# Patient Record
Sex: Male | Born: 1980 | Race: White | Hispanic: No | Marital: Single | Smoking: Former smoker
Health system: Southern US, Community
[De-identification: ages and names within clinical notes are randomized; demographics above are authoritative.]

## PROBLEM LIST (undated history)

## (undated) DIAGNOSIS — L02415 Cutaneous abscess of right lower limb: Secondary | ICD-10-CM

## (undated) DIAGNOSIS — I5032 Chronic diastolic (congestive) heart failure: Secondary | ICD-10-CM

## (undated) DIAGNOSIS — I10 Essential (primary) hypertension: Secondary | ICD-10-CM

## (undated) HISTORY — DX: Cutaneous abscess of right lower limb: L02.415

## (undated) HISTORY — DX: Morbid (severe) obesity due to excess calories: E66.01

## (undated) HISTORY — DX: Chronic diastolic (congestive) heart failure: I50.32

---
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
## 2001-05-30 ENCOUNTER — Emergency Department (HOSPITAL_COMMUNITY): Admission: EM | Admit: 2001-05-30 | Discharge: 2001-05-30 | Payer: Self-pay | Admitting: Emergency Medicine

## 2004-01-31 ENCOUNTER — Emergency Department (HOSPITAL_COMMUNITY): Admission: EM | Admit: 2004-01-31 | Discharge: 2004-02-01 | Payer: Self-pay | Admitting: Emergency Medicine

## 2008-01-11 ENCOUNTER — Emergency Department (HOSPITAL_COMMUNITY): Admission: EM | Admit: 2008-01-11 | Discharge: 2008-01-11 | Payer: Self-pay | Admitting: Family Medicine

## 2011-06-07 ENCOUNTER — Emergency Department
Admission: EM | Admit: 2011-06-07 | Discharge: 2011-06-07 | Disposition: A | Discharge status: Home / Self Care | Payer: BC Managed Care – PPO | Source: Home / Self Care | Attending: Emergency Medicine | Admitting: Emergency Medicine

## 2011-06-07 DIAGNOSIS — S335XXA Sprain of ligaments of lumbar spine, initial encounter: Secondary | ICD-10-CM

## 2011-06-07 DIAGNOSIS — S39012A Strain of muscle, fascia and tendon of lower back, initial encounter: Secondary | ICD-10-CM

## 2011-06-07 DIAGNOSIS — M545 Low back pain, unspecified: Secondary | ICD-10-CM

## 2011-06-07 HISTORY — DX: Essential (primary) hypertension: I10

## 2011-06-07 MED ORDER — CYCLOBENZAPRINE HCL 10 MG PO TABS: 10.0000 mg | ORAL_TABLET | Freq: Three times a day (TID) | ORAL | Status: AC | PRN

## 2011-06-07 MED ORDER — TRAMADOL HCL 50 MG PO TABS: 50.0000 mg | ORAL_TABLET | Freq: Four times a day (QID) | ORAL | Status: AC | PRN

## 2011-06-07 NOTE — ED Notes (Signed)
Lower back pain started yesterday

## 2011-06-07 NOTE — ED Provider Notes (Signed)
History     CSN: [id number]  Arrival date & time 06/07/11  1625   None     Chief Complaint  Patient presents with  . Back Pain    (Consider location/radiation/quality/duration/timing/severity/associated sxs/prior treatment) HPI The patient presents today with back pain. He is currently about 530 pounds but has lost about 110 pounds in the last year. He is also training to walk a 5K and is currently walking between one and 2 miles per day which is a lot more than usual for him. He is not having any upper respiratory symptoms. He is not having any chest pain or shortness of breath. No left-sided discomfort. Location: mid right back Timing: constant, but worse with certain movements, bending over Description: tight, spasm, sharp Better with: rest, standing straight Trauma: no Bladder/bowel incontinence: no Weakness: no Fever/chills: no Night pain: no Unexplained weight loss: no Cancer/immunosuppression: no PMH of osteoporosis or chronic steroid use:  no   Past Medical History  Diagnosis Date  . Hypertension     History reviewed. No pertinent past surgical history.  History reviewed. No pertinent family history.  History  Substance Use Topics  . Smoking status: Not on file  . Smokeless tobacco: Not on file  . Alcohol Use:       Review of Systems  Allergies  Review of patient's allergies indicates no known allergies.  Home Medications   Current Outpatient Rx  Name Route Sig Dispense Refill  . VITAMIN D 1000 UNITS PO TABS Oral Take 1,000 Units by mouth daily.    . FUROSEMIDE 20 MG PO TABS Oral Take 20 mg by mouth 2 (two) times daily.    Marland Kitchen LOSARTAN POTASSIUM 100 MG PO TABS Oral Take 100 mg by mouth daily.    . CYCLOBENZAPRINE HCL 10 MG PO TABS Oral Take 1 tablet (10 mg total) by mouth 3 (three) times daily as needed for muscle spasms. 24 tablet 0  . TRAMADOL HCL 50 MG PO TABS Oral Take 1 tablet (50 mg total) by mouth every 6 (six) hours as needed for pain. 27  tablet 0    BP 160/100  Pulse 125  Temp(Src) 98.6 F (37 C) (Oral)  Resp 24  Ht 6\' 2"  (1.88 m)  Wt 530 lb (240.406 kg)  BMI 68.05 kg/m2  SpO2 98%  Physical Exam  Nursing note and vitals reviewed. Constitutional: He is oriented to person, place, and time. He appears well-developed and well-nourished.  HENT:  Head: Normocephalic and atraumatic.  Eyes: No scleral icterus.  Neck: Neck supple.  Cardiovascular: Regular rhythm and normal heart sounds.   Pulmonary/Chest: Effort normal and breath sounds normal. No respiratory distress.  Musculoskeletal:       Thoracic back: He exhibits tenderness (Minimal right-sided), pain and spasm (minimal right-sided). He exhibits normal range of motion, no bony tenderness, no swelling and no edema.  Neurological: He is alert and oriented to person, place, and time. He has normal strength and normal reflexes. No sensory deficit.  Skin: Skin is warm and dry. No rash noted.  Psychiatric: He has a normal mood and affect. His speech is normal.    ED Course  Procedures (including critical care time)  Labs Reviewed - No data to display No results found.   1. Pain in lower back   2. Lumbar strain       MDM   I think his back pain is likely due to a muscle strain. This is likely secondary to his increased exercise regimen. We  discussed this but I have encouraged him to continue his exercise regimen because in the long run his. He weight will likely cause more problems. I've given him a prescription for Flexeril and tramadol to take for the next few days. I've also advised he go buy a heating pad and use it. I do not see any cause or reason to get an x-ray at this time, either of his back or on his chest. If his pain continues or is not improving, then we will likely send to sports medicine doctor or get a chest x-ray.  Lily Kocher, MD 06/07/11 [phone]

## 2012-04-19 DIAGNOSIS — E559 Vitamin D deficiency, unspecified: Secondary | ICD-10-CM | POA: Insufficient documentation

## 2012-08-03 DIAGNOSIS — I10 Essential (primary) hypertension: Secondary | ICD-10-CM | POA: Diagnosis present

## 2012-08-03 DIAGNOSIS — E669 Obesity, unspecified: Secondary | ICD-10-CM | POA: Diagnosis present

## 2012-08-20 ENCOUNTER — Other Ambulatory Visit (HOSPITAL_COMMUNITY): Payer: Self-pay | Admitting: Family Medicine

## 2012-08-25 ENCOUNTER — Other Ambulatory Visit (HOSPITAL_COMMUNITY): Payer: BC Managed Care – PPO

## 2012-08-31 ENCOUNTER — Ambulatory Visit (HOSPITAL_COMMUNITY)
Admission: RE | Admit: 2012-08-31 | Discharge: 2012-08-31 | Disposition: A | Discharge status: Home / Self Care | Payer: BC Managed Care – PPO | Source: Ambulatory Visit | Attending: Family Medicine | Admitting: Family Medicine

## 2012-08-31 DIAGNOSIS — Z01818 Encounter for other preprocedural examination: Secondary | ICD-10-CM | POA: Insufficient documentation

## 2012-12-21 ENCOUNTER — Other Ambulatory Visit (HOSPITAL_COMMUNITY): Payer: Self-pay | Admitting: Family Medicine

## 2012-12-21 ENCOUNTER — Emergency Department
Admission: EM | Admit: 2012-12-21 | Discharge: 2012-12-21 | Disposition: A | Discharge status: Home / Self Care | Payer: BC Managed Care – PPO | Source: Home / Self Care | Attending: Family Medicine | Admitting: Family Medicine

## 2012-12-21 ENCOUNTER — Encounter: Payer: Self-pay | Admitting: *Deleted

## 2012-12-21 DIAGNOSIS — R109 Unspecified abdominal pain: Secondary | ICD-10-CM

## 2012-12-21 DIAGNOSIS — R300 Dysuria: Secondary | ICD-10-CM

## 2012-12-21 LAB — POCT URINALYSIS DIP (MANUAL ENTRY)
Blood, UA: NEGATIVE
Ketones, POC UA: NEGATIVE
Protein Ur, POC: NEGATIVE
Spec Grav, UA: 1.025 (ref 1.005–1.03)
pH, UA: 6.5 (ref 5–8)

## 2012-12-21 MED ORDER — CEPHALEXIN 500 MG PO CAPS: 500.0000 mg | ORAL_CAPSULE | Freq: Two times a day (BID) | ORAL | Status: DC

## 2012-12-21 NOTE — ED Notes (Signed)
Pt c/o dysuria and urinary frequency x 1day. Denies fever.  He reports having a UTI vs. Kidney stone x 2wks ago and was treated by his PCP with rapiflow and cephalexin.

## 2012-12-21 NOTE — ED Provider Notes (Signed)
CSN: [id number]     Arrival date & time 12/21/12  1628 History   First MD Initiated Contact with Patient 12/21/12 1656     Chief Complaint  Patient presents with  . Dysuria      HPI Comments: Patient complains of mild dysuria/urgency that started yesterday.  No urethral discharge or testicular pain.  No abdominal pain.  He has had vague intermittent right flank discomfort.  No fevers, chills, and sweats.  Three weeks ago he developed similar but worse symptoms with nocturia.  He visited his PCP, who noted that his urinalysis was normal.  He was started on Keflex 500mg  BID and tamsulosin.  His symptoms improved within one day and resolved within 5 days.  His PCP had recommended an abdominal/pelvic CT scan for further evaluation.  Patient notes that he has modified his diet extensively, and has lost about 50 pounds.  He plans to be out of town for the next 5 days.                                                       Patient is a 32 y.o. male presenting with dysuria. The history is provided by the patient.  Dysuria This is a recurrent problem. The current episode started yesterday. The problem occurs constantly. The problem has been gradually worsening. Pertinent negatives include no chest pain and no abdominal pain. Nothing aggravates the symptoms. Nothing relieves the symptoms. He has tried nothing for the symptoms.    Past Medical History  Diagnosis Date  . Hypertension    History reviewed. No pertinent past surgical history. Family History  Problem Relation Age of Onset  . Bowel Disease Father     colon blockage   History  Substance Use Topics  . Smoking status: Former Games developer  . Smokeless tobacco: Not on file  . Alcohol Use: No    Review of Systems  Cardiovascular: Negative for chest pain.  Gastrointestinal: Negative for abdominal pain.  Genitourinary: Positive for dysuria.  All other systems reviewed and are negative.    Allergies  Sulfa antibiotics  Home Medications    Current Outpatient Rx  Name  Route  Sig  Dispense  Refill  . cephALEXin (KEFLEX) 500 MG capsule   Oral   Take 1 capsule (500 mg total) by mouth 2 (two) times daily.   14 capsule   0   . cholecalciferol (VITAMIN D) 1000 UNITS tablet   Oral   Take 1,000 Units by mouth daily.         . furosemide (LASIX) 20 MG tablet   Oral   Take 20 mg by mouth 2 (two) times daily.         Marland Kitchen losartan (COZAAR) 100 MG tablet   Oral   Take 100 mg by mouth daily.          BP 157/92  Pulse 111  Temp(Src) 98.5 F (36.9 C) (Oral)  Resp 18  Wt 515 lb (233.602 kg)  BMI 66.09 kg/m2  SpO2 100% Physical Exam Nursing notes and Vital Signs reviewed. Appearance:  Patient appears stated age, and in no acute distress.  Patient is morbidly obese (BMI 66.1) Eyes:  Pupils are equal, round, and reactive to light and accomodation.  Extraocular movement is intact.  Conjunctivae are not inflamed  Ears:  Canals normal.  Tympanic membranes normal.  Nose:   Normal turbinates.  Marland Kitchen  Pharynx:  Normal Neck:  Supple.  No adenopathy  Lungs:  Clear to auscultation.  Breath sounds are equal.  Heart:  Regular rate and rhythm without murmurs, rubs, or gallops.  Abdomen:  Nontender without masses or hepatosplenomegaly.  Bowel sounds are present.  No CVA or flank tenderness.  Extremities:  Trace edema.  No calf tenderness Skin:  No rash present.   ED Course  Procedures  none    Labs Reviewed  URINE CULTURE  POCT URINALYSIS DIP (MANUAL ENTRY) negative       MDM   1. Dysuria    Urine culture pending.  Begin Keflex 500mg  BID Increase fluid intake. Red flags discussed. Upon return from trip, followup with PCP for CT scan abdomen/pelvis.    Lattie Haw, MD 12/21/12 [phone]

## 2012-12-23 ENCOUNTER — Ambulatory Visit (HOSPITAL_COMMUNITY): Payer: BC Managed Care – PPO

## 2012-12-23 ENCOUNTER — Telehealth: Payer: Self-pay | Admitting: *Deleted

## 2012-12-23 LAB — URINE CULTURE: Colony Count: NO GROWTH

## 2013-04-12 ENCOUNTER — Emergency Department
Admission: EM | Admit: 2013-04-12 | Discharge: 2013-04-12 | Disposition: A | Discharge status: Home / Self Care | Payer: BC Managed Care – PPO | Source: Home / Self Care | Attending: Emergency Medicine | Admitting: Emergency Medicine

## 2013-04-12 ENCOUNTER — Encounter: Payer: Self-pay | Admitting: Emergency Medicine

## 2013-04-12 DIAGNOSIS — J111 Influenza due to unidentified influenza virus with other respiratory manifestations: Secondary | ICD-10-CM

## 2013-04-12 MED ORDER — OSELTAMIVIR PHOSPHATE 75 MG PO CAPS: ORAL_CAPSULE | ORAL | Status: DC

## 2013-04-12 MED ORDER — PROMETHAZINE-CODEINE 6.25-10 MG/5ML PO SYRP: ORAL_SOLUTION | ORAL | Status: DC

## 2013-04-12 NOTE — ED Notes (Signed)
Tymel c/o onset fever, body aches and productive cough x early this AM. He has had a flu vaccine this season.

## 2013-04-12 NOTE — ED Provider Notes (Signed)
CSN: [id number]     Arrival date & time 04/12/13  0903 History   First MD Initiated Contact with Patient 04/12/13 [phone]     Chief Complaint  Patient presents with  . Fever  . Cough    Patient is a 32 y.o. male presenting with cough. The history is provided by the patient.  Cough FLU  HPI : Flu symptoms for  6 hrs.-- acute onset "feels like Ive been hit by a truck"  Fever to 102 with chills, sweats, myalgias, fatigue, headache. Symptoms are progressively worsening, despite trying OTC fever reducing medicine and rest and fluids. Has decreased appetite, but tolerating some liquids by mouth. No history of recent tick bite.  Review of Systems: Positive for fatigue, mild nasal congestion, mild sore throat, mild swollen anterior neck glands, hacking cough. Negative for acute vision changes, stiff neck, focal weakness, syncope, seizures, respiratory distress, vomiting, diarrhea, GU symptoms, new Rash.  Past Medical History  Diagnosis Date  . Hypertension    History reviewed. No pertinent past surgical history. Family History  Problem Relation Age of Onset  . Bowel Disease Father     colon blockage   History  Substance Use Topics  . Smoking status: Former Smoker    Quit date: 04/13/2011  . Smokeless tobacco: Never Used  . Alcohol Use: No    Review of Systems  Respiratory: Positive for cough.     Allergies  Sulfa antibiotics  Home Medications   Current Outpatient Rx  Name  Route  Sig  Dispense  Refill  . cholecalciferol (VITAMIN D) 1000 UNITS tablet   Oral   Take 1,000 Units by mouth daily.         . furosemide (LASIX) 20 MG tablet   Oral   Take 20 mg by mouth 2 (two) times daily.         Marland Kitchen losartan (COZAAR) 100 MG tablet   Oral   Take 100 mg by mouth daily.         Marland Kitchen oseltamivir (TAMIFLU) 75 MG capsule      Starting today, take 1 capsule by mouth twice a day for 5 days.   10 capsule   0   . promethazine-codeine (PHENERGAN WITH CODEINE) 6.25-10 MG/5ML  syrup      Take 1-2 teaspoons every 4-6 hours as needed for cough. May cause drowsiness.   120 mL   0    BP 145/81  Pulse 116  Temp(Src) 101.2 F (38.4 C) (Oral)  Resp 16  Ht 6\' 3"  (1.905 m)  Wt 490 lb (222.263 kg)  BMI 61.25 kg/m2  SpO2 97% Physical Exam  Nursing note and vitals reviewed. Constitutional: He appears well-developed and well-nourished.  Non-toxic appearance. He appears ill (very fatigued, but no cardiorespiratory distress). No distress.  HENT:  Head: Normocephalic and atraumatic.  Right Ear: Tympanic membrane and external ear normal.  Left Ear: Tympanic membrane and external ear normal.  Nose: Rhinorrhea present.  Mouth/Throat: Mucous membranes are normal. Posterior oropharyngeal erythema (mild redness ) present. No oropharyngeal exudate.  Eyes: Conjunctivae are normal. Right eye exhibits no discharge. Left eye exhibits no discharge. No scleral icterus.  Neck: Neck supple.  Cardiovascular: Normal rate, regular rhythm and normal heart sounds.   Pulmonary/Chest: Breath sounds normal. No stridor. No respiratory distress. He has no wheezes. He has no rales. He exhibits tenderness (mild, diffuse chest wall tenderness, intercostal muscles, but no rib or sternal tenderness).  Abdominal: Soft. There is no tenderness.  Musculoskeletal: He exhibits  no edema.  Lymphadenopathy:    He has cervical adenopathy (mild shoddy anterior cervical nodes).  Neurological: He is alert.  Skin: Skin is warm and intact. No rash noted. He is diaphoretic.  Psychiatric: He has a normal mood and affect.    ED Course  Procedures (including critical care time) Labs Review Labs Reviewed - No data to display Imaging Review No results found.  EKG Interpretation    Date/Time:    Ventricular Rate:    PR Interval:    QRS Duration:   QT Interval:    QTC Calculation:   R Axis:     Text Interpretation:              MDM   1. Influenza with respiratory manifestations    He has  classical influenza by history and exam. He declined doing flu testing, and I agree that since we are in the middle of a influenza epidemic, treat with Tamiflu 75 twice a day x5 days. Other symptomatic care discussed. Fever reduction methods discussed. Promethazine with codeine, 1 or 2 teaspoons at bedtime when necessary severe cough. Note written to excuse from work for 5 days. Clinically, there is no sign of bacterial infection, but red flags discussed, and advised followup here or with PCP if not improving in 7 days, sooner if worse or new symptoms  Precautions discussed. Red flags discussed. Questions invited and answered. Patient voiced understanding and agreement.     Lajean Manes, MD 04/12/13 [phone]

## 2013-05-02 DIAGNOSIS — Z6841 Body Mass Index (BMI) 40.0 and over, adult: Secondary | ICD-10-CM | POA: Insufficient documentation

## 2016-05-12 DIAGNOSIS — J029 Acute pharyngitis, unspecified: Secondary | ICD-10-CM | POA: Insufficient documentation

## 2019-07-07 ENCOUNTER — Other Ambulatory Visit: Payer: Self-pay | Admitting: Family Medicine

## 2019-07-07 ENCOUNTER — Other Ambulatory Visit (HOSPITAL_COMMUNITY): Payer: Self-pay | Admitting: Family Medicine

## 2019-07-07 DIAGNOSIS — R1903 Right lower quadrant abdominal swelling, mass and lump: Secondary | ICD-10-CM

## 2019-07-07 DIAGNOSIS — L0291 Cutaneous abscess, unspecified: Secondary | ICD-10-CM

## 2019-07-08 ENCOUNTER — Ambulatory Visit (HOSPITAL_COMMUNITY): Payer: Self-pay

## 2019-07-08 ENCOUNTER — Encounter (HOSPITAL_COMMUNITY): Payer: Self-pay

## 2019-10-26 ENCOUNTER — Other Ambulatory Visit: Payer: Self-pay | Admitting: Family Medicine

## 2019-10-26 DIAGNOSIS — R140 Abdominal distension (gaseous): Secondary | ICD-10-CM

## 2019-11-08 ENCOUNTER — Ambulatory Visit
Admission: RE | Admit: 2019-11-08 | Discharge: 2019-11-08 | Disposition: A | Discharge status: Home / Self Care | Payer: BC Managed Care – PPO | Source: Ambulatory Visit | Attending: Family Medicine | Admitting: Family Medicine

## 2019-11-08 DIAGNOSIS — R140 Abdominal distension (gaseous): Secondary | ICD-10-CM

## 2019-11-08 MED ORDER — IOPAMIDOL (ISOVUE-300) INJECTION 61%: 125.0000 mL | Freq: Once | INTRAVENOUS | Status: DC | PRN

## 2020-08-06 DIAGNOSIS — L0390 Cellulitis, unspecified: Secondary | ICD-10-CM | POA: Diagnosis present

## 2020-08-30 DIAGNOSIS — E781 Pure hyperglyceridemia: Secondary | ICD-10-CM | POA: Insufficient documentation

## 2020-08-30 DIAGNOSIS — E8881 Metabolic syndrome: Secondary | ICD-10-CM | POA: Insufficient documentation

## 2021-05-29 ENCOUNTER — Encounter (HOSPITAL_COMMUNITY): Payer: Self-pay

## 2021-05-29 ENCOUNTER — Emergency Department (HOSPITAL_COMMUNITY): Payer: Managed Care, Other (non HMO)

## 2021-05-29 ENCOUNTER — Inpatient Hospital Stay (HOSPITAL_COMMUNITY)
Admission: EM | Admit: 2021-05-29 | Discharge: 2021-06-03 | DRG: 291 | Disposition: A | Discharge status: Home / Self Care | Payer: Managed Care, Other (non HMO) | Source: Ambulatory Visit | Attending: Internal Medicine | Admitting: Internal Medicine

## 2021-05-29 ENCOUNTER — Encounter (HOSPITAL_COMMUNITY): Payer: Managed Care, Other (non HMO)

## 2021-05-29 DIAGNOSIS — R32 Unspecified urinary incontinence: Secondary | ICD-10-CM | POA: Diagnosis present

## 2021-05-29 DIAGNOSIS — Z8249 Family history of ischemic heart disease and other diseases of the circulatory system: Secondary | ICD-10-CM

## 2021-05-29 DIAGNOSIS — I10 Essential (primary) hypertension: Secondary | ICD-10-CM | POA: Diagnosis not present

## 2021-05-29 DIAGNOSIS — Z87891 Personal history of nicotine dependence: Secondary | ICD-10-CM

## 2021-05-29 DIAGNOSIS — R0602 Shortness of breath: Secondary | ICD-10-CM

## 2021-05-29 DIAGNOSIS — Z6841 Body Mass Index (BMI) 40.0 and over, adult: Secondary | ICD-10-CM | POA: Diagnosis not present

## 2021-05-29 DIAGNOSIS — J9601 Acute respiratory failure with hypoxia: Secondary | ICD-10-CM | POA: Diagnosis present

## 2021-05-29 DIAGNOSIS — Z79899 Other long term (current) drug therapy: Secondary | ICD-10-CM | POA: Diagnosis not present

## 2021-05-29 DIAGNOSIS — I517 Cardiomegaly: Secondary | ICD-10-CM

## 2021-05-29 DIAGNOSIS — I110 Hypertensive heart disease with heart failure: Principal | ICD-10-CM | POA: Diagnosis present

## 2021-05-29 DIAGNOSIS — R609 Edema, unspecified: Secondary | ICD-10-CM | POA: Diagnosis not present

## 2021-05-29 DIAGNOSIS — Z20822 Contact with and (suspected) exposure to covid-19: Secondary | ICD-10-CM | POA: Diagnosis present

## 2021-05-29 DIAGNOSIS — Z9989 Dependence on other enabling machines and devices: Secondary | ICD-10-CM | POA: Diagnosis not present

## 2021-05-29 DIAGNOSIS — Z882 Allergy status to sulfonamides status: Secondary | ICD-10-CM

## 2021-05-29 DIAGNOSIS — R7303 Prediabetes: Secondary | ICD-10-CM | POA: Diagnosis present

## 2021-05-29 DIAGNOSIS — E669 Obesity, unspecified: Secondary | ICD-10-CM | POA: Diagnosis present

## 2021-05-29 DIAGNOSIS — R7989 Other specified abnormal findings of blood chemistry: Secondary | ICD-10-CM | POA: Diagnosis present

## 2021-05-29 DIAGNOSIS — I5031 Acute diastolic (congestive) heart failure: Secondary | ICD-10-CM | POA: Diagnosis present

## 2021-05-29 DIAGNOSIS — G4733 Obstructive sleep apnea (adult) (pediatric): Secondary | ICD-10-CM | POA: Diagnosis present

## 2021-05-29 DIAGNOSIS — I739 Peripheral vascular disease, unspecified: Secondary | ICD-10-CM | POA: Diagnosis present

## 2021-05-29 DIAGNOSIS — E8770 Fluid overload, unspecified: Secondary | ICD-10-CM | POA: Diagnosis present

## 2021-05-29 LAB — CBC WITH DIFFERENTIAL/PLATELET
Abs Immature Granulocytes: 0.03 K/uL (ref 0.00–0.07)
Basophils Absolute: 0 K/uL (ref 0.0–0.1)
Basophils Relative: 0 %
Eosinophils Absolute: 0.1 K/uL (ref 0.0–0.5)
Eosinophils Relative: 2 %
HCT: 46.3 % (ref 39.0–52.0)
Hemoglobin: 13.9 g/dL (ref 13.0–17.0)
Immature Granulocytes: 0 %
Lymphocytes Relative: 15 %
Lymphs Abs: 1.3 K/uL (ref 0.7–4.0)
MCH: 29.2 pg (ref 26.0–34.0)
MCHC: 30 g/dL (ref 30.0–36.0)
MCV: 97.3 fL (ref 80.0–100.0)
Monocytes Absolute: 0.5 K/uL (ref 0.1–1.0)
Monocytes Relative: 7 %
Neutro Abs: 6.3 K/uL (ref 1.7–7.7)
Neutrophils Relative %: 76 %
Platelets: 239 K/uL (ref 150–400)
RBC: 4.76 MIL/uL (ref 4.22–5.81)
RDW: 16 % — ABNORMAL HIGH (ref 11.5–15.5)
WBC: 8.2 K/uL (ref 4.0–10.5)
nRBC: 0 % (ref 0.0–0.2)

## 2021-05-29 LAB — COMPREHENSIVE METABOLIC PANEL WITH GFR
ALT: 55 U/L — ABNORMAL HIGH (ref 0–44)
AST: 35 U/L (ref 15–41)
Albumin: 3.2 g/dL — ABNORMAL LOW (ref 3.5–5.0)
Alkaline Phosphatase: 69 U/L (ref 38–126)
Anion gap: 3 — ABNORMAL LOW (ref 5–15)
BUN: 18 mg/dL (ref 6–20)
CO2: 35 mmol/L — ABNORMAL HIGH (ref 22–32)
Calcium: 8.6 mg/dL — ABNORMAL LOW (ref 8.9–10.3)
Chloride: 101 mmol/L (ref 98–111)
Creatinine, Ser: 0.68 mg/dL (ref 0.61–1.24)
GFR, Estimated: >60 mL/min
Glucose, Bld: 109 mg/dL — ABNORMAL HIGH (ref 70–99)
Potassium: 4.2 mmol/L (ref 3.5–5.1)
Sodium: 139 mmol/L (ref 135–145)
Total Bilirubin: 0.5 mg/dL (ref 0.3–1.2)
Total Protein: 6.8 g/dL (ref 6.5–8.1)

## 2021-05-29 LAB — HIV ANTIBODY (ROUTINE TESTING W REFLEX): HIV Screen 4th Generation wRfx: NONREACTIVE

## 2021-05-29 LAB — RESP PANEL BY RT-PCR (FLU A&B, COVID) ARPGX2
Influenza A by PCR: NEGATIVE
Influenza B by PCR: NEGATIVE
SARS Coronavirus 2 by RT PCR: NEGATIVE

## 2021-05-29 LAB — TROPONIN I (HIGH SENSITIVITY)
Troponin I (High Sensitivity): 10 ng/L (ref <18)
Troponin I (High Sensitivity): 10 ng/L

## 2021-05-29 LAB — BRAIN NATRIURETIC PEPTIDE: B Natriuretic Peptide: 23 pg/mL (ref 0.0–100.0)

## 2021-05-29 LAB — D-DIMER, QUANTITATIVE: D-Dimer, Quant: 0.9 ug/mL-FEU — ABNORMAL HIGH (ref 0.00–0.50)

## 2021-05-29 MED ORDER — HYDRALAZINE HCL 25 MG PO TABS: 25.0000 mg | ORAL_TABLET | Freq: Four times a day (QID) | ORAL | Status: DC | PRN

## 2021-05-29 MED ORDER — ACETAMINOPHEN 325 MG PO TABS
650.0000 mg | ORAL_TABLET | Freq: Four times a day (QID) | ORAL | Status: DC | PRN
  Administered 2021-05-30: 650 mg via ORAL
  Filled 2021-05-29: qty 2

## 2021-05-29 MED ORDER — ENOXAPARIN SODIUM 40 MG/0.4ML IJ SOSY
40.0000 mg | PREFILLED_SYRINGE | Freq: Two times a day (BID) | INTRAMUSCULAR | Status: DC
  Administered 2021-05-29 – 2021-05-31 (×4): 40 mg via SUBCUTANEOUS
  Filled 2021-05-29 (×4): qty 0.4

## 2021-05-29 MED ORDER — ACETAMINOPHEN 650 MG RE SUPP: 650.0000 mg | Freq: Four times a day (QID) | RECTAL | Status: DC | PRN

## 2021-05-29 MED ORDER — SENNOSIDES-DOCUSATE SODIUM 8.6-50 MG PO TABS: 1.0000 | ORAL_TABLET | Freq: Every evening | ORAL | Status: DC | PRN

## 2021-05-29 MED ORDER — ONDANSETRON HCL 4 MG PO TABS: 4.0000 mg | ORAL_TABLET | Freq: Four times a day (QID) | ORAL | Status: DC | PRN

## 2021-05-29 MED ORDER — LOSARTAN POTASSIUM 50 MG PO TABS
100.0000 mg | ORAL_TABLET | Freq: Every day | ORAL | Status: DC
  Administered 2021-05-30 – 2021-06-03 (×5): 100 mg via ORAL
  Filled 2021-05-29: qty 2
  Filled 2021-05-29: qty 4
  Filled 2021-05-29 (×3): qty 2

## 2021-05-29 MED ORDER — ONDANSETRON HCL 4 MG/2ML IJ SOLN: 4.0000 mg | Freq: Four times a day (QID) | INTRAMUSCULAR | Status: DC | PRN

## 2021-05-29 MED ORDER — FUROSEMIDE 10 MG/ML IJ SOLN
60.0000 mg | Freq: Once | INTRAMUSCULAR | Status: AC
  Administered 2021-05-29: 60 mg via INTRAVENOUS
  Filled 2021-05-29: qty 8

## 2021-05-29 MED ORDER — FUROSEMIDE 10 MG/ML IJ SOLN
60.0000 mg | Freq: Two times a day (BID) | INTRAMUSCULAR | Status: DC
  Administered 2021-05-29 – 2021-06-03 (×10): 60 mg via INTRAVENOUS
  Filled 2021-05-29: qty 6
  Filled 2021-05-29 (×2): qty 8
  Filled 2021-05-29 (×2): qty 6
  Filled 2021-05-29: qty 8
  Filled 2021-05-29 (×4): qty 6

## 2021-05-29 MED ORDER — FUROSEMIDE 10 MG/ML IJ SOLN: 60.0000 mg | Freq: Two times a day (BID) | INTRAMUSCULAR | Status: DC

## 2021-05-29 NOTE — ED Notes (Signed)
Pt bed linens wets from displaced purewick; pt gown and linens changed and new purewick placed; pt encouraged to call for assistance if needed

## 2021-05-29 NOTE — ED Triage Notes (Signed)
Pt BIBA from dr office where he was satting in the 80s on room air. Seen 1 week ago for [facility], covid/flu neg. Reports increasing SHOB while ambulating and lying flat. Noticed increased fluid in abdomen and legs. Takes 20mg  of lasix daily. Denies chest pain.  BP 170/90 Hx HTN HR 90 SpO2 80s RA 97% 3L [place]

## 2021-05-29 NOTE — Hospital Course (Addendum)
41 year old male with super morbid obesity BMI 84, OSA on cpap-compliant,history of hypertension presented hospital with shortness of breath and hypoxia with worsening swelling of his lower extremities and weight gain.  He was seen in his PCPs office and was noted to be hypoxic with pulse ox of 80% on room air complained of orthopnea.  In the ED, patient was noted to be hypertensive up to 205/113, oxygen saturation improved to 91 to 100% on 3 L nasal cannula. CXR showed cardiomegaly with interstitial densities.  EKG showed sinus tachycardia.  Lasix IV was given and patient was admitted to hospital for further evaluation and treatment.  The following conditions were addressed during hospitalization,

## 2021-05-29 NOTE — ED Provider Notes (Signed)
Emergency Department Provider Note   I have reviewed the triage vital signs and the nursing notes.   HISTORY  Chief Complaint Shortness of Breath   HPI Willie Steele is a 41 y.o. male with PMH of HTN and obesity presents to the ED with SOB and hypoxemia.  Patient arrives by EMS from his PCP office.  He arrived there and was found to be 79% on room air.  Patient states that he has no prior history of heart failure.  He has been taking Lasix since he was in his 62s for occasional fluid in the legs but that over the past week his legs and abdomen have swollen significantly.  He states they feel tense and hard.  He is short of breath even with minimal ambulation. Today he found he could not fit into his car due to his increased girth. Denies fever/chills. EMS started on 3L  and transported to the ED.   Patient does note that over the past several days he has had some intermittent "electric" type pain radiating from the center of his chest. Pain is not worse with deep breathing, exertion, or other activities. No active pain at this time.    Past Medical History:  Diagnosis Date   Hypertension     Review of Systems  Constitutional: No fever/chills Eyes: No visual changes. ENT: No sore throat. Cardiovascular: Denies pleuritic chest pain. Positive B/L LE edema and abdominal swelling.  Respiratory: Positive shortness of breath. Gastrointestinal: No abdominal pain.  No nausea, no vomiting.  No diarrhea.  No constipation. Genitourinary: Negative for dysuria. Musculoskeletal: Negative for back pain. Skin: Negative for rash. Neurological: Negative for headaches, focal weakness or numbness.   ____________________________________________   PHYSICAL EXAM:  VITAL SIGNS: ED Triage Vitals  Enc Vitals Group     BP 05/29/21 1458 (!) 119/112     Pulse Rate 05/29/21 1458 (!) 106     Resp 05/29/21 1458 (!) 21     Temp 05/29/21 1458 98.6 F (37 C)     Temp Source 05/29/21 1458 Oral      SpO2 05/29/21 1458 100 %     Weight 05/29/21 1455 (!) 680 lb (308.4 kg)     Height 05/29/21 1455 6\' 3"  (1.905 m)   Constitutional: Alert and oriented. Well appearing and in no acute distress. Eyes: Conjunctivae are normal.  Head: Atraumatic. Nose: No congestion/rhinnorhea. Mouth/Throat: Mucous membranes are moist.  Neck: No stridor.  Cardiovascular: Tachycardia. Good peripheral circulation. Grossly normal heart sounds.   Respiratory: Normal respiratory effort.  No retractions. Lungs distant with crackles at the bases.  Gastrointestinal: Soft and nontender. Positive distension with pitting edema to the abdomen.  Musculoskeletal: No gross deformities of extremities. Bilateral, severe pitting edema in the legs.  Neurologic:  Normal speech and language. Skin:  Skin is warm, dry and intact. No rash noted.   ____________________________________________   LABS (all labs ordered are listed, but only abnormal results are displayed)  Labs Reviewed  COMPREHENSIVE METABOLIC PANEL - Abnormal; Notable for the following components:      Result Value   CO2 35 (*)    Glucose, Bld 109 (*)    Calcium 8.6 (*)    Albumin 3.2 (*)    ALT 55 (*)    Anion gap 3 (*)    All other components within normal limits  CBC WITH DIFFERENTIAL/PLATELET - Abnormal; Notable for the following components:   RDW 16.0 (*)    All other components within normal limits  D-DIMER, QUANTITATIVE - Abnormal; Notable for the following components:   D-Dimer, Quant 0.90 (*)    All other components within normal limits  COMPREHENSIVE METABOLIC PANEL - Abnormal; Notable for the following components:   CO2 34 (*)    Glucose, Bld 112 (*)    Calcium 8.5 (*)    Total Protein 6.3 (*)    Albumin 3.0 (*)    ALT 47 (*)    All other components within normal limits  CBC - Abnormal; Notable for the following components:   MCHC 29.2 (*)    RDW 16.0 (*)    All other components within normal limits  LIPID PANEL - Abnormal; Notable  for the following components:   HDL 25 (*)    All other components within normal limits  RESP PANEL BY RT-PCR (FLU A&B, COVID) ARPGX2  BRAIN NATRIURETIC PEPTIDE  HIV ANTIBODY (ROUTINE TESTING W REFLEX)  BRAIN NATRIURETIC PEPTIDE  HEMOGLOBIN A1C  TSH  TROPONIN I (HIGH SENSITIVITY)  TROPONIN I (HIGH SENSITIVITY)   ____________________________________________  EKG   EKG Interpretation  Date/Time:  Wednesday May 29 2021 14:57:46 EST Ventricular Rate:  104 PR Interval:  197 QRS Duration: 114 QT Interval:  360 QTC Calculation: 474 R Axis:   50 Text Interpretation: Sinus tachycardia Borderline prolonged PR interval Borderline intraventricular conduction delay Low voltage, precordial leads Nonspecific T abnormalities, lateral leads Similar to 2005 tracing Confirmed by Nanda Quinton [phone]) on 05/29/2021 3:36:28 PM        ____________________________________________  RADIOLOGY  DG Chest 1 View  Result Date: 05/29/2021 CLINICAL DATA:  Shortness of breath.  Lower extremity swelling. EXAM: CHEST  1 VIEW COMPARISON:  01/31/2004 FINDINGS: The cardiac silhouette is mildly enlarged. There is pulmonary vascular congestion with diffusely increased interstitial markings in both lungs. No focal airspace consolidation, large pleural effusion, or pneumothorax is identified. IMPRESSION: Cardiomegaly and bilateral interstitial densities, likely edema Electronically Signed   By: Logan Bores M.D.   On: 05/29/2021 15:58    ____________________________________________   PROCEDURES  Procedure(s) performed:   Procedures  CRITICAL CARE Performed by: Margette Fast Total critical care time: 35 minutes Critical care time was exclusive of separately billable procedures and treating other patients. Critical care was necessary to treat or prevent imminent or life-threatening deterioration. Critical care was time spent personally by me on the following activities: development of treatment plan  with patient and/or surrogate as well as nursing, discussions with consultants, evaluation of patient's response to treatment, examination of patient, obtaining history from patient or surrogate, ordering and performing treatments and interventions, ordering and review of laboratory studies, ordering and review of radiographic studies, pulse oximetry and re-evaluation of patient's condition.  Nanda Quinton, MD Emergency Medicine  ____________________________________________   INITIAL IMPRESSION / ASSESSMENT AND PLAN / ED COURSE  Pertinent labs & imaging results that were available during my care of the patient were reviewed by me and considered in my medical decision making (see chart for details).   This patient is Presenting for Evaluation of SOB, which does require a range of treatment options, and is a complaint that involves a high risk of morbidity and mortality.  The Differential Diagnoses include ACS, PE, CHF, pulmonary edema, CAP, COVID/Flu.  Critical Interventions-    Medications  enoxaparin (LOVENOX) injection 40 mg (40 mg Subcutaneous Given 05/29/21 1925)  acetaminophen (TYLENOL) tablet 650 mg (has no administration in time range)    Or  acetaminophen (TYLENOL) suppository 650 mg (has no administration in time range)  senna-docusate (  Senokot-S) tablet 1 tablet (has no administration in time range)  ondansetron (ZOFRAN) tablet 4 mg (has no administration in time range)    Or  ondansetron (ZOFRAN) injection 4 mg (has no administration in time range)  furosemide (LASIX) injection 60 mg (60 mg Intravenous Given 05/29/21 2046)  losartan (COZAAR) tablet 100 mg (has no administration in time range)  hydrALAZINE (APRESOLINE) tablet 25 mg (has no administration in time range)  furosemide (LASIX) injection 60 mg (60 mg Intravenous Given 05/29/21 1750)    Reassessment after intervention: Good diuresis to start in the ED.    I did obtain Additional Historical Information from EMS and  wife at bedside.  I decided to review pertinent External Data, and in summary no ECHO or heart cath in our system.  Tele-health visits noted in the [facility] system.    Clinical Laboratory Tests Ordered, included D dimer is slightly elevated. Unfortunately, patient is beyond the weight limit for any CT scanner here at [facility].   Kidney functions within normal limits.  No hypokalemia.  Troponin is normal.  COVID and flu testing negative.  Radiologic Tests Ordered, included CXR. I independently interpreted the images and agree with radiology interpretation.   Cardiac Monitor Tracing which shows Sinus tachycardia.   Social Determinants of Health Risk patient is a former smoker.   Consult complete with Hospitalist  Discussed patient's case with [facility] to request admission. Patient and family (if present) updated with plan. Care transferred to [facility] service.   Medical Decision Making: Summary:  Patient presents to the emergency department with new oxygen requirement.  He has pitting edema in the legs up to the abdomen.  Pulmonary edema pattern on chest x-ray.  D-dimer slightly elevated.  Called by radiology tech saying that patient exceeds the weight limit for any CT scanner in this hospital or at [facility].  Overall, suspicion for PE is lower although patient does have multiple risk factors for this.  Discussed with the hospitalist.  Plan for aggressive diuresis and reassess need for CT.    Reevaluation with update and discussion with patient and wife. Updated on labs and plan for admit.   Disposition: admit  ____________________________________________  FINAL CLINICAL IMPRESSION(S) / ED DIAGNOSES  Final diagnoses:  Acute respiratory failure with hypoxia ([place])    Note:  This document was prepared using Dragon voice recognition software and may include unintentional dictation errors.  Nanda Quinton, MD, [facility] Emergency Medicine    Dnaiel Voller, [facility], MD 05/30/21 [phone]

## 2021-05-29 NOTE — H&P (Signed)
History and Physical    Patient: Willie Steele ENI:[id number] DOB: December 29, 1980 DOA: 05/29/2021 DOS: the patient was seen and examined on 05/29/2021 PCP: Tracey Harries, MD  Patient coming from: Home, from PCP office  Chief Complaint:  Chief Complaint  Patient presents with   Shortness of Breath    HPI: 41 year old male with super morbid obesity BMI 84, OSA on cpap-compliant,history of hypertension on losartan/lasix and no other known medical disease presented to the ED with shortness of breath and hypoxia.  Patient has been having dyspnea on exertion, worsening swelling in legs up to abdomen for more than a week along with significant weight gain. He reports he got sick on 2/5 and since then he feels he is getting more fluid in his leg, lungs and weight is worsening.He was seen by PCP while saturating 80% on room air.  He was seen a week ago for similar issue with shortness of breath.  More symptomatic with exertion and on laying flat.  He takes Lasix 20 mg daily.  Denies chest pain on arrival. He symptoms are worse in night too and some chest pain/discomfort.  In the ED,initially hypertensive up to 205/113, saturation improved to 91 to 100% on 3 L nasal cannula.IRW:[id number],P/J interstitial densities.D-dimer 0.9, is above weight limit for CT scan.  EKG personally reviewed showed sinus tachycardia, previous to similar EKG in 2005.  ABG pending, troponin negative x1, Lasix 60 mg ordered.He is former smoker- 10 yr x 0.5ppd. On my exam he is not in distress,comfortable, mother at bedside. He says his wt around 675 lb around Jan 15, and had intentional lost 75 lb last year, and is on discussion for Bariatric surgery at [facility].  Patient denies any focal weakness nausea vomiting abdominal pain, chest pain, fever, chills.  Review of Systems: As mentioned in the history of present illness. All other systems reviewed and are negative. Past Medical History:  Diagnosis Date   Hypertension    History  reviewed. No pertinent surgical history. Social History:  reports that he quit smoking about 10 years ago. His smoking use included cigarettes. He has never used smokeless tobacco. He reports current alcohol use. He reports that he does not use drugs.  Allergies  Allergen Reactions   Sulfa Antibiotics     Family History  Problem Relation Age of Onset   Bowel Disease Father        colon blockage    Prior to Admission medications   Medication Sig Start Date End Date Taking? Authorizing Provider  cholecalciferol (VITAMIN D) 1000 UNITS tablet Take 1,000 Units by mouth daily.    [provider]  furosemide (LASIX) 20 MG tablet Take 20 mg by mouth 2 (two) times daily.    [provider]  losartan (COZAAR) 100 MG tablet Take 100 mg by mouth daily.    [provider]  oseltamivir (TAMIFLU) 75 MG capsule Starting today, take 1 capsule by mouth twice a day for 5 days. 04/12/13   Lajean Manes, MD  promethazine-codeine [facility] WITH CODEINE) 6.25-10 MG/5ML syrup Take 1-2 teaspoons every 4-6 hours as needed for cough. May cause drowsiness. 04/12/13   Lajean Manes, MD    Physical Exam: Vitals:   05/29/21 1458 05/29/21 1530 05/29/21 1600 05/29/21 1615  BP: (!) 119/112 (!) 204/113 (!) 148/98 (!) 163/95  Pulse: (!) 106 (!) 108 (!) 102 100  Resp: (!) 21 (!) 22 19 20   Temp: 98.6 F (37 C)     TempSrc: Oral     SpO2:  100% 100% 96% 99%  Weight:      Height:       General exam: AA0X3,Morbidly obese, pleasant HEENT:Oral mucosa moist, Ear/Nose WNL grossly, dentition normal. Respiratory system: bilaterally diminished BS, no use of accessory muscle Cardiovascular system: S1 & S2 +, No JVD,. Gastrointestinal system: Abdomen soft, significant abdominal obesity with pitting edema  Nervous System:Alert, awake, moving extremities and grossly nonfocal Extremities: Bilateral lower extremity with chronic lymphedema, edema Skin: No rashes,no icterus. MSK: Normal muscle  bulk,tone, power   Data Reviewed: EKG independently reviewed, chest x-ray reviewed and shows cardiomegaly with interstitial edema Labs reviewed Recent Labs  Lab 05/29/21 1526  HGB 13.9  HCT 46.3  WBC 8.2  PLT 239    Recent Labs  Lab 05/29/21 1526  NA 139  K 4.2  CL 101  CO2 35*  GLUCOSE 109*  BUN 18  CREATININE 0.68  CALCIUM 8.6*    Assessment and Plan:  Acute respiratory failure with hypoxia Dyspnea on exertion/Orthopnea Fluid overload and weight gain w/ worsening leg edema: Patient presentation and exam consistent with fluid overload, in the setting of morbid obesity, sleep apnea-rule out right heart dysfunction, given his recent flulike illness rule out cardiomyopathy.  Doing well on 3 L .Bicarb elevated indicating component of hypercapnia VS LASIX  use. we will continue aggressive IV Lasix.  D-dimer marginally elevated but he does not fit in CT scan, do not highly suspect a PE-discussed with the patient is in agreement not to start anticoagulation pending duplex of the lower extremity-I consulted and discussed with Dr. Tonia Brooms from pulmonary  and agrees with this plan, but will await further recs.BNP is normal but due to obesity not sensitive. Follow-up ABG, TTE monitor intake output Daily weight.  If weight improves can consider CT chest versus perfusion scan.  Check hemoglobin A1c TSH and lipid panel  Hypertension, benign: Blood pressure uncontrolled resume home losartan, continue diuresis and adjust  Super Morbid obesity BMI 84-He says his wt around 675 lb around Jan 15, and had intentional lost 7b lb last year, and is on discussion for Bariatric surgery at [facility].  I will obtain hemoglobin A1c TSH lipid panel  OSA on CPAP resume home CPAP  Cardiomegaly-obtaining echocardiogram  Advance Care Planning:   Code Status: Full Code  DVT prophylaxis: enoxaparin (LOVENOX) injection 40 mg Start: 05/29/21 1730 Consults: PCCM Family Communication: Mother at the  bedside  Severity of Illness: The appropriate patient status for this patient is INPATIENT. Inpatient status is judged to be reasonable and necessary in order to provide the required intensity of service to ensure the patient's safety. The patient's presenting symptoms, physical exam findings, and initial radiographic and laboratory data in the context of their chronic comorbidities is felt to place them at high risk for further clinical deterioration. Furthermore, it is not anticipated that the patient will be medically stable for discharge from the hospital within 2 midnights of admission.   * I certify that at the point of admission it is my clinical judgment that the patient will require inpatient hospital care spanning beyond 2 midnights from the point of admission due to high intensity of service, high risk for further deterioration and high frequency of surveillance required.*  Author: Lanae Boast, MD 05/29/2021 5:26 PM  For on call review [email or web address].

## 2021-05-29 NOTE — Consult Note (Addendum)
NAME:  Willie Steele, MRN:  [id number], DOB:  15-Jan-1981, LOS: 0 ADMISSION DATE:  05/29/2021 CONSULTATION DATE:  05/29/2021 REFERRING MD:  Willie Steele - TRH CHIEF COMPLAINT:  SOB, hypoxia  History of Present Illness:  41 year old man who presented to [facility] ED via EMS from his PCP office for hypoxia and worsening SOB. PMHx significant for HTN, OSA (on CPAP), super morbid obesity (BMI 84).   Patient reports that he had been doing well overall, working on weight loss and investigating bariatric surgery when over Willie last couple of weeks he began to feel more SOB with increasing edema. Seen 1 week PTA for SOB and at that time was COVID/Flu negative. Over Willie last week, patient experienced worsening SOB and orthopnea. Occasional shooting chest pain that is central and short-lived. He noticed that he had significantly increased fluid retention (despite his usual home Lasix) when he found it difficult to get into his car or to ambulate from his apartment to Willie elevator in his building.  Patient presented to his PCP today and was found to be hypoxic with O2 sats 79% on RA. Physical exam demonstrated significant LE edema and SOB with poor air movement. He was subsequently sent to Willie Steele ED for further evaluation. On ED arrival, patient was afebrile, mildly tachycardic to 110, BP 119/112 and O2 sat 97% on 3L [place]. Labs demonstrated normal CBC, normal electrolytes/renal function (Cr 0.68), normal AST/mildly elevated ALT (55), troponin 10, BNP 23. Resp panel negative for COVID/flu. D-dimer 0.9, but low suspicion for PE as etiology of SOB. Unable to obtain CT Chest due to body habitus.  PCCM consulted for assistance with management of SOB and hypoxia.  Pertinent Medical History:  HTN, OSA (on CPAP), super morbid obesity (BMI 84)  Significant Hospital Events: Including procedures, antibiotic start and stop dates in addition to other pertinent events   2/15 - Presented to Willie Steele ED via EMS for hypoxia, SOB.   Interim History /  Subjective:  PCCM consulted for assistance with management of SOB, dyspnea, hypoxia  Objective:  Blood pressure (!) 163/95, pulse 100, temperature 98.6 F (37 C), temperature source Oral, resp. rate 20, height 6\' 3"  (1.905 m), weight (!) 308.4 kg, SpO2 99 %.       No intake or output data in Willie 24 hours ending 05/29/21 1738 Filed Weights   05/29/21 1455  Weight: (!) 308.4 kg   Physical Examination: General: Acute-on-chronically ill-appearing man in NAD. HEENT: /AT, anicteric sclera, PERRL, moist mucous membranes. Neuro: Awake, oriented x 4. Responds to verbal stimuli. Following commands consistently. Moves all 4 extremities spontaneously.  CV: Mildly tachycardic, regular rhythm, no m/g/r. PULM: Breathing even and minimally labored on 3LNC. Lung fields distant due to body habitus. CTAB anteriorly. Diminished at bilateral bases. GI: Morbidly obese. Soft, nontender, nondistended. Normoactive bowel sounds. Extremities: Bilateral symmetric 3+ pitting LE edema noted. Willie: Warm/dry, erythematous discoloration of bilateral LE secondary to PVD/chronic volume overload.  Resolved Hospital Problem List:    Assessment & Plan:  Willie Steele is seen in consultation at Willie request of Dr. Jonathon Steele [facility]) for evaluation and management of SOB, dyspnea and hypoxia.  41 year old man who presented to Willie Steele ED via EMS from his PCP office for hypoxia and worsening SOB. PMHx significant for HTN, OSA (on CPAP), super morbid obesity. Suspect Willie vast majority of his symptoms are related to volume overload at this juncture; less likely PE but unable to obtain CTA Chest due to body habitus. Less likely heart failure in Willie setting of  minimally elevated BNP/troponin. Would focus on aggressive diuresis and volume status optimization.  Acute hypoxemic respiratory insufficiency Gross volume overload OSA, on CPAP History of tobacco abuse - Continue supplemental O2 support - CPAP QHS  - Wean O2 for sat > 90% -  Aggressive diuresis - Monitor strict I&Os - Pulmonary hygiene - Obtain LE dopplers, r/o DVT to further diminish PE suspicion - F/u Echo - Trend troponin  Hypertension Home regimen includes losartan 100mg  daily, Lasix 20mg  BID. - Resume home medications as appropriate - Aggressive diuresis as above  Super morbid obesity Working with bariatric surgery to qualify for weight loss surgery ([facility]) - Continue to prioritize weight loss  Best Practice: (right click and "Reselect all SmartList Selections" daily)   Per Primary Team  Labs:  CBC: Recent Labs  Lab 05/29/21 1526  WBC 8.2  NEUTROABS 6.3  HGB 13.9  HCT 46.3  MCV 97.3  PLT 239   Basic Metabolic Panel: Recent Labs  Lab 05/29/21 1526  NA 139  K 4.2  CL 101  CO2 35*  GLUCOSE 109*  BUN 18  CREATININE 0.68  CALCIUM 8.6*   GFR: Estimated Creatinine Clearance: 302.3 mL/min (by C-G formula based on SCr of 0.68 mg/dL). Recent Labs  Lab 05/29/21 1526  WBC 8.2   Liver Function Tests: Recent Labs  Lab 05/29/21 1526  AST 35  ALT 55*  ALKPHOS 69  BILITOT 0.5  PROT 6.8  ALBUMIN 3.2*   No results for input(s): LIPASE, AMYLASE in Willie last 168 hours. No results for input(s): AMMONIA in Willie last 168 hours.  ABG: No results found for: PHART, PCO2ART, PO2ART, HCO3, TCO2, ACIDBASEDEF, O2SAT   Coagulation Profile: No results for input(s): INR, PROTIME in Willie last 168 hours.  Cardiac Enzymes: No results for input(s): CKTOTAL, CKMB, CKMBINDEX, TROPONINI in Willie last 168 hours.  HbA1C: No results found for: HGBA1C  CBG: No results for input(s): GLUCAP in Willie last 168 hours.  Review of Systems:   Review of systems completed with pertinent positives/negatives outlined in above HPI.  Past Medical History:  He,  has a past medical history of Hypertension.   Surgical History:  History reviewed. No pertinent surgical history.   Social History:   reports that he quit smoking about 10 years ago. His  smoking use included cigarettes. He has never used smokeless tobacco. He reports current alcohol use. He reports that he does not use drugs.   Family History:  His family history includes Bowel Disease in his father.   Allergies: Allergies  Allergen Reactions   Sulfa Antibiotics     Home Medications: Prior to Admission medications   Medication Sig Start Date End Date Taking? Authorizing Provider  cholecalciferol (VITAMIN D) 1000 UNITS tablet Take 1,000 Units by mouth daily.    [provider]  furosemide (LASIX) 20 MG tablet Take 20 mg by mouth 2 (two) times daily.    [provider]  losartan (COZAAR) 100 MG tablet Take 100 mg by mouth daily.    [provider]  promethazine-codeine (PHENERGAN WITH CODEINE) 6.25-10 MG/5ML syrup Take 1-2 teaspoons every 4-6 hours as needed for cough. May cause drowsiness. 04/12/13   10-21-1975, MD    Critical care time: N/A   04/14/13 [facility] 05/29/21 5:38 PM  Please see [email or web address] for pager details.  From 7A-7P if no response, please call [phone] After hours, please call 05/31/21 [phone]   PCCM Attending:   41 yo M, >  600lbs, super morbid obesity, BMI 84, OSA on CPAP, HTN.   BP (!) 149/95    Pulse (!) 116    Temp 98.6 F (37 C) (Oral)    Resp (!) 22    Ht 6\' 3"  (1.905 m)    Wt (!) 308.4 kg    SpO2 98%    BMI 84.99 kg/m   Gen: obese male, comfortable, talking  HENT: Large neck  Heart: RRR, s1 s2 , distant heart tones  Lungs: CTAB, diminished  Abd: Soft, obese, distended Ext: BL chronic changes, chronic edema   Labs: Reviewed  Assessment: Super morbidly obese Volume overloaded Acute hypoxemic respiratory failure Likely has acute on chronic diastolic heart failure Minimally elevated D-dimer, likely related to body habitus Family history of DVT, father  Plan: Dopplers of lower extremity Low suspicion for PE, however doppler pending  Would hold off on  anticoagulation Unable to obtain CTA due to body habitus CPAP nightly IV diuresis with Lasix If he does not get good enough output with twice daily IV Lasix would consider Lasix infusion. Also would consider condom cath to help with urine collection due to body habitus. Agree with echo PCCM will sign off. Please call with any questions or concerns.    , DO [facility] 05/29/2021 6:40 PM

## 2021-05-30 ENCOUNTER — Inpatient Hospital Stay (HOSPITAL_COMMUNITY): Payer: Managed Care, Other (non HMO)

## 2021-05-30 ENCOUNTER — Other Ambulatory Visit: Payer: Self-pay

## 2021-05-30 DIAGNOSIS — I5031 Acute diastolic (congestive) heart failure: Secondary | ICD-10-CM

## 2021-05-30 DIAGNOSIS — R609 Edema, unspecified: Secondary | ICD-10-CM

## 2021-05-30 DIAGNOSIS — R0602 Shortness of breath: Secondary | ICD-10-CM

## 2021-05-30 LAB — ECHOCARDIOGRAM COMPLETE
Area-P 1/2: 4.21 cm2
Height: 75 in
S' Lateral: 4.1 cm
Single Plane A4C EF: 39.2 %
Weight: 10880 oz

## 2021-05-30 LAB — COMPREHENSIVE METABOLIC PANEL
ALT: 47 U/L — ABNORMAL HIGH (ref 0–44)
AST: 26 U/L (ref 15–41)
Albumin: 3 g/dL — ABNORMAL LOW (ref 3.5–5.0)
Alkaline Phosphatase: 63 U/L (ref 38–126)
Anion gap: 6 (ref 5–15)
BUN: 17 mg/dL (ref 6–20)
CO2: 34 mmol/L — ABNORMAL HIGH (ref 22–32)
Calcium: 8.5 mg/dL — ABNORMAL LOW (ref 8.9–10.3)
Chloride: 101 mmol/L (ref 98–111)
Creatinine, Ser: 0.76 mg/dL (ref 0.61–1.24)
GFR, Estimated: >60 mL/min (ref >60)
Glucose, Bld: 112 mg/dL — ABNORMAL HIGH (ref 70–99)
Potassium: 4.1 mmol/L (ref 3.5–5.1)
Sodium: 141 mmol/L (ref 135–145)
Total Bilirubin: 0.3 mg/dL (ref 0.3–1.2)
Total Protein: 6.3 g/dL — ABNORMAL LOW (ref 6.5–8.1)

## 2021-05-30 LAB — CBC
HCT: 44.8 % (ref 39.0–52.0)
Hemoglobin: 13.1 g/dL (ref 13.0–17.0)
MCH: 29.2 pg (ref 26.0–34.0)
MCHC: 29.2 g/dL — ABNORMAL LOW (ref 30.0–36.0)
MCV: 99.8 fL (ref 80.0–100.0)
Platelets: 235 10*3/uL (ref 150–400)
RBC: 4.49 MIL/uL (ref 4.22–5.81)
RDW: 16 % — ABNORMAL HIGH (ref 11.5–15.5)
WBC: 7.5 10*3/uL (ref 4.0–10.5)
nRBC: 0 % (ref 0.0–0.2)

## 2021-05-30 LAB — LIPID PANEL
Cholesterol: 126 mg/dL (ref 0–200)
HDL: 25 mg/dL — ABNORMAL LOW (ref >40)
LDL Cholesterol: 84 mg/dL (ref 0–99)
Total CHOL/HDL Ratio: 5 RATIO
Triglycerides: 85 mg/dL (ref <150)
VLDL: 17 mg/dL (ref 0–40)

## 2021-05-30 LAB — BRAIN NATRIURETIC PEPTIDE: B Natriuretic Peptide: 42.9 pg/mL (ref 0.0–100.0)

## 2021-05-30 NOTE — Progress Notes (Signed)
° °  Echocardiogram 2D Echocardiogram has been performed.  Willie Steele 05/30/2021, 11:40 AM

## 2021-05-30 NOTE — ED Notes (Signed)
Portable equipment called to replace bariatric mattress

## 2021-05-30 NOTE — Progress Notes (Signed)
BLE venous duplex has been completed.  Study limited due to habitus.  Preliminary results given to Ko Olina, RN.   Results can be found under chart review under CV PROC. 05/30/2021 9:27 AM Sharine Cadle RVT, RDMS

## 2021-05-30 NOTE — ED Notes (Signed)
Patient transported to floor at this time.

## 2021-05-30 NOTE — Progress Notes (Signed)
PROGRESS NOTE Willie Steele  ZOX:[id number] DOB: 10-29-1980 DOA: 05/29/2021 PCP: Tracey Harries, MD   Brief Narrative/Hospital Course: 41 year old male with super morbid obesity BMI 84, OSA on cpap-compliant,history of hypertension on losartan/lasix and no other known medical disease presented to the ED with shortness of breath and hypoxia.  Patient has been having dyspnea on exertion, worsening swelling in legs up to abdomen for more than a week along with significant weight gain. He reports he got sick on 2/5 and since then he feels he is getting more fluid in his leg, lungs and weight is worsening.He was seen by PCP while saturating 80% on room air.  He was seen a week ago for similar issue with shortness of breath.  More symptomatic with exertion and on laying flat.  He takes Lasix 20 mg daily.  Denies chest pain on arrival. He symptoms are worse in night too and some chest pain/discomfort.  In the ED,initially hypertensive up to 205/113, saturation improved to 91 to 100% on 3 L nasal cannula.WJX:BJYNWGNFAOZH,Y/Q interstitial densities.D-dimer 0.9, is above weight limit for CT scan.  EKG personally reviewed showed sinus tachycardia, previous to similar EKG in 2005.  ABG pending, troponin negative x1, Lasix 60 mg ordered.He is former smoker- 10 yr x 0.5ppd. On my exam he is not in distress,comfortable, mother at bedside. He says his wt around 675 lb around Jan 15, and had intentional lost 75 lb last year, and is on discussion for Bariatric surgery at [facility].    Subjective: Seen and examined this morning.  Patient reports he feels better today had a good sleep with CPAP.  He feels his legs are less tight and less swollen today  Overnight blood pressure fairly stable heart rate respiration rate stable no fever Lab with a stable renal function Output inaccurate as patient had incontinent episode with leak of purewick-urine output charted 4600  Assessment and Plan:  Acute respiratory failure with  hypoxia Dyspnea on exertion/Orthopnea Fluid overload and weight gain w/ worsening leg edema: Presentation consistent with acute hypoxic respiratory failure in the setting of fluid overload with super morbid obesity, obtaining echocardiogram, duplex to rule out DVT likely has acute on chronic diastolic heart failure.  PCCM input appreciated.  Continue on aggressive IV Lasix 60 every 12 hours UOP >4600 as I/o not accurate since patient also had leakage.  Monitor daily weight, intake output, continue low-salt diet.  Asked nursing to measure weight again today.Net IO Since Admission: -4,600 mL [05/30/21 1042]  Filed Weights   05/29/21 1455  Weight: (!) 308.4 kg     Hypertension, benign: Blood pressure uncontrolled on admission.  Blood pressure now improving continue home losartan and diuresis.    Borderline elevated D-dimer follow-up duplex negative for DVT.Patient does not fit in CT scan but low suspicion for PE.   Super Morbid obesity BMI 84-He says his wt around 675 lb around Jan 15, and had intentional lost 7b lb last year, and is on discussion for Bariatric surgery at [facility].  A1c , tsh pending lipid panel stable.   OSA on CPAP  cont CPAP qhs   Cardiomegaly- pending echocardiogram  DVT prophylaxis: enoxaparin (LOVENOX) injection 40 mg Start: 05/29/21 2000 Code Status:   Code Status: Full Code Family Communication: plan of care discussed with patient at bedside.  Disposition: Currently not medically stable for discharge. Status is: Inpatient Remains inpatient appropriate because: Ongoing management of hypoxia fluid overload  Objective: Vitals last 24 hrs: Vitals:   05/30/21 0600 05/30/21 0700 05/30/21 0800  05/30/21 0900  BP: (!) 158/74 (!) 149/71 (!) 157/86 (!) 141/80  Pulse: 68 65 73 86  Resp: 20 19 18  (!) 21  Temp:      TempSrc:      SpO2: 95% 95% 97% 96%  Weight:      Height:       Weight change:   Physical Examination: General exam: Aa0x3, morbidly obese, pleasant, older  than stated age, weak appearing. HEENT:Oral mucosa moist, Ear/Nose WNL grossly, dentition normal. Respiratory system: bilaterally diminished BS, no use of accessory muscle Cardiovascular system: S1 & S2 +, No JVD,. Gastrointestinal system: Abdomen soft,NT,ND, BS+ Nervous System:Alert, awake, moving extremities and grossly nonfocal Extremities: Chronic lymphedematous lower extremity with edema up to abdomen  Skin: No rashes,no icterus. MSK: Normal muscle bulk,tone, power  Medications reviewed: Scheduled Meds:  enoxaparin (LOVENOX) injection  40 mg Subcutaneous Q12H   furosemide  60 mg Intravenous BID   losartan  100 mg Oral Daily   Continuous Infusions:    Diet Order             Diet 2 gram sodium Room service appropriate? Yes; Fluid consistency: Thin; Fluid restriction: 1200 mL Fluid  Diet effective now                    Intake/Output Summary (Last 24 hours) at 05/30/2021 1042 Last data filed at 05/30/2021 0246 Gross per 24 hour  Intake --  Output 4600 ml  Net -4600 ml   Net IO Since Admission: -4,600 mL [05/30/21 1042]  Wt Readings from Last 3 Encounters:  05/29/21 (!) 308.4 kg  04/12/13 (!) 222.3 kg  12/21/12 (!) 233.6 kg     Unresulted Labs (From admission, onward)     Start     Ordered   06/05/21 0500  Creatinine, serum  (enoxaparin (LOVENOX)    CrCl >/= 30 ml/min)  Weekly,   R     Comments: while on enoxaparin therapy    05/29/21 1725   05/31/21 0500  Basic metabolic panel  Daily,   R     Question:  Specimen collection method  Answer:  Lab=Lab collect   05/30/21 0728   05/31/21 0500  Magnesium  Tomorrow morning,   R       Question:  Specimen collection method  Answer:  Lab=Lab collect   05/30/21 0728   05/29/21 1811  TSH  Once,   STAT        05/29/21 1811   05/29/21 1725  Hemoglobin A1c  Add-on,   AD        05/29/21 1725          Data Reviewed: I have personally reviewed following labs and imaging studies CBC: Recent Labs  Lab 05/29/21 1526  05/30/21 0326  WBC 8.2 7.5  NEUTROABS 6.3  --   HGB 13.9 13.1  HCT 46.3 44.8  MCV 97.3 99.8  PLT 239 235   Basic Metabolic Panel: Recent Labs  Lab 05/29/21 1526 05/30/21 0326  NA 139 141  K 4.2 4.1  CL 101 101  CO2 35* 34*  GLUCOSE 109* 112*  BUN 18 17  CREATININE 0.68 0.76  CALCIUM 8.6* 8.5*   GFR: Estimated Creatinine Clearance: 302.3 mL/min (by C-G formula based on SCr of 0.76 mg/dL). Liver Function Tests: Recent Labs  Lab 05/29/21 1526 05/30/21 0326  AST 35 26  ALT 55* 47*  ALKPHOS 69 63  BILITOT 0.5 0.3  PROT 6.8 6.3*  ALBUMIN 3.2* 3.0*  No results for input(s): LIPASE, AMYLASE in the last 168 hours. No results for input(s): AMMONIA in the last 168 hours. Coagulation Profile: No results for input(s): INR, PROTIME in the last 168 hours. Cardiac Enzymes: No results for input(s): CKTOTAL, CKMB, CKMBINDEX, TROPONINI in the last 168 hours. BNP (last 3 results) No results for input(s): PROBNP in the last 8760 hours. HbA1C: No results for input(s): HGBA1C in the last 72 hours. CBG: No results for input(s): GLUCAP in the last 168 hours. Lipid Profile: Recent Labs    05/30/21 0326  CHOL 126  HDL 25*  LDLCALC 84  TRIG 85  CHOLHDL 5.0   Thyroid Function Tests: No results for input(s): TSH, T4TOTAL, FREET4, T3FREE, THYROIDAB in the last 72 hours. Anemia Panel: No results for input(s): VITAMINB12, FOLATE, FERRITIN, TIBC, IRON, RETICCTPCT in the last 72 hours. Sepsis Labs: No results for input(s): PROCALCITON, LATICACIDVEN in the last 168 hours.  Recent Results (from the past 240 hour(s))  Resp Panel by RT-PCR (Flu A&B, Covid) Nasopharyngeal Swab     Status: None   Collection Time: 05/29/21  3:26 PM   Specimen: Nasopharyngeal Swab; Nasopharyngeal(NP) swabs in vial transport medium  Result Value Ref Range Status   SARS Coronavirus 2 by RT PCR NEGATIVE NEGATIVE Final    Comment: (NOTE) SARS-CoV-2 target nucleic acids are NOT DETECTED.  The SARS-CoV-2  RNA is generally detectable in upper respiratory specimens during the acute phase of infection. The lowest concentration of SARS-CoV-2 viral copies this assay can detect is 138 copies/mL. A negative result does not preclude SARS-Cov-2 infection and should not be used as the sole basis for treatment or other patient management decisions. A negative result may occur with  improper specimen collection/handling, submission of specimen other than nasopharyngeal swab, presence of viral mutation(s) within the areas targeted by this assay, and inadequate number of viral copies(<138 copies/mL). A negative result must be combined with clinical observations, patient history, and epidemiological information. The expected result is Negative.  Fact Sheet for Patients:  BloggerCourse.com  Fact Sheet for Healthcare Providers:  SeriousBroker.it  This test is no t yet approved or cleared by the [place] FDA and  has been authorized for detection and/or diagnosis of SARS-CoV-2 by FDA under an Emergency Use Authorization (EUA). This EUA will remain  in effect (meaning this test can be used) for the duration of the COVID-19 declaration under Section 564(b)(1) of the Act, 21 U.S.C.section 360bbb-3(b)(1), unless the authorization is terminated  or revoked sooner.       Influenza A by PCR NEGATIVE NEGATIVE Final   Influenza B by PCR NEGATIVE NEGATIVE Final    Comment: (NOTE) The Xpert Xpress SARS-CoV-2/FLU/RSV plus assay is intended as an aid in the diagnosis of influenza from Nasopharyngeal swab specimens and should not be used as a sole basis for treatment. Nasal washings and aspirates are unacceptable for Xpert Xpress SARS-CoV-2/FLU/RSV testing.  Fact Sheet for Patients: BloggerCourse.com  Fact Sheet for Healthcare Providers: SeriousBroker.it  This test is not yet approved or cleared by the  [place] FDA and has been authorized for detection and/or diagnosis of SARS-CoV-2 by FDA under an Emergency Use Authorization (EUA). This EUA will remain in effect (meaning this test can be used) for the duration of the COVID-19 declaration under Section 564(b)(1) of the Act, 21 U.S.C. section 360bbb-3(b)(1), unless the authorization is terminated or revoked.  Performed at [facility], [address], [place], [place] [address]     Antimicrobials: Anti-infectives (From admission,  onward)    None      Culture/Microbiology No results found for: SDES, SPECREQUEST, CULT, REPTSTATUS  Other culture-see note   Radiology Studies: DG Chest 1 View  Result Date: 05/29/2021 CLINICAL DATA:  Shortness of breath.  Lower extremity swelling. EXAM: CHEST  1 VIEW COMPARISON:  01/31/2004 FINDINGS: The cardiac silhouette is mildly enlarged. There is pulmonary vascular congestion with diffusely increased interstitial markings in both lungs. No focal airspace consolidation, large pleural effusion, or pneumothorax is identified. IMPRESSION: Cardiomegaly and bilateral interstitial densities, likely edema Electronically Signed   By: Sebastian Ache M.D.   On: 05/29/2021 15:58   VAS [place] LOWER EXTREMITY VENOUS (DVT) (ONLY [facility] & [facility])  Result Date: 05/30/2021  Lower Venous DVT Study Patient Name:  OLUWAFEMI VILLELLA  Date of Exam:   05/30/2021 Medical Rec #: [id number]      Accession #:    [id number] Date of Birth: Sep 29, 1980      Patient Gender: M Patient Age:   38 years Exam Location:  [facility] Procedure:      VAS [place] LOWER EXTREMITY VENOUS (DVT) Referring Phys: JOSHUA LONG --------------------------------------------------------------------------------  Indications: SOB, and Edema.  Limitations: Body habitus (BMI 85) and poor ultrasound/tissue interface. Comparison Study: No previous exams Performing Technologist: Jody Hill RVT, RDMS  Examination Guidelines: A complete evaluation includes  B-mode imaging, spectral Doppler, color Doppler, and power Doppler as needed of all accessible portions of each vessel. Bilateral testing is considered an integral part of a complete examination. Limited examinations for reoccurring indications may be performed as noted. The reflux portion of the exam is performed with the patient in reverse Trendelenburg.  +---------+---------------+---------+-----------+----------+-------------------+  RIGHT     Compressibility Phasicity Spontaneity Properties Thrombus Aging       +---------+---------------+---------+-----------+----------+-------------------+  CFV       Full            Yes       Yes                                         +---------+---------------+---------+-----------+----------+-------------------+  SFJ       Full                                                                  +---------+---------------+---------+-----------+----------+-------------------+  FV Prox   Full            Yes       Yes                                         +---------+---------------+---------+-----------+----------+-------------------+  FV Mid    Full            Yes       Yes                                         +---------+---------------+---------+-----------+----------+-------------------+  FV Distal                 Yes  Yes                    patent by color and                                                              doppler              +---------+---------------+---------+-----------+----------+-------------------+  PFV       Full                                                                  +---------+---------------+---------+-----------+----------+-------------------+  POP       Full            Yes       Yes                                         +---------+---------------+---------+-----------+----------+-------------------+  PTV                                                        Not visualized        +---------+---------------+---------+-----------+----------+-------------------+  PERO                                                       Not visualized       +---------+---------------+---------+-----------+----------+-------------------+   Right Technical Findings: Not visualized segments include posterior tibial and peroneal veins.  +---------+---------------+---------+-----------+----------+--------------+  LEFT      Compressibility Phasicity Spontaneity Properties Thrombus Aging  +---------+---------------+---------+-----------+----------+--------------+  CFV       Full            Yes       Yes                                    +---------+---------------+---------+-----------+----------+--------------+  SFJ       Full                                                             +---------+---------------+---------+-----------+----------+--------------+  FV Prox   Full            Yes       Yes                                    +---------+---------------+---------+-----------+----------+--------------+  FV Mid    Full            Yes       Yes                                    +---------+---------------+---------+-----------+----------+--------------+  FV Distal                                                  Not visualized  +---------+---------------+---------+-----------+----------+--------------+  PFV       Full                                                             +---------+---------------+---------+-----------+----------+--------------+  POP       Full            Yes       Yes                                    +---------+---------------+---------+-----------+----------+--------------+  PTV                                                        Not visualized  +---------+---------------+---------+-----------+----------+--------------+  PERO                                                       Not visualized  +---------+---------------+---------+-----------+----------+--------------+   Left  Technical Findings: Not visualized segments include distal femoral, posterior tibial, and peroneal veins.   Summary: BILATERAL: -No evidence of popliteal cyst, bilaterally. RIGHT: - There is no evidence of deep vein thrombosis in the lower extremity. However, portions of this examination were limited- see technologist comments above.  LEFT: - There is no evidence of deep vein thrombosis in the lower extremity. However, portions of this examination were limited- see technologist comments above.  *See table(s) above for measurements and observations.    Preliminary      LOS: 1 day   Lanae Boastamesh Tezra Mahr, MD [facility]  05/30/2021, 10:42 AM

## 2021-05-31 LAB — BASIC METABOLIC PANEL
Anion gap: 6 (ref 5–15)
BUN: 16 mg/dL (ref 6–20)
CO2: 38 mmol/L — ABNORMAL HIGH (ref 22–32)
Calcium: 8.6 mg/dL — ABNORMAL LOW (ref 8.9–10.3)
Chloride: 95 mmol/L — ABNORMAL LOW (ref 98–111)
Creatinine, Ser: 0.83 mg/dL (ref 0.61–1.24)
GFR, Estimated: >60 mL/min (ref >60)
Glucose, Bld: 107 mg/dL — ABNORMAL HIGH (ref 70–99)
Potassium: 3.9 mmol/L (ref 3.5–5.1)
Sodium: 139 mmol/L (ref 135–145)

## 2021-05-31 LAB — HEMOGLOBIN A1C
Hgb A1c MFr Bld: 5.7 % — ABNORMAL HIGH (ref 4.8–5.6)
Mean Plasma Glucose: 116.89 mg/dL

## 2021-05-31 LAB — TSH: TSH: 1.205 u[IU]/mL (ref 0.350–4.500)

## 2021-05-31 LAB — MAGNESIUM: Magnesium: 2.1 mg/dL (ref 1.7–2.4)

## 2021-05-31 MED ORDER — ENOXAPARIN SODIUM 60 MG/0.6ML IJ SOSY
60.0000 mg | PREFILLED_SYRINGE | Freq: Two times a day (BID) | INTRAMUSCULAR | Status: DC
  Administered 2021-05-31 – 2021-06-03 (×6): 60 mg via SUBCUTANEOUS
  Filled 2021-05-31 (×6): qty 0.6

## 2021-05-31 NOTE — Progress Notes (Signed)
PROGRESS NOTE Willie Steele  QMV:[id number] DOB: [birth year] DOA: 05/29/2021 PCP: Tracey Harries, MD   Brief Narrative/Hospital Course: 41 year old male with super morbid obesity BMI 84, OSA on cpap-compliant,history of hypertension on losartan/lasix and no other known medical disease presented to the ED with shortness of breath and hypoxia.  Patient has been having dyspnea on exertion, worsening swelling in legs up to abdomen for more than a week along with significant weight gain. He reports he got sick on 2/5 and since then he feels he is getting more fluid in his leg, lungs and weight is worsening.He was seen by PCP while saturating 80% on room air.  He was seen a week ago for similar issue with shortness of breath.  More symptomatic with exertion and on laying flat.  He takes Lasix 20 mg daily.  Denies chest pain on arrival. He symptoms are worse in night too and some chest pain/discomfort.  In the ED,initially hypertensive up to 205/113, saturation improved to 91 to 100% on 3 L nasal cannula.MWU:[id number],D/G interstitial densities.D-dimer 0.9, is above weight limit for CT scan.  EKG personally reviewed showed sinus tachycardia, previous to similar EKG in 2005.  ABG pending, troponin negative x1, Lasix 60 mg ordered.He is former smoker- 10 yr x 0.5ppd. On my exam he is not in distress,comfortable, mother at bedside. He says his wt around 675 lb around Jan 15, and had intentional lost 75 lb last year, and is on discussion for Bariatric surgery at [facility].    Subjective: Slept well Needed 02 bleed through CPAP- but was on baby [place].  Otherwise denies any complaints reports he feels much lighter able to move his extremities feels less fluid in his abdomen wall  Assessment and Plan:  Acute respiratory failure with hypoxia Dyspnea on exertion/Orthopnea Fluid overload and weight gain w/ worsening leg edema: Presentation consistent with acute hypoxic respiratory failure in the setting of fluid  overload with super morbid obesity, echo limited but with normal EF, duplex no DVT.  Suspecting acute diastolic CHF and fluid overload.PCCM input appreciated.  Continue on aggressive IV Lasix 60 every 12 hours having good urine output net -8 L see below.  Continue ambulation today PT OT,Monitor daily weight, intake output, continue low-salt diet weight improving but I doubt initial weight to be accurate.Net IO Since Admission: -8,150 mL [05/31/21 0855]  Filed Weights   05/29/21 1455 05/31/21 0500  Weight: (!) 308.4 kg (!) 307.5 kg     Hypertension, benign: Fairly stable continue diuretics and losartan, allow blood pressure on higher side to allow for diuresis   Borderline elevated D-dimer follow-up duplex negative for DVT.Patient does not fit in CT scan but low suspicion for PE.   Super Morbid obesity BMI 84-He says his wt around 675 lb around Jan 15, and had intentional lost 7b lb last year, and is on discussion for Bariatric surgery at [facility].  A1c , tsh pending lipid panel stable.   OSA on CPAP  cont CPAP qhs Prediabetes with A1c 5.7 definitely weight loss will be beneficial Cardiomegaly- echo limited   DVT prophylaxis: enoxaparin (LOVENOX) injection 40 mg Start: 05/29/21 2000 Code Status:   Code Status: Full Code Family Communication: plan of care discussed with patient at bedside.  Disposition: Currently not medically stable for discharge. Status is: Inpatient Remains inpatient appropriate because: Ongoing management of hypoxia fluid overload  Objective: Vitals last 24 hrs: Vitals:   05/31/21 0438 05/31/21 0500 05/31/21 0524 05/31/21 0812  BP:   (!) 151/88 (!) 170/87  Pulse:   72 73  Resp:   17   Temp:   98.3 F (36.8 C)   TempSrc:      SpO2: 94%  97% 99%  Weight:  (!) 307.5 kg    Height:       Weight change: -0.996 kg  Physical Examination: General exam: AAX3, pleasant, older than stated age, weak appearing. HEENT:Oral mucosa moist, Ear/Nose WNL grossly, dentition  normal. Respiratory system: bilaterally diminished, no use of accessory muscle Cardiovascular system: S1 & S2 +, No JVD,. Gastrointestinal system: Abdomen soft,NT,ND,BS+ Nervous System:Alert, awake, moving extremities and grossly nonfocal Extremities: Extensive lower extremity lymphedema with improving edema, edema in abdominal wall and thigh  Skin: No rashes,no icterus. MSK: Normal muscle bulk,tone, power   Medications reviewed: Scheduled Meds:  enoxaparin (LOVENOX) injection  40 mg Subcutaneous Q12H   furosemide  60 mg Intravenous BID   losartan  100 mg Oral Daily   Continuous Infusions:    Diet Order             Diet 2 gram sodium Room service appropriate? Yes; Fluid consistency: Thin; Fluid restriction: 1200 mL Fluid  Diet effective now                    Intake/Output Summary (Last 24 hours) at 05/31/2021 0855 Last data filed at 05/31/2021 0110 Gross per 24 hour  Intake --  Output 3550 ml  Net -3550 ml    Net IO Since Admission: -8,150 mL [05/31/21 0855]  Wt Readings from Last 3 Encounters:  05/31/21 (!) 307.5 kg  04/12/13 (!) 222.3 kg  12/21/12 (!) 233.6 kg     [employer] (From admission, onward)     Start     Ordered   06/05/21 0500  Creatinine, serum  (enoxaparin (LOVENOX)    CrCl >/= 30 ml/min)  Weekly,   R     Comments: while on enoxaparin therapy    05/29/21 1725   05/31/21 0500  Basic metabolic panel  Daily,   R     Question:  Specimen collection method  Answer:  Lab=Lab collect   05/30/21 0728          Data Reviewed: I have personally reviewed following labs and imaging studies CBC: Recent Labs  Lab 05/29/21 1526 05/30/21 0326  WBC 8.2 7.5  NEUTROABS 6.3  --   HGB 13.9 13.1  HCT 46.3 44.8  MCV 97.3 99.8  PLT 239 235    Basic Metabolic Panel: Recent Labs  Lab 05/29/21 1526 05/30/21 0326 05/31/21 0458  NA 139 141 139  K 4.2 4.1 3.9  CL 101 101 95*  CO2 35* 34* 38*  GLUCOSE 109* 112* 107*  BUN 18 17 16   CREATININE 0.68  0.76 0.83  CALCIUM 8.6* 8.5* 8.6*  MG  --   --  2.1    GFR: Estimated Creatinine Clearance: 290.7 mL/min (by C-G formula based on SCr of 0.83 mg/dL). Liver Function Tests: Recent Labs  Lab 05/29/21 1526 05/30/21 0326  AST 35 26  ALT 55* 47*  ALKPHOS 69 63  BILITOT 0.5 0.3  PROT 6.8 6.3*  ALBUMIN 3.2* 3.0*    No results for input(s): LIPASE, AMYLASE in the last 168 hours. No results for input(s): AMMONIA in the last 168 hours. Coagulation Profile: No results for input(s): INR, PROTIME in the last 168 hours. Cardiac Enzymes: No results for input(s): CKTOTAL, CKMB, CKMBINDEX, TROPONINI in the last 168 hours. BNP (last 3 results) No results for input(s):  PROBNP in the last 8760 hours. HbA1C: Recent Labs    05/31/21 0458  HGBA1C 5.7*   CBG: No results for input(s): GLUCAP in the last 168 hours. Lipid Profile: Recent Labs    05/30/21 0326  CHOL 126  HDL 25*  LDLCALC 84  TRIG 85  CHOLHDL 5.0    Thyroid Function Tests: Recent Labs    05/31/21 0458  TSH 1.205   Anemia Panel: No results for input(s): VITAMINB12, FOLATE, FERRITIN, TIBC, IRON, RETICCTPCT in the last 72 hours. Sepsis Labs: No results for input(s): PROCALCITON, LATICACIDVEN in the last 168 hours.  Recent Results (from the past 240 hour(s))  Resp Panel by RT-PCR (Flu A&B, Covid) Nasopharyngeal Swab     Status: None   Collection Time: 05/29/21  3:26 PM   Specimen: Nasopharyngeal Swab; Nasopharyngeal(NP) swabs in vial transport medium  Result Value Ref Range Status   SARS Coronavirus 2 by RT PCR NEGATIVE NEGATIVE Final    Comment: (NOTE) SARS-CoV-2 target nucleic acids are NOT DETECTED.  The SARS-CoV-2 RNA is generally detectable in upper respiratory specimens during the acute phase of infection. The lowest concentration of SARS-CoV-2 viral copies this assay can detect is 138 copies/mL. A negative result does not preclude SARS-Cov-2 infection and should not be used as the sole basis for treatment  or other patient management decisions. A negative result may occur with  improper specimen collection/handling, submission of specimen other than nasopharyngeal swab, presence of viral mutation(s) within the areas targeted by this assay, and inadequate number of viral copies(<138 copies/mL). A negative result must be combined with clinical observations, patient history, and epidemiological information. The expected result is Negative.  Fact Sheet for Patients:  BloggerCourse.com  Fact Sheet for Healthcare Providers:  SeriousBroker.it  This test is no t yet approved or cleared by the [place] FDA and  has been authorized for detection and/or diagnosis of SARS-CoV-2 by FDA under an Emergency Use Authorization (EUA). This EUA will remain  in effect (meaning this test can be used) for the duration of the COVID-19 declaration under Section 564(b)(1) of the Act, 21 U.S.C.section 360bbb-3(b)(1), unless the authorization is terminated  or revoked sooner.       Influenza A by PCR NEGATIVE NEGATIVE Final   Influenza B by PCR NEGATIVE NEGATIVE Final    Comment: (NOTE) The Xpert Xpress SARS-CoV-2/FLU/RSV plus assay is intended as an aid in the diagnosis of influenza from Nasopharyngeal swab specimens and should not be used as a sole basis for treatment. Nasal washings and aspirates are unacceptable for Xpert Xpress SARS-CoV-2/FLU/RSV testing.  Fact Sheet for Patients: BloggerCourse.com  Fact Sheet for Healthcare Providers: SeriousBroker.it  This test is not yet approved or cleared by the [place] FDA and has been authorized for detection and/or diagnosis of SARS-CoV-2 by FDA under an Emergency Use Authorization (EUA). This EUA will remain in effect (meaning this test can be used) for the duration of the COVID-19 declaration under Section 564(b)(1) of the Act, 21 U.S.C. section  360bbb-3(b)(1), unless the authorization is terminated or revoked.  Performed at [facility] [address], [place], [place] [address]      Antimicrobials: Anti-infectives (From admission, onward)    None      Culture/Microbiology No results found for: SDES, SPECREQUEST, CULT, REPTSTATUS  Other culture-see note   Radiology Studies: DG Chest 1 View  Result Date: 05/29/2021 CLINICAL DATA:  Shortness of breath.  Lower extremity swelling. EXAM: CHEST  1 VIEW COMPARISON:  01/31/2004 FINDINGS: The  cardiac silhouette is mildly enlarged. There is pulmonary vascular congestion with diffusely increased interstitial markings in both lungs. No focal airspace consolidation, large pleural effusion, or pneumothorax is identified. IMPRESSION: Cardiomegaly and bilateral interstitial densities, likely edema Electronically Signed   By: Sebastian AcheAllen  Grady M.D.   On: 05/29/2021 15:58   ECHOCARDIOGRAM COMPLETE  Result Date: 05/30/2021    ECHOCARDIOGRAM REPORT   Patient Name:   Willie MauJOSH Steele Date of Exam: 05/30/2021 Medical Rec #:  [id number]     Height:       75.0 in Accession #:    [id number]    Weight:       680.0 lb Date of Birth:  08/29/1980     BSA:          3.691 m Patient Age:    41 years      BP:           149/71 mmHg Patient Gender: M             HR:           76 bpm. Exam Location:  Inpatient Procedure: 2D Echo, Limited Echo, Cardiac Doppler and Color Doppler Indications:    I50.31 CHF  History:        Patient has no prior history of Echocardiogram examinations.                 Risk Factors:Hypertension.  Sonographer:    Festus BarrenJeanine Russo Referring Phys: [id number] Karlyn Glasco  Sonographer Comments: Suboptimal parasternal window, suboptimal apical window and patient is morbidly obese. Image acquisition challenging due to patient body habitus. PATIENT STATES WEIGHT APPROXIMATELY 690 LBS / DUE TO BODY HABITUS SUBOPTIMAL STUDY. IMPRESSIONS  1. Nondiagnostic study due to very poor echo windows and  visualization of cardiac structures.  2. Unable to visualize LV endocardium, however, based on very limited views, LVEF appears to be within normal range 55-65%. Left ventricular endocardial border not optimally defined to evaluate regional wall motion.  3. Right ventricular systolic function is normal. The right ventricular size is not well visualized.  4. The mitral valve was not well visualized.  5. The aortic valve was not well visualized.  6. The inferior vena cava is dilated in size with <50% respiratory variability, suggesting right atrial pressure of 15 mmHg. FINDINGS  Left Ventricle: Left ventricular endocardial border not optimally defined to evaluate regional wall motion. Suboptimal image quality limits for assessment of left ventricular hypertrophy. Right Ventricle: The right ventricular size is not well visualized. Right vetricular wall thickness was not well visualized. Right ventricular systolic function is normal. Left Atrium: Left atrial size was not well visualized. Right Atrium: Right atrial size was not well visualized. Pericardium: The pericardium was not well visualized. Mitral Valve: The mitral valve was not well visualized. Tricuspid Valve: The tricuspid valve is not well visualized. Aortic Valve: The aortic valve was not well visualized. Pulmonic Valve: The pulmonic valve was not well visualized. Pulmonic valve regurgitation is trivial. Aorta: The aortic root is normal in size and structure. Venous: The inferior vena cava is dilated in size with less than 50% respiratory variability, suggesting right atrial pressure of 15 mmHg. IAS/Shunts: The interatrial septum was not well visualized.  LEFT VENTRICLE PLAX 2D LVIDd:         6.10 cm      Diastology LVIDs:         4.10 cm      LV e' medial:    10.90 cm/s LV PW:  1.40 cm      LV E/e' medial:  10.7 LV IVS:        1.10 cm      LV e' lateral:   12.10 cm/s LVOT diam:     2.10 cm      LV E/e' lateral: 9.7 LVOT Area:     3.46 cm  LV Volumes  (MOD) LV vol d, MOD A4C: 199.0 ml LV vol s, MOD A4C: 121.0 ml LV SV MOD A4C:     199.0 ml RIGHT VENTRICLE             IVC RV S prime:     13.60 cm/s  IVC diam: 3.50 cm TAPSE (M-mode): 3.0 cm LEFT ATRIUM         Index LA diam:    4.10 cm 1.11 cm/m                        PULMONIC VALVE AORTA                 PV Vmax:       0.99 m/s Ao Root diam: 2.70 cm PV Vmean:      62.400 cm/s Ao Asc diam:  3.50 cm PV VTI:        0.230 m                       PV Peak grad:  3.9 mmHg                       PV Mean grad:  2.0 mmHg  MITRAL VALVE MV Area (PHT): 4.21 cm     SHUNTS MV Decel Time: 180 msec     Systemic Diam: 2.10 cm MV E velocity: 117.00 cm/s MV A velocity: 74.40 cm/s MV E/A ratio:  1.57 Laurance Flatten MD Electronically signed by Laurance Flatten MD Signature Date/Time: 05/30/2021/12:20:03 PM    Final    VAS [place] LOWER EXTREMITY VENOUS (DVT) (ONLY [facility] & [facility])  Result Date: 05/30/2021  Lower Venous DVT Study Patient Name:  Willie Steele  Date of Exam:   05/30/2021 Medical Rec #: [id number]      Accession #:    [id number] Date of Birth: 1981-03-18      Patient Gender: M Patient Age:   65 years Exam Location:  [facility] Procedure:      VAS [place] LOWER EXTREMITY VENOUS (DVT) Referring Phys: JOSHUA LONG --------------------------------------------------------------------------------  Indications: SOB, and Edema.  Limitations: Body habitus (BMI 85) and poor ultrasound/tissue interface. Comparison Study: No previous exams Performing Technologist: Jody Hill RVT, RDMS  Examination Guidelines: A complete evaluation includes B-mode imaging, spectral Doppler, color Doppler, and power Doppler as needed of all accessible portions of each vessel. Bilateral testing is considered an integral part of a complete examination. Limited examinations for reoccurring indications may be performed as noted. The reflux portion of the exam is performed with the patient in reverse Trendelenburg.   +---------+---------------+---------+-----------+----------+-------------------+  RIGHT     Compressibility Phasicity Spontaneity Properties Thrombus Aging       +---------+---------------+---------+-----------+----------+-------------------+  CFV       Full            Yes       Yes                                         +---------+---------------+---------+-----------+----------+-------------------+  SFJ       Full                                                                  +---------+---------------+---------+-----------+----------+-------------------+  FV Prox   Full            Yes       Yes                                         +---------+---------------+---------+-----------+----------+-------------------+  FV Mid    Full            Yes       Yes                                         +---------+---------------+---------+-----------+----------+-------------------+  FV Distal                 Yes       Yes                    patent by color and                                                              doppler              +---------+---------------+---------+-----------+----------+-------------------+  PFV       Full                                                                  +---------+---------------+---------+-----------+----------+-------------------+  POP       Full            Yes       Yes                                         +---------+---------------+---------+-----------+----------+-------------------+  PTV                                                        Not visualized       +---------+---------------+---------+-----------+----------+-------------------+  PERO                                                       Not  visualized       +---------+---------------+---------+-----------+----------+-------------------+   Right Technical Findings: Not visualized segments include posterior tibial and peroneal veins.   +---------+---------------+---------+-----------+----------+--------------+  LEFT      Compressibility Phasicity Spontaneity Properties Thrombus Aging  +---------+---------------+---------+-----------+----------+--------------+  CFV       Full            Yes       Yes                                    +---------+---------------+---------+-----------+----------+--------------+  SFJ       Full                                                             +---------+---------------+---------+-----------+----------+--------------+  FV Prox   Full            Yes       Yes                                    +---------+---------------+---------+-----------+----------+--------------+  FV Mid    Full            Yes       Yes                                    +---------+---------------+---------+-----------+----------+--------------+  FV Distal                                                  Not visualized  +---------+---------------+---------+-----------+----------+--------------+  PFV       Full                                                             +---------+---------------+---------+-----------+----------+--------------+  POP       Full            Yes       Yes                                    +---------+---------------+---------+-----------+----------+--------------+  PTV                                                        Not visualized  +---------+---------------+---------+-----------+----------+--------------+  PERO  Not visualized  +---------+---------------+---------+-----------+----------+--------------+   Left Technical Findings: Not visualized segments include distal femoral, posterior tibial, and peroneal veins.   Summary: BILATERAL: -No evidence of popliteal cyst, bilaterally. RIGHT: - There is no evidence of deep vein thrombosis in the lower extremity. However, portions of this examination were limited- see technologist comments above.  LEFT: -  There is no evidence of deep vein thrombosis in the lower extremity. However, portions of this examination were limited- see technologist comments above.  *See table(s) above for measurements and observations. Electronically signed by Heath Lark on 05/30/2021 at 2:17:32 PM.    Final      LOS: 2 days   Lanae Boast, MD [facility]  05/31/2021, 8:55 AM

## 2021-06-01 LAB — BASIC METABOLIC PANEL
Anion gap: 7 (ref 5–15)
BUN: 16 mg/dL (ref 6–20)
CO2: 40 mmol/L — ABNORMAL HIGH (ref 22–32)
Calcium: 8.8 mg/dL — ABNORMAL LOW (ref 8.9–10.3)
Chloride: 92 mmol/L — ABNORMAL LOW (ref 98–111)
Creatinine, Ser: 0.73 mg/dL (ref 0.61–1.24)
GFR, Estimated: >60 mL/min (ref >60)
Glucose, Bld: 106 mg/dL — ABNORMAL HIGH (ref 70–99)
Potassium: 3.7 mmol/L (ref 3.5–5.1)
Sodium: 139 mmol/L (ref 135–145)

## 2021-06-01 MED ORDER — NYSTATIN 100000 UNIT/GM EX POWD
Freq: Three times a day (TID) | CUTANEOUS | Status: DC
  Administered 2021-06-02: 1 via TOPICAL
  Filled 2021-06-01: qty 15

## 2021-06-01 NOTE — Progress Notes (Signed)
PROGRESS NOTE Willie Steele  [id number] DOB: [birth year] DOA: 05/29/2021 PCP: Bernerd Limbo, MD   Brief Narrative/Hospital Course: 41 year old male with super morbid obesity BMI 84, OSA on cpap-compliant,history of hypertension on losartan/lasix and no other known medical disease presented to the ED with shortness of breath and hypoxia.  Patient has been having dyspnea on exertion, worsening swelling in legs up to abdomen for more than a week along with significant weight gain. He reports he got sick on 2/5 and since then he feels he is getting more fluid in his leg, lungs and weight is worsening.He was seen by PCP while saturating 80% on room air.  He was seen a week ago for similar issue with shortness of breath.  More symptomatic with exertion and on laying flat.  He takes Lasix 20 mg daily.  Denies chest pain on arrival. He symptoms are worse in night too and some chest pain/discomfort.  In the ED,initially hypertensive up to 205/113, saturation improved to 91 to 100% on 3 L nasal cannula.[id number] interstitial densities.D-dimer 0.9, is above weight limit for CT scan.  EKG personally reviewed showed sinus tachycardia, previous to similar EKG in 2005.  ABG pending, troponin negative x1, Lasix 60 mg ordered.He is former smoker- 10 yr x 0.5ppd. On my exam he is not in distress,comfortable, mother at bedside. He says his wt around 675 lb around Jan 15, and had intentional lost 75 lb last year, and is on discussion for Bariatric surgery at [facility]. Continued on iv lasix with good UOP and improving fluid overload.    Subjective: Seen and examined this morning.  Patient reports  "this is unreal how better I feel" Oxygen just on 1 L, he has been up on bedside bench yesterday Good UOP with 8600 ml past 24 hrs Wt pending this am,  Assessment and Plan:  Acute respiratory failure with hypoxia Dyspnea on exertion/Orthopnea Fluid overload and weight gain w/ worsening leg edema: Presentation  consistent with acute hypoxic respiratory failure in the setting of fluid overload with super morbid obesity, echo limited but with normal EF, duplex no DVT.  Suspecting acute diastolic CHF and fluid overload.PCCM input appreciated.  Clinically improving hypoxia resolving fluid overload much better. Continue on IV Lasix 60mg  q12 hr. He is having good urine output net neg likely more > 17 l as initiall UOP not accurate.OOB, ambulation .Monitor daily weight, intake output, continue low-salt diet weight improving but I doubt initial weight to be accurate Net IO Since Admission: -17,710 mL [06/01/21 1005]  Filed Weights   05/29/21 1455 05/31/21 0500  Weight: (!) 308.4 kg (!) 307.5 kg     Hypertension, benign: Slightly higher side, allow blood pressure to be higher side to allow room for diuresis continue losartan   Borderline elevated D-dimer follow-up duplex negative for DVT.Patient does not fit in CT scan but low suspicion for PE.   Super Morbid obesity BMI 84-He says his wt around 675 lb around Jan 15, and had intentional lost 7b lb last year, and is on discussion for Bariatric surgery at [facility].  A1c , tsh pending lipid panel stable.   OSA on CPAP  cont CPAP qhs Prediabetes with A1c 5.7 definitely weight loss will be beneficial Cardiomegaly- echo limited   DVT prophylaxis:  Code Status:   Code Status: Full Code Family Communication: plan of care discussed with patient at bedside.  Disposition: Currently not medically stable for discharge. Status is: Inpatient Remains inpatient appropriate because: Ongoing management of hypoxia fluid overload.  We  will continue diuresis for the next 48 hours then can discharge on oral Lasix at least 40 or more.   Objective: Vitals last 24 hrs: Vitals:   05/31/21 2204 05/31/21 2312 06/01/21 0624 06/01/21 0745  BP: (!) 161/78  (!) 155/67 (!) 160/77  Pulse: 72 77 79 77  Resp: 20 17 18    Temp: 98 F (36.7 C)  97.8 F (36.6 C)   TempSrc: Oral  Oral    SpO2: 96% 96% 92% 94%  Weight:      Height:       Weight change:   Physical Examination: General exam: AA0X3,, pleasant, obese older than stated age, weak appearing. HEENT:Oral mucosa moist, Ear/Nose WNL grossly, dentition normal. Respiratory system: bilaterally diminished,no use of accessory muscle Cardiovascular system: S1 & S2 +, No JVD,. Gastrointestinal system: Abdomen soft,NT,ND, BS+ Nervous System:Alert, awake, moving extremities and grossly nonfocal Extremities: Extensive lower leg lymphedema with edema up to abdomen and thigh appears better compared to presentation  Skin: No rashes,no icterus. MSK: Normal muscle bulk,tone, power   Medications reviewed: Scheduled Meds:  enoxaparin (LOVENOX) injection  60 mg Subcutaneous Q12H   furosemide  60 mg Intravenous BID   losartan  100 mg Oral Daily   Continuous Infusions:    Diet Order             Diet 2 gram sodium Room service appropriate? Yes; Fluid consistency: Thin; Fluid restriction: 1200 mL Fluid  Diet effective now                    Intake/Output Summary (Last 24 hours) at 06/01/2021 1005 Last data filed at 06/01/2021 0835 Gross per 24 hour  Intake 600 ml  Output 8400 ml  Net -7800 ml   Net IO Since Admission: -17,710 mL [06/01/21 1005]  Wt Readings from Last 3 Encounters:  05/31/21 (!) 307.5 kg  04/12/13 (!) 222.3 kg  12/21/12 (!) 233.6 kg     Unresulted Labs (From admission, onward)     Start     Ordered   06/05/21 0500  Creatinine, serum  (enoxaparin (LOVENOX)    CrCl >/= 30 ml/min)  Weekly,   R     Comments: while on enoxaparin therapy    05/29/21 1725   05/31/21 [id number]  Basic metabolic panel  Daily,   R     Question:  Specimen collection method  Answer:  Lab=Lab collect   05/30/21 0728          Data Reviewed: I have personally reviewed following labs and imaging studies CBC: Recent Labs  Lab 05/29/21 1526 05/30/21 0326  WBC 8.2 7.5  NEUTROABS 6.3  --   HGB 13.9 13.1  HCT 46.3 44.8   MCV 97.3 99.8  PLT 239 [id number]   Basic Metabolic Panel: Recent Labs  Lab 05/29/21 1526 05/30/21 0326 05/31/21 0458 06/01/21 0620  NA 139 141 139 139  K 4.2 4.1 3.9 3.7  CL 101 101 95* 92*  CO2 35* 34* 38* 40*  GLUCOSE 109* 112* 107* 106*  BUN 18 17 16 16   CREATININE 0.68 0.76 0.83 0.73  CALCIUM 8.6* 8.5* 8.6* 8.8*  MG  --   --  2.1  --    GFR: Estimated Creatinine Clearance: 301.6 mL/min (by C-G formula based on SCr of 0.73 mg/dL). Liver Function Tests: Recent Labs  Lab 05/29/21 1526 05/30/21 0326  AST 35 26  ALT 55* 47*  ALKPHOS 69 63  BILITOT 0.5 0.3  PROT 6.8 6.3*  ALBUMIN  3.2* 3.0*   No results for input(s): LIPASE, AMYLASE in the last 168 hours. No results for input(s): AMMONIA in the last 168 hours. Coagulation Profile: No results for input(s): INR, PROTIME in the last 168 hours. Cardiac Enzymes: No results for input(s): CKTOTAL, CKMB, CKMBINDEX, TROPONINI in the last 168 hours. BNP (last 3 results) No results for input(s): PROBNP in the last 8760 hours. HbA1C: Recent Labs    05/31/21 0458  HGBA1C 5.7*   CBG: No results for input(s): GLUCAP in the last 168 hours. Lipid Profile: Recent Labs    05/30/21 0326  CHOL 126  HDL 25*  LDLCALC 84  TRIG 85  CHOLHDL 5.0   Thyroid Function Tests: Recent Labs    05/31/21 0458  TSH 1.205   Anemia Panel: No results for input(s): VITAMINB12, FOLATE, FERRITIN, TIBC, IRON, RETICCTPCT in the last 72 hours. Sepsis Labs: No results for input(s): PROCALCITON, LATICACIDVEN in the last 168 hours.  Recent Results (from the past 240 hour(s))  Resp Panel by RT-PCR (Flu A&B, Covid) Nasopharyngeal Swab     Status: None   Collection Time: 05/29/21  3:26 PM   Specimen: Nasopharyngeal Swab; Nasopharyngeal(NP) swabs in vial transport medium  Result Value Ref Range Status   SARS Coronavirus 2 by RT PCR NEGATIVE NEGATIVE Final    Comment: (NOTE) SARS-CoV-2 target nucleic acids are NOT DETECTED.  The SARS-CoV-2 RNA is  generally detectable in upper respiratory specimens during the acute phase of infection. The lowest concentration of SARS-CoV-2 viral copies this assay can detect is 138 copies/mL. A negative result does not preclude SARS-Cov-2 infection and should not be used as the sole basis for treatment or other patient management decisions. A negative result may occur with  improper specimen collection/handling, submission of specimen other than nasopharyngeal swab, presence of viral mutation(s) within the areas targeted by this assay, and inadequate number of viral copies(<138 copies/mL). A negative result must be combined with clinical observations, patient history, and epidemiological information. The expected result is Negative.  Fact Sheet for Patients:  EntrepreneurPulse.com.au  Fact Sheet for Healthcare Providers:  IncredibleEmployment.be  This test is no t yet approved or cleared by the [place] FDA and  has been authorized for detection and/or diagnosis of SARS-CoV-2 by FDA under an Emergency Use Authorization (EUA). This EUA will remain  in effect (meaning this test can be used) for the duration of the COVID-19 declaration under Section 564(b)(1) of the Act, 21 U.S.C.section 360bbb-3(b)(1), unless the authorization is terminated  or revoked sooner.       Influenza A by PCR NEGATIVE NEGATIVE Final   Influenza B by PCR NEGATIVE NEGATIVE Final    Comment: (NOTE) The Xpert Xpress SARS-CoV-2/FLU/RSV plus assay is intended as an aid in the diagnosis of influenza from Nasopharyngeal swab specimens and should not be used as a sole basis for treatment. Nasal washings and aspirates are unacceptable for Xpert Xpress SARS-CoV-2/FLU/RSV testing.  Fact Sheet for Patients: EntrepreneurPulse.com.au  Fact Sheet for Healthcare Providers: IncredibleEmployment.be  This test is not yet approved or cleared by the [place] FDA and has been authorized for detection and/or diagnosis of SARS-CoV-2 by FDA under an Emergency Use Authorization (EUA). This EUA will remain in effect (meaning this test can be used) for the duration of the COVID-19 declaration under Section 564(b)(1) of the Act, 21 U.S.C. section 360bbb-3(b)(1), unless the authorization is terminated or revoked.  Performed at [facility], [place] [address] [place], [place] [address]  Antimicrobials: Anti-infectives (From admission, onward)    None      Culture/Microbiology No results found for: SDES, SPECREQUEST, CULT, REPTSTATUS  Other culture-see note   Radiology Studies: ECHOCARDIOGRAM COMPLETE  Result Date: 05/30/2021    ECHOCARDIOGRAM REPORT   Patient Name:   RAMIER BELLERIVE Date of Exam: 05/30/2021 Medical Rec #:  [id number]     Height:       75.0 in Accession #:    [id number]    Weight:       680.0 lb Date of Birth:  09/20/1980     BSA:          3.691 m Patient Age:    76 years      BP:           149/71 mmHg Patient Gender: M             HR:           76 bpm. Exam Location:  Inpatient Procedure: 2D Echo, Limited Echo, Cardiac Doppler and Color Doppler Indications:    I50.31 CHF  History:        Patient has no prior history of Echocardiogram examinations.                 Risk Factors:Hypertension.  Sonographer:    Beryle Beams Referring Phys: [id number] Betsy Layne Rowland  Sonographer Comments: Suboptimal parasternal window, suboptimal apical window and patient is morbidly obese. Image acquisition challenging due to patient body habitus. PATIENT STATES WEIGHT APPROXIMATELY 690 LBS / DUE TO BODY HABITUS SUBOPTIMAL STUDY. IMPRESSIONS  1. Nondiagnostic study due to very poor echo windows and visualization of cardiac structures.  2. Unable to visualize LV endocardium, however, based on very limited views, LVEF appears to be within normal range 55-65%. Left ventricular endocardial border not optimally defined to evaluate regional wall  motion.  3. Right ventricular systolic function is normal. The right ventricular size is not well visualized.  4. The mitral valve was not well visualized.  5. The aortic valve was not well visualized.  6. The inferior vena cava is dilated in size with <50% respiratory variability, suggesting right atrial pressure of 15 mmHg. FINDINGS  Left Ventricle: Left ventricular endocardial border not optimally defined to evaluate regional wall motion. Suboptimal image quality limits for assessment of left ventricular hypertrophy. Right Ventricle: The right ventricular size is not well visualized. Right vetricular wall thickness was not well visualized. Right ventricular systolic function is normal. Left Atrium: Left atrial size was not well visualized. Right Atrium: Right atrial size was not well visualized. Pericardium: The pericardium was not well visualized. Mitral Valve: The mitral valve was not well visualized. Tricuspid Valve: The tricuspid valve is not well visualized. Aortic Valve: The aortic valve was not well visualized. Pulmonic Valve: The pulmonic valve was not well visualized. Pulmonic valve regurgitation is trivial. Aorta: The aortic root is normal in size and structure. Venous: The inferior vena cava is dilated in size with less than 50% respiratory variability, suggesting right atrial pressure of 15 mmHg. IAS/Shunts: The interatrial septum was not well visualized.  LEFT VENTRICLE PLAX 2D LVIDd:         6.10 cm      Diastology LVIDs:         4.10 cm      LV e' medial:    10.90 cm/s LV PW:         1.40 cm      LV E/e' medial:  10.7 LV IVS:  1.10 cm      LV e' lateral:   12.10 cm/s LVOT diam:     2.10 cm      LV E/e' lateral: 9.7 LVOT Area:     3.46 cm  LV Volumes (MOD) LV vol d, MOD A4C: 199.0 ml LV vol s, MOD A4C: 121.0 ml LV SV MOD A4C:     199.0 ml RIGHT VENTRICLE             IVC RV S prime:     13.60 cm/s  IVC diam: 3.50 cm TAPSE (M-mode): 3.0 cm LEFT ATRIUM         Index LA diam:    4.10 cm 1.11  cm/m                        PULMONIC VALVE AORTA                 PV Vmax:       0.99 m/s Ao Root diam: 2.70 cm PV Vmean:      62.400 cm/s Ao Asc diam:  3.50 cm PV VTI:        0.230 m                       PV Peak grad:  3.9 mmHg                       PV Mean grad:  2.0 mmHg  MITRAL VALVE MV Area (PHT): 4.21 cm     SHUNTS MV Decel Time: 180 msec     Systemic Diam: 2.10 cm MV E velocity: 117.00 cm/s MV A velocity: 74.40 cm/s MV E/A ratio:  1.57 Gwyndolyn Kaufman MD Electronically signed by Gwyndolyn Kaufman MD Signature Date/Time: 05/30/2021/12:20:03 PM    Final      LOS: 3 days   Antonieta Pert, MD [facility]  06/01/2021, 10:05 AM

## 2021-06-01 NOTE — Evaluation (Signed)
Physical Therapy Evaluation Patient Details Name: Willie Steele MRN: [id number] DOB: Sep 17, 1980 Today's Date: 06/01/2021  History of Present Illness  Pt admitted from home 2* acute respiratory failure with hypoxia and fluid overload with increased LE edema.  Pt with hx of super morbid obesity - 675lbs  Clinical Impression  Pt admitted as above and very motivated to mobilize.  Pt demonstrates IND in transfers and ambulation (assist for O2 tank only) but requires use of bed rail and min assist to move to sitting in bari-bed.  Pt states bed at home is not difficult for him but the nature of the bari-bed "feels like a trap".  Pt ambulated 200' without assist but requiring several short standing rest breaks 2* SOB and did desat to 84% on 2L but with rapid recovery.  Pt states breathing much improved from time of admit but not to baseline yet.  Discussed with RN and PT will sign off services and pt to continue mobilizing with nursing and mobility team.       Recommendations for follow up therapy are one component of a multi-disciplinary discharge planning process, led by the attending physician.  Recommendations may be updated based on patient status, additional functional criteria and insurance authorization.  Follow Up Recommendations No PT follow up    Assistance Recommended at Discharge None  Patient can return home with the following       Equipment Recommendations None recommended by PT  Recommendations for Other Services       Functional Status Assessment       Precautions / Restrictions Precautions Precautions: Other (comment) Precaution Comments: watch O2 sats Restrictions Weight Bearing Restrictions: No      Mobility  Bed Mobility Overal bed mobility: Needs Assistance Bed Mobility: Supine to Sit     Supine to sit: Min assist, +2 for safety/equipment, +2 for physical assistance     General bed mobility comments: min assist to move to sitting on bari-bed     Transfers Overall transfer level: Modified independent Equipment used: None Transfers: Sit to/from Stand Sit to Stand: Modified independent (Device/Increase time)                Ambulation/Gait Ambulation/Gait assistance: Independent Gait Distance (Feet): 200 Feet Assistive device: None Gait Pattern/deviations: Step-through pattern, Decreased step length - right, Decreased step length - left, Shuffle, Wide base of support Gait velocity: mod pace     General Gait Details: No physical assist but several short standing rest breaks 2* SOB - desat to 84% on 2L  Stairs            Wheelchair Mobility    Modified Rankin (Stroke Patients Only)       Balance Overall balance assessment: Mild deficits observed, not formally tested                                           Pertinent Vitals/Pain Pain Assessment Pain Assessment: No/denies pain    Home Living Family/patient expects to be discharged to:: Private residence Living Arrangements: Alone Available Help at Discharge: Friend(s);Available PRN/intermittently Type of Home: Apartment Home Access: Elevator       Home Layout: One level Home Equipment: None      Prior Function Prior Level of Function : Independent/Modified Independent                     Hand Dominance  Extremity/Trunk Assessment   Upper Extremity Assessment Upper Extremity Assessment: Overall WFL for tasks assessed    Lower Extremity Assessment Lower Extremity Assessment: Overall WFL for tasks assessed    Cervical / Trunk Assessment Cervical / Trunk Assessment: Normal  Communication   Communication: No difficulties  Cognition Arousal/Alertness: Awake/alert Behavior During Therapy: WFL for tasks assessed/performed Overall Cognitive Status: Within Functional Limits for tasks assessed                                          General Comments      Exercises      Assessment/Plan    PT Assessment Patient needs continued PT services  PT Problem List Decreased activity tolerance;Obesity       PT Treatment Interventions Gait training    PT Goals (Current goals can be found in the Care Plan section)  Acute Rehab PT Goals Patient Stated Goal: HOME and breathing better PT Goal Formulation: All assessment and education complete, DC therapy    Frequency Min 1X/week     Co-evaluation               AM-PAC PT "6 Clicks" Mobility  Outcome Measure Help needed turning from your back to your side while in a flat bed without using bedrails?: A Little Help needed moving from lying on your back to sitting on the side of a flat bed without using bedrails?: A Little Help needed moving to and from a bed to a chair (including a wheelchair)?: A Little Help needed standing up from a chair using your arms (e.g., wheelchair or bedside chair)?: None Help needed to walk in hospital room?: None Help needed climbing 3-5 steps with a railing? : A Little 6 Click Score: 20    End of Session Equipment Utilized During Treatment: Oxygen Activity Tolerance: Patient tolerated treatment well Patient left: in chair;with call bell/phone within reach;with family/visitor present Nurse Communication: Mobility status PT Visit Diagnosis: Difficulty in walking, not elsewhere classified (R26.2)    Time: 1105-1130 PT Time Calculation (min) (ACUTE ONLY): 25 min   Charges:   PT Evaluation $PT Eval Low Complexity: 1 Low          Mauro Kaufmann PT Acute Rehabilitation Services Pager [phone] Office [phone]   Domanick Cuccia 06/01/2021, 12:45 PM

## 2021-06-02 DIAGNOSIS — I5031 Acute diastolic (congestive) heart failure: Secondary | ICD-10-CM | POA: Diagnosis present

## 2021-06-02 DIAGNOSIS — Z9989 Dependence on other enabling machines and devices: Secondary | ICD-10-CM

## 2021-06-02 DIAGNOSIS — I517 Cardiomegaly: Secondary | ICD-10-CM

## 2021-06-02 DIAGNOSIS — G4733 Obstructive sleep apnea (adult) (pediatric): Secondary | ICD-10-CM

## 2021-06-02 DIAGNOSIS — R7303 Prediabetes: Secondary | ICD-10-CM

## 2021-06-02 DIAGNOSIS — I10 Essential (primary) hypertension: Secondary | ICD-10-CM

## 2021-06-02 LAB — BASIC METABOLIC PANEL
Anion gap: 6 (ref 5–15)
BUN: 18 mg/dL (ref 6–20)
CO2: 39 mmol/L — ABNORMAL HIGH (ref 22–32)
Calcium: 8.9 mg/dL (ref 8.9–10.3)
Chloride: 92 mmol/L — ABNORMAL LOW (ref 98–111)
Creatinine, Ser: 0.88 mg/dL (ref 0.61–1.24)
GFR, Estimated: >60 mL/min (ref >60)
Glucose, Bld: 114 mg/dL — ABNORMAL HIGH (ref 70–99)
Potassium: 3.6 mmol/L (ref 3.5–5.1)
Sodium: 137 mmol/L (ref 135–145)

## 2021-06-02 MED ORDER — POTASSIUM CHLORIDE CRYS ER 20 MEQ PO TBCR
40.0000 meq | EXTENDED_RELEASE_TABLET | Freq: Once | ORAL | Status: AC
  Administered 2021-06-02: 40 meq via ORAL
  Filled 2021-06-02: qty 2

## 2021-06-02 MED ORDER — METOLAZONE 2.5 MG PO TABS
2.5000 mg | ORAL_TABLET | Freq: Once | ORAL | Status: AC
  Administered 2021-06-02: 2.5 mg via ORAL
  Filled 2021-06-02: qty 1

## 2021-06-02 NOTE — Assessment & Plan Note (Addendum)
With possible diastolic heart failure.  Continue diuretics on discharge

## 2021-06-02 NOTE — Assessment & Plan Note (Addendum)
Patient was significantly fluid overloaded.  Received IV diuretics during hospitalization with significant urinary output..  Intake and output noted with total of -44010 mL.  Patient was advised on fluid restriction to 1500 MLS per day, low-salt diet Daily weights on discharge and all questions were answered.  RN was instructed on providing CHF education including pamphlet on discharge.  Patient will be changed to torsemide on discharge and losartan has been added to the regimen..  Patient will have to closely follow-up with his primary care physician as outpatient to titrate his medications.Marland Kitchen

## 2021-06-02 NOTE — Progress Notes (Addendum)
PROGRESS NOTE    Willie Steele  [id number] DOB: February 24, 1981 DOA: 05/29/2021 PCP: Bernerd Limbo, MD    Brief Narrative:  41 year old male with super morbid obesity BMI 84, OSA on cpap-compliant,history of hypertension presented hospital with shortness of breath and hypoxia with worsening swelling of his lower extremities and weight gain.  He was seen in his PCPs office and was noted to be hypoxic with pulse ox of 80% on room air complained of orthopnea.  In the ED hypertensive up to 205/113, saturation improved to 91 to 100% on 3 L nasal cannula.CXR showed cardiomegaly with interstitial densities.  EKG showed sinus tachycardia.  Lasix IV was given and patient was admitted to hospital for further evaluation and treatment.     Assessment and Plan: * Acute respiratory failure with hypoxia ([place])- (present on admission) Likely multifactorial secondary to fluid overload, super morbid obesity.  2D echocardiogram limited but normal EF.  Duplex with no DVT.  Possible acute diastolic congestive heart failure.  Seen by pulmonary.  Currently on IV Lasix 60 mg every 12hrly.  Continue to monitor closely.  Strict intake and output charting.  Acute diastolic congestive heart failure ([place])- (present on admission) Patient was significantly fluid overloaded.  On IV diuretics.  Intake and output noted with total of -09811 mL.  Still on IV diuretics with Lasix.  Continue to monitor renal function.  Cardiomegaly With possible diastolic heart failure.  Continue diuretics.  Morbid obesity ([place])- (present on admission) Super morbid obesity.  BMI of 84.  Will benefit from weight loss as outpatient.  He is on discussion for bariatric surgery at [facility].  Hypertension, benign- (present on admission) Blood pressure was accelerated on presentation. Continue  hydralazine as needed, losartan.  Blood pressure still on the higher side.  We will continue to monitor.    DVT prophylaxis:    Eliquis  Code Status:      Code Status: Full Code  Disposition: Home likely in 1 to 2 days  Status is: Inpatient  Remains inpatient appropriate because: IV diuretics,   Family Communication: Spoke with the patient at bedside and also spoke with the patient's mother on the phone and updated her about the clinical condition of the patient.  Consultants:  PCCM  Procedures:  None  Antimicrobials:  None  Anti-infectives (From admission, onward)    None       Subjective: Today, patient was seen and examined at bedside.  Patient states that he feels little better with improved shortness of breath and leg edema.  Still on supplemental oxygen.  Improved exercise tolerance but not quite yet  Objective: Vitals:   06/01/21 2329 06/02/21 0553 06/02/21 1209 06/02/21 1307  BP:  (!) 173/68  (!) 146/83  Pulse: 83 77  94  Resp: 19 18    Temp:  97.7 F (36.5 C)  99.3 F (37.4 C)  TempSrc:  Axillary  Oral  SpO2: 94% 95%  90%  Weight:   (!) 307.7 kg   Height:        Intake/Output Summary (Last 24 hours) at 06/02/2021 1703 Last data filed at 06/02/2021 1330 Gross per 24 hour  Intake 715 ml  Output 6425 ml  Net -5710 ml   Filed Weights   05/29/21 1455 05/31/21 0500 06/02/21 1209  Weight: (!) 308.4 kg (!) 307.5 kg (!) 307.7 kg   Body mass index is 84.78 kg/m.   Physical Examination:  General: Super morbid obesity, not in obvious distress, on nasal cannula oxygen HENT:  No scleral pallor or icterus noted. Oral mucosa is moist.  Chest:   Diminished breath sounds bilaterally.  CVS: S1 &S2 heard. No murmur.  Regular rate and rhythm. Abdomen: Soft, nontender, nondistended.  Bowel sounds are heard.   Extremities: No cyanosis, clubbing with bilateral lower extremity edema.  Peripheral pulses are palpable. Psych: Alert, awake and oriented, normal mood CNS:  No cranial nerve deficits.  Power equal in all extremities.   Skin: Warm and dry.  No rashes noted.  Data Reviewed:   CBC: Recent Labs  Lab  05/29/21 1526 05/30/21 0326  WBC 8.2 7.5  NEUTROABS 6.3  --   HGB 13.9 13.1  HCT 46.3 44.8  MCV 97.3 99.8  PLT 239 [id number]    Basic Metabolic Panel: Recent Labs  Lab 05/29/21 1526 05/30/21 0326 05/31/21 0458 06/01/21 0620 06/02/21 0507  NA 139 141 139 139 137  K 4.2 4.1 3.9 3.7 3.6  CL 101 101 95* 92* 92*  CO2 35* 34* 38* 40* 39*  GLUCOSE 109* 112* 107* 106* 114*  BUN 18 17 16 16 18   CREATININE 0.68 0.76 0.83 0.73 0.88  CALCIUM 8.6* 8.5* 8.6* 8.8* 8.9  MG  --   --  2.1  --   --     Liver Function Tests: Recent Labs  Lab 05/29/21 1526 05/30/21 0326  AST 35 26  ALT 55* 47*  ALKPHOS 69 63  BILITOT 0.5 0.3  PROT 6.8 6.3*  ALBUMIN 3.2* 3.0*     Radiology Studies: No results found.    LOS: 4 days    Flora Lipps, MD [facility] 06/02/2021, 5:03 PM

## 2021-06-02 NOTE — Assessment & Plan Note (Addendum)
Super morbid obesity.  BMI of 84.  Will benefit from weight loss as outpatient.  He is on discussion for bariatric surgery at [facility].

## 2021-06-02 NOTE — Assessment & Plan Note (Addendum)
Blood pressure was accelerated on presentation.  Blood pressure has improved at this time.  On losartan.  Patient will be on diuretics as well.

## 2021-06-02 NOTE — Assessment & Plan Note (Addendum)
Likely multifactorial secondary to fluid overload, super morbid obesity.  2D echocardiogram limited but normal EF.  Duplex with no DVT.  Possible acute diastolic congestive heart failure.  Patient was also seen by pulmonary during hospitalization..  Patient received IV Lasix 60 mg every 12hrly with significant diuresis.  At this time patient has been weaned off of oxygen.Marland Kitchen

## 2021-06-03 LAB — MAGNESIUM: Magnesium: 2.3 mg/dL (ref 1.7–2.4)

## 2021-06-03 LAB — COMPREHENSIVE METABOLIC PANEL
ALT: 47 U/L — ABNORMAL HIGH (ref 0–44)
AST: 40 U/L (ref 15–41)
Albumin: 3.2 g/dL — ABNORMAL LOW (ref 3.5–5.0)
Alkaline Phosphatase: 64 U/L (ref 38–126)
Anion gap: 8 (ref 5–15)
BUN: 23 mg/dL — ABNORMAL HIGH (ref 6–20)
CO2: 39 mmol/L — ABNORMAL HIGH (ref 22–32)
Calcium: 9.1 mg/dL (ref 8.9–10.3)
Chloride: 90 mmol/L — ABNORMAL LOW (ref 98–111)
Creatinine, Ser: 1.06 mg/dL (ref 0.61–1.24)
GFR, Estimated: >60 mL/min (ref >60)
Glucose, Bld: 123 mg/dL — ABNORMAL HIGH (ref 70–99)
Potassium: 4.3 mmol/L (ref 3.5–5.1)
Sodium: 137 mmol/L (ref 135–145)
Total Bilirubin: 1.2 mg/dL (ref 0.3–1.2)
Total Protein: 7.2 g/dL (ref 6.5–8.1)

## 2021-06-03 LAB — CBC
HCT: 48.9 % (ref 39.0–52.0)
Hemoglobin: 14.8 g/dL (ref 13.0–17.0)
MCH: 29.1 pg (ref 26.0–34.0)
MCHC: 30.3 g/dL (ref 30.0–36.0)
MCV: 96.1 fL (ref 80.0–100.0)
Platelets: 239 10*3/uL (ref 150–400)
RBC: 5.09 MIL/uL (ref 4.22–5.81)
RDW: 15.9 % — ABNORMAL HIGH (ref 11.5–15.5)
WBC: 9.3 10*3/uL (ref 4.0–10.5)
nRBC: 0 % (ref 0.0–0.2)

## 2021-06-03 MED ORDER — TORSEMIDE 20 MG PO TABS
20.0000 mg | ORAL_TABLET | Freq: Every day | ORAL | 2 refills | Status: DC
Start: 2021-06-03 — End: 2023-03-10

## 2021-06-03 NOTE — Assessment & Plan Note (Signed)
Hemoglobin A1c from 05/31/2021 was 5.7.  Counseled about weight loss as outpatient.

## 2021-06-03 NOTE — Progress Notes (Signed)
Mobility Specialist - Cancellation Note   06/03/21 1124  Mobility  Activity Refused mobility   Pt refused mobility at this time. Stated he is expecting to d/c soon and would prefer not to mobilize.   [place] Specialist [facility] Phone: [phone] 06/03/21, 11:25 AM

## 2021-06-03 NOTE — Discharge Summary (Signed)
Physician Discharge Summary   Patient: Willie Steele MRN: [id number] DOB: Aug 18, 1980  Admit date:     05/29/2021  Discharge date: 06/03/21  Discharge Physician: Joycelyn Das   PCP: Tracey Harries, MD   Recommendations at discharge:   Follow-up with your primary care physician in 1 week.  Check blood work at that time.  Patient might need adjustment in his diuretic regimen as outpatient. Patient would benefit from bariatric surgery consideration as outpatient.  Discharge Diagnoses: Principal Problem:   Acute respiratory failure with hypoxia (HCC) Active Problems:   Acute diastolic congestive heart failure (HCC)   Hypertension, benign   Morbid obesity (HCC)   Cardiomegaly   OSA on CPAP   Prediabetes  Resolved Problems:   * No resolved hospital problems. *   Hospital Course: 41 year old male with super morbid obesity BMI 84, OSA on cpap-compliant,history of hypertension presented hospital with shortness of breath and hypoxia with worsening swelling of his lower extremities and weight gain.  He was seen in his PCPs office and was noted to be hypoxic with pulse ox of 80% on room air complained of orthopnea.  In the ED, patient was noted to be hypertensive up to 205/113, oxygen saturation improved to 91 to 100% on 3 L nasal cannula. CXR showed cardiomegaly with interstitial densities.  EKG showed sinus tachycardia.  Lasix IV was given and patient was admitted to hospital for further evaluation and treatment.  The following conditions were addressed during hospitalization,  Assessment and Plan: * Acute respiratory failure with hypoxia (HCC)- (present on admission) Likely multifactorial secondary to fluid overload, super morbid obesity.  2D echocardiogram limited but normal EF.  Duplex with no DVT.  Possible acute diastolic congestive heart failure.  Patient was also seen by pulmonary during hospitalization..  Patient received IV Lasix 60 mg every 12hrly with significant diuresis.  At  this time patient has been weaned off of oxygen..  Acute diastolic congestive heart failure (HCC)- (present on admission) Patient was significantly fluid overloaded.  Received IV diuretics during hospitalization with significant urinary output..  Intake and output noted with total of -37482 mL.  Patient was advised on fluid restriction to 1500 MLS per day, low-salt diet Daily weights on discharge and all questions were answered.  RN was instructed on providing CHF education including pamphlet on discharge.  Patient will be changed to torsemide on discharge and losartan has been added to the regimen..  Patient will have to closely follow-up with his primary care physician as outpatient to titrate his medications.  Patient has achieved a significant diuresis and is compensated at this time.  Off oxygen.  Has been weaned off oxygen.  Prediabetes Hemoglobin A1c from 05/31/2021 was 5.7.  Counseled about weight loss as outpatient.  Cardiomegaly With possible diastolic heart failure.  Continue diuretics on discharge  Morbid obesity (HCC)- (present on admission) Super morbid obesity.  BMI of 84.  Will benefit from weight loss as outpatient.  He is on discussion for bariatric surgery at [facility].  Hypertension, benign- (present on admission) Blood pressure was accelerated on presentation.  Blood pressure has improved at this time.  On losartan.  Patient will be on diuretics as well.   Consultants: Pulmonary Procedures performed: None Disposition: Home Diet recommendation:  Discharge Diet Orders (From admission, onward)     Start     Ordered   06/03/21 0000  Diet - low sodium heart healthy        06/03/21 1037  Cardiac diet  DISCHARGE MEDICATION: Allergies as of 06/03/2021       Reactions   Sulfa Antibiotics Hives, Rash        Medication List     STOP taking these medications    furosemide 20 MG tablet Commonly known as: LASIX   ibuprofen 200 MG tablet Commonly known  as: ADVIL       TAKE these medications    acetaminophen 500 MG tablet Commonly known as: TYLENOL Take 2,000 mg by mouth daily as needed for moderate pain.   docusate sodium 100 MG capsule Commonly known as: COLACE Take 400 mg by mouth daily as needed for moderate constipation.   losartan 100 MG tablet Commonly known as: COZAAR Take 100 mg by mouth daily.   OVER THE COUNTER MEDICATION Take 2 tablets by mouth at bedtime. Zquil   torsemide 20 MG tablet Commonly known as: DEMADEX Take 1 tablet (20 mg total) by mouth daily.        Follow-up Information     Tracey Harries, MD Follow up in 1 week(s).   Specialty: Family Medicine Contact information: [address] [facility] [address] [phone]                Subjective Today, patient was seen and examined at bedside.  Patient continues to feel better with breathing.  Has lost a significant amount of weight with brisk diuresis.  Discharge Exam: Filed Weights   05/29/21 1455 05/31/21 0500 06/02/21 1209  Weight: (!) 308.4 kg (!) 307.5 kg (!) 307.7 kg   General: Morbidly obese, not in obvious distress HENT:   No scleral pallor or icterus noted. Oral mucosa is moist.  Chest:    Diminished breath sounds bilaterally. No crackles or wheezes.  CVS: S1 &S2 heard. No murmur.  Regular rate and rhythm. Abdomen: Soft, nontender, nondistended.  Bowel sounds are heard.   Extremities: No cyanosis, clubbing with bilateral lower extremity mild edema with puckering of skin.  Peripheral pulses are palpable. Psych: Alert, awake and oriented, normal mood CNS:  No cranial nerve deficits.  Power equal in all extremities.   Skin: Warm and dry.  No rashes noted.   Condition at discharge: good  The results of significant diagnostics from this hospitalization (including imaging, microbiology, ancillary and laboratory) are listed below for reference.   Imaging Studies: DG Chest 1  View  Result Date: 05/29/2021 CLINICAL DATA:  Shortness of breath.  Lower extremity swelling. EXAM: CHEST  1 VIEW COMPARISON:  01/31/2004 FINDINGS: The cardiac silhouette is mildly enlarged. There is pulmonary vascular congestion with diffusely increased interstitial markings in both lungs. No focal airspace consolidation, large pleural effusion, or pneumothorax is identified. IMPRESSION: Cardiomegaly and bilateral interstitial densities, likely edema Electronically Signed   By: Sebastian Ache M.D.   On: 05/29/2021 15:58   ECHOCARDIOGRAM COMPLETE  Result Date: 05/30/2021    ECHOCARDIOGRAM REPORT   Patient Name:   Willie Steele Date of Exam: 05/30/2021 Medical Rec #:  [id number]     Height:       75.0 in Accession #:    [id number]    Weight:       680.0 lb Date of Birth:  11-15-1980     BSA:          3.691 m Patient Age:    41 years      BP:           149/71 mmHg Patient Gender: M  HR:           76 bpm. Exam Location:  Inpatient Procedure: 2D Echo, Limited Echo, Cardiac Doppler and Color Doppler Indications:    I50.31 CHF  History:        Patient has no prior history of Echocardiogram examinations.                 Risk Factors:Hypertension.  Sonographer:    Festus Barren Referring Phys: [id number] RAMESH KC  Sonographer Comments: Suboptimal parasternal window, suboptimal apical window and patient is morbidly obese. Image acquisition challenging due to patient body habitus. PATIENT STATES WEIGHT APPROXIMATELY 690 LBS / DUE TO BODY HABITUS SUBOPTIMAL STUDY. IMPRESSIONS  1. Nondiagnostic study due to very poor echo windows and visualization of cardiac structures.  2. Unable to visualize LV endocardium, however, based on very limited views, LVEF appears to be within normal range 55-65%. Left ventricular endocardial border not optimally defined to evaluate regional wall motion.  3. Right ventricular systolic function is normal. The right ventricular size is not well visualized.  4. The mitral valve was not well  visualized.  5. The aortic valve was not well visualized.  6. The inferior vena cava is dilated in size with <50% respiratory variability, suggesting right atrial pressure of 15 mmHg. FINDINGS  Left Ventricle: Left ventricular endocardial border not optimally defined to evaluate regional wall motion. Suboptimal image quality limits for assessment of left ventricular hypertrophy. Right Ventricle: The right ventricular size is not well visualized. Right vetricular wall thickness was not well visualized. Right ventricular systolic function is normal. Left Atrium: Left atrial size was not well visualized. Right Atrium: Right atrial size was not well visualized. Pericardium: The pericardium was not well visualized. Mitral Valve: The mitral valve was not well visualized. Tricuspid Valve: The tricuspid valve is not well visualized. Aortic Valve: The aortic valve was not well visualized. Pulmonic Valve: The pulmonic valve was not well visualized. Pulmonic valve regurgitation is trivial. Aorta: The aortic root is normal in size and structure. Venous: The inferior vena cava is dilated in size with less than 50% respiratory variability, suggesting right atrial pressure of 15 mmHg. IAS/Shunts: The interatrial septum was not well visualized.  LEFT VENTRICLE PLAX 2D LVIDd:         6.10 cm      Diastology LVIDs:         4.10 cm      LV e' medial:    10.90 cm/s LV PW:         1.40 cm      LV E/e' medial:  10.7 LV IVS:        1.10 cm      LV e' lateral:   12.10 cm/s LVOT diam:     2.10 cm      LV E/e' lateral: 9.7 LVOT Area:     3.46 cm  LV Volumes (MOD) LV vol d, MOD A4C: 199.0 ml LV vol s, MOD A4C: 121.0 ml LV SV MOD A4C:     199.0 ml RIGHT VENTRICLE             IVC RV S prime:     13.60 cm/s  IVC diam: 3.50 cm TAPSE (M-mode): 3.0 cm LEFT ATRIUM         Index LA diam:    4.10 cm 1.11 cm/m                        PULMONIC VALVE AORTA  PV Vmax:       0.99 m/s Ao Root diam: 2.70 cm PV Vmean:      62.400 cm/s Ao Asc  diam:  3.50 cm PV VTI:        0.230 m                       PV Peak grad:  3.9 mmHg                       PV Mean grad:  2.0 mmHg  MITRAL VALVE MV Area (PHT): 4.21 cm     SHUNTS MV Decel Time: 180 msec     Systemic Diam: 2.10 cm MV E velocity: 117.00 cm/s MV A velocity: 74.40 cm/s MV E/A ratio:  1.57 Laurance Flatten MD Electronically signed by Laurance Flatten MD Signature Date/Time: 05/30/2021/12:20:03 PM    Final    VAS [place] LOWER EXTREMITY VENOUS (DVT) (ONLY [facility] & [facility])  Result Date: 05/30/2021  Lower Venous DVT Study Patient Name:  LEX LINHARES  Date of Exam:   05/30/2021 Medical Rec #: [id number]      Accession #:    [id number] Date of Birth: June 01, 1980      Patient Gender: M Patient Age:   16 years Exam Location:  [facility] Procedure:      VAS [place] LOWER EXTREMITY VENOUS (DVT) Referring Phys: JOSHUA LONG --------------------------------------------------------------------------------  Indications: SOB, and Edema.  Limitations: Body habitus (BMI 85) and poor ultrasound/tissue interface. Comparison Study: No previous exams Performing Technologist: Jody Hill RVT, RDMS  Examination Guidelines: A complete evaluation includes B-mode imaging, spectral Doppler, color Doppler, and power Doppler as needed of all accessible portions of each vessel. Bilateral testing is considered an integral part of a complete examination. Limited examinations for reoccurring indications may be performed as noted. The reflux portion of the exam is performed with the patient in reverse Trendelenburg.  +---------+---------------+---------+-----------+----------+-------------------+  RIGHT     Compressibility Phasicity Spontaneity Properties Thrombus Aging       +---------+---------------+---------+-----------+----------+-------------------+  CFV       Full            Yes       Yes                                         +---------+---------------+---------+-----------+----------+-------------------+  SFJ       Full                                                                   +---------+---------------+---------+-----------+----------+-------------------+  FV Prox   Full            Yes       Yes                                         +---------+---------------+---------+-----------+----------+-------------------+  FV Mid    Full            Yes       Yes                                         +---------+---------------+---------+-----------+----------+-------------------+  FV Distal                 Yes       Yes                    patent by color and                                                              doppler              +---------+---------------+---------+-----------+----------+-------------------+  PFV       Full                                                                  +---------+---------------+---------+-----------+----------+-------------------+  POP       Full            Yes       Yes                                         +---------+---------------+---------+-----------+----------+-------------------+  PTV                                                        Not visualized       +---------+---------------+---------+-----------+----------+-------------------+  PERO                                                       Not visualized       +---------+---------------+---------+-----------+----------+-------------------+   Right Technical Findings: Not visualized segments include posterior tibial and peroneal veins.  +---------+---------------+---------+-----------+----------+--------------+  LEFT      Compressibility Phasicity Spontaneity Properties Thrombus Aging  +---------+---------------+---------+-----------+----------+--------------+  CFV       Full            Yes       Yes                                    +---------+---------------+---------+-----------+----------+--------------+  SFJ       Full                                                              +---------+---------------+---------+-----------+----------+--------------+  FV Prox   Full            Yes       Yes                                    +---------+---------------+---------+-----------+----------+--------------+  FV Mid    Full            Yes       Yes                                    +---------+---------------+---------+-----------+----------+--------------+  FV Distal                                                  Not visualized  +---------+---------------+---------+-----------+----------+--------------+  PFV       Full                                                             +---------+---------------+---------+-----------+----------+--------------+  POP       Full            Yes       Yes                                    +---------+---------------+---------+-----------+----------+--------------+  PTV                                                        Not visualized  +---------+---------------+---------+-----------+----------+--------------+  PERO                                                       Not visualized  +---------+---------------+---------+-----------+----------+--------------+   Left Technical Findings: Not visualized segments include distal femoral, posterior tibial, and peroneal veins.   Summary: BILATERAL: -No evidence of popliteal cyst, bilaterally. RIGHT: - There is no evidence of deep vein thrombosis in the lower extremity. However, portions of this examination were limited- see technologist comments above.  LEFT: - There is no evidence of deep vein thrombosis in the lower extremity. However, portions of this examination were limited- see technologist comments above.  *See table(s) above for measurements and observations. Electronically signed by Heath Larkhomas Hawken on 05/30/2021 at 2:17:32 PM.    Final     Microbiology: Results for orders placed or performed during the hospital encounter of 05/29/21  Resp Panel by RT-PCR (Flu A&B, Covid) Nasopharyngeal Swab      Status: None   Collection Time: 05/29/21  3:26 PM   Specimen: Nasopharyngeal Swab; Nasopharyngeal(NP) swabs in vial transport medium  Result Value Ref Range Status   SARS Coronavirus 2 by RT PCR NEGATIVE NEGATIVE Final    Comment: (NOTE) SARS-CoV-2 target nucleic acids are NOT DETECTED.  The SARS-CoV-2 RNA is generally detectable in upper respiratory specimens during the acute phase of infection. The lowest concentration of SARS-CoV-2 viral copies this assay can detect is 138 copies/mL. A negative result does not preclude SARS-Cov-2 infection and should not be used as the sole  basis for treatment or other patient management decisions. A negative result may occur with  improper specimen collection/handling, submission of specimen other than nasopharyngeal swab, presence of viral mutation(s) within the areas targeted by this assay, and inadequate number of viral copies(<138 copies/mL). A negative result must be combined with clinical observations, patient history, and epidemiological information. The expected result is Negative.  Fact Sheet for Patients:  BloggerCourse.comhttps://www.fda.gov/media/152166/download  Fact Sheet for Healthcare Providers:  SeriousBroker.ithttps://www.fda.gov/media/152162/download  This test is no t yet approved or cleared by the [place] FDA and  has been authorized for detection and/or diagnosis of SARS-CoV-2 by FDA under an Emergency Use Authorization (EUA). This EUA will remain  in effect (meaning this test can be used) for the duration of the COVID-19 declaration under Section 564(b)(1) of the Act, 21 U.S.C.section 360bbb-3(b)(1), unless the authorization is terminated  or revoked sooner.       Influenza A by PCR NEGATIVE NEGATIVE Final   Influenza B by PCR NEGATIVE NEGATIVE Final    Comment: (NOTE) The Xpert Xpress SARS-CoV-2/FLU/RSV plus assay is intended as an aid in the diagnosis of influenza from Nasopharyngeal swab specimens and should not be used as a sole basis  for treatment. Nasal washings and aspirates are unacceptable for Xpert Xpress SARS-CoV-2/FLU/RSV testing.  Fact Sheet for Patients: BloggerCourse.comhttps://www.fda.gov/media/152166/download  Fact Sheet for Healthcare Providers: SeriousBroker.ithttps://www.fda.gov/media/152162/download  This test is not yet approved or cleared by the [place] FDA and has been authorized for detection and/or diagnosis of SARS-CoV-2 by FDA under an Emergency Use Authorization (EUA). This EUA will remain in effect (meaning this test can be used) for the duration of the COVID-19 declaration under Section 564(b)(1) of the Act, 21 U.S.C. section 360bbb-3(b)(1), unless the authorization is terminated or revoked.  Performed at [facility], [address] [place], [place] [id number]     Labs: CBC: Recent Labs  Lab 05/29/21 1526 05/30/21 0326 06/03/21 0433  WBC 8.2 7.5 9.3  NEUTROABS 6.3  --   --   HGB 13.9 13.1 14.8  HCT 46.3 44.8 48.9  MCV 97.3 99.8 96.1  PLT 239 235 239   Basic Metabolic Panel: Recent Labs  Lab 05/30/21 0326 05/31/21 0458 06/01/21 0620 06/02/21 0507 06/03/21 0433  NA 141 139 139 137 137  K 4.1 3.9 3.7 3.6 4.3  CL 101 95* 92* 92* 90*  CO2 34* 38* 40* 39* 39*  GLUCOSE 112* 107* 106* 114* 123*  BUN 17 16 16 18  23*  CREATININE 0.76 0.83 0.73 0.88 1.06  CALCIUM 8.5* 8.6* 8.8* 8.9 9.1  MG  --  2.1  --   --  2.3   Liver Function Tests: Recent Labs  Lab 05/29/21 1526 05/30/21 0326 06/03/21 0433  AST 35 26 40  ALT 55* 47* 47*  ALKPHOS 69 63 64  BILITOT 0.5 0.3 1.2  PROT 6.8 6.3* 7.2  ALBUMIN 3.2* 3.0* 3.2*   CBG: No results for input(s): GLUCAP in the last 168 hours.  Discharge time spent: greater than 30 minutes.  Signed: Joycelyn DasLaxman Zyshawn Bohnenkamp, MD [facility] 06/03/2021

## 2021-06-20 ENCOUNTER — Telehealth (HOSPITAL_COMMUNITY): Payer: Self-pay | Admitting: Internal Medicine

## 2021-06-20 NOTE — Telephone Encounter (Signed)
Pt called to f/u w/referral for [facility], Dr Everlene Other, ;please advise ?

## 2021-06-27 NOTE — Telephone Encounter (Signed)
Does not meet criteria for [facility], ref forwarded to [facility] ?

## 2021-07-08 DIAGNOSIS — I5032 Chronic diastolic (congestive) heart failure: Secondary | ICD-10-CM | POA: Diagnosis present

## 2021-07-10 ENCOUNTER — Telehealth: Payer: Self-pay

## 2021-07-10 NOTE — Telephone Encounter (Signed)
REFERRAL SCANNED TO REFERRAL ?

## 2021-08-12 ENCOUNTER — Ambulatory Visit: Payer: Managed Care, Other (non HMO) | Admitting: Cardiovascular Disease

## 2021-08-26 NOTE — Progress Notes (Deleted)
CARDIOLOGY CONSULT NOTE       Patient ID: Willie Steele MRN: [id number] DOB/AGE: [birth year] 41 y.o.  Admit date: (Not on file) Referring Physician: Everlene Other Primary Physician: Tracey Harries, MD Primary Cardiologist: New Reason for Consultation: Diastolic CHF  Active Problems:   * No active hospital problems. *   HPI:  41 y.o. referred by Dr Everlene Other for chronic diastolic CHF History of HTN and morbid obesity He is being Rx with Cozaar and Torsemide He was referred to bariatrics in March but cancelled Has had abscess in right thigh recently Rx with Doxycycline Recorded weight at primary 654 lbs 07/05/21 [facility] 5 day hospitalization  February 2023 for volume overload with no DVT/ normal echo and no PEHis BNP was also normal He has OSA but does not wear CPAP  TTE 05/30/21 poor windows EF 55-65% normal RV poor visualization of valves  ECG 05/29/21 ST rate 104 otherwise normal During hospitalization 2/15-2/20 was hypoxic Seen by pulmonary and weaned of 3 L's oxygen and diuresed with iv lasix D/c with torsemide 20 mg and losartan 100 mg   ***  ROS All other systems reviewed and negative except as noted above  Past Medical History:  Diagnosis Date   Abscess of right thigh    Chronic diastolic (congestive) heart failure (HCC)    Hypertension    Morbid obesity (HCC)     Family History  Problem Relation Age of Onset   Bowel Disease Father        colon blockage    Social History   Socioeconomic History   Marital status: Single    Spouse name: Not on file   Number of children: Not on file   Years of education: Not on file   Highest education level: Not on file  Occupational History   Not on file  Tobacco Use   Smoking status: Former    Types: Cigarettes    Quit date: 04/13/2011    Years since quitting: 10.3   Smokeless tobacco: Never  Substance and Sexual Activity   Alcohol use: Yes    Comment: every few weeks   Drug use: No   Sexual activity: Not on file  Other Topics  Concern   Not on file  Social History Narrative   Not on file   Social Determinants of Health   Financial Resource Strain: Not on file  Food Insecurity: Not on file  Transportation Needs: Not on file  Physical Activity: Not on file  Stress: Not on file  Social Connections: Not on file  Intimate Partner Violence: Not on file    No past surgical history on file.    Current Outpatient Medications:    acetaminophen (TYLENOL) 500 MG tablet, Take 2,000 mg by mouth daily as needed for moderate pain., Disp: , Rfl:    docusate sodium (COLACE) 100 MG capsule, Take 400 mg by mouth daily as needed for moderate constipation., Disp: , Rfl:    losartan (COZAAR) 100 MG tablet, Take 100 mg by mouth daily., Disp: , Rfl:    OVER THE COUNTER MEDICATION, Take 2 tablets by mouth at bedtime. Zquil, Disp: , Rfl:    torsemide (DEMADEX) 20 MG tablet, Take 1 tablet (20 mg total) by mouth daily., Disp: 30 tablet, Rfl: 2    Physical Exam: There were no vitals taken for this visit.   Super morbid obesity Decreased BS base Distant heart sounds LE edema *** Boils and right thigh recent drainage  ***  Labs:   Lab Results  Component Value Date   WBC 9.3 06/03/2021   HGB 14.8 06/03/2021   HCT 48.9 06/03/2021   MCV 96.1 06/03/2021   PLT 239 06/03/2021   No results for input(s): NA, K, CL, CO2, BUN, CREATININE, CALCIUM, PROT, BILITOT, ALKPHOS, ALT, AST, GLUCOSE in the last 168 hours.  Invalid input(s): LABALBU No results found for: CKTOTAL, CKMB, CKMBINDEX, TROPONINI  Lab Results  Component Value Date   CHOL 126 05/30/2021   Lab Results  Component Value Date   HDL 25 (L) 05/30/2021   Lab Results  Component Value Date   LDLCALC 84 05/30/2021   Lab Results  Component Value Date   TRIG 85 05/30/2021   Lab Results  Component Value Date   CHOLHDL 5.0 05/30/2021   No results found for: LDLDIRECT    Radiology: No results found.  EKG: See HPI   ASSESSMENT AND PLAN:   Chronic  diastolic CHF:  Not really the issue hear EF normal on TTE and BNP normal Issue is super morbid obesity BMET has been normal Only on Demedex 20 mg daily  HTN:  continue diuretic and ARB OSA:  needs new CPAP equipment and f/u for compliance related to his hypoxia  Obesity:  has been referred to bariatrics but cancelled appointment No likleyhood of quality of life at this weight No chance of normal volume status in LE;s at this weight and continued risk of intriginous boils and wounds Recent Rx with doxycyline  ***  Signed: Charlton Haws 08/26/2021, 6:55 PM

## 2021-08-28 ENCOUNTER — Ambulatory Visit: Payer: Managed Care, Other (non HMO) | Admitting: Cardiovascular Disease

## 2021-09-11 ENCOUNTER — Ambulatory Visit: Payer: Managed Care, Other (non HMO) | Admitting: Cardiology

## 2021-09-11 NOTE — Progress Notes (Incomplete)
Cardiology Office Note:    Date:  09/11/2021   ID:  Willie Steele, DOB 1980-09-27, MRN [id number]  PCP:  Willie Harries, MD   [facility] Providers Cardiologist:  None { Click to update primary MD,subspecialty MD or APP then REFRESH:1}    Referring MD: Willie Harries, MD    History of Present Illness:    Willie Steele is a 41 y.o. male who presents for evaluation of congestive heart failure at the request of Dr. Everlene Steele.  Referral notes from Dr. Everlene Steele reviewed. At his appointment on 07/05/21 he was taking torsemide daily and had lost 6 lbs. He was referred to cardiology for further evaluation of chronic diastolic heart failure.   Previously hospitalized for 5 days for fluid overload due to chronic diastolic heart failure. Diuresed almost 70 lbs of fluid. Furosemide was changed to torsemide 20 mg daily.  Today: ***  He denies any palpitations, chest pain, shortness of breath, or peripheral edema. No lightheadedness, headaches, syncope, orthopnea, or PND.  (+)  Past Medical History:  Diagnosis Date   Abscess of right thigh    Chronic diastolic (congestive) heart failure (HCC)    Hypertension    Morbid obesity (HCC)     No past surgical history on file.  Current Medications: No outpatient medications have been marked as taking for the 09/11/21 encounter (Appointment) with Jake Bathe, MD.     Allergies:   Sulfa antibiotics   Social History   Socioeconomic History   Marital status: Single    Spouse name: Not on file   Number of children: Not on file   Years of education: Not on file   Highest education level: Not on file  Occupational History   Not on file  Tobacco Use   Smoking status: Former    Types: Cigarettes    Quit date: 04/13/2011    Years since quitting: 10.4   Smokeless tobacco: Never  Substance and Sexual Activity   Alcohol use: Yes    Comment: every few weeks   Drug use: No   Sexual activity: Not on file  Steele Topics Concern   Not on file   Social History Narrative   Not on file   Social Determinants of Health   Financial Resource Strain: Not on file  Food Insecurity: Not on file  Transportation Needs: Not on file  Physical Activity: Not on file  Stress: Not on file  Social Connections: Not on file     Family History: The patient's family history includes Bowel Disease in his father.  ROS:   Please see the history of present illness.     All Steele systems reviewed and are negative.  EKGs/Labs/Steele Studies Reviewed:    The following studies were reviewed today:  Echo  05/30/2021: Sonographer Comments: Suboptimal parasternal window, suboptimal apical  window and patient is morbidly obese. Image acquisition challenging due to  patient body habitus. PATIENT STATES WEIGHT APPROXIMATELY 690 LBS / DUE TO  BODY HABITUS SUBOPTIMAL STUDY.  IMPRESSIONS     1. Nondiagnostic study due to very poor echo windows and visualization of  cardiac structures.   2. Unable to visualize LV endocardium, however, based on very limited  views, LVEF appears to be within normal range 55-65%. Left ventricular  endocardial border not optimally defined to evaluate regional wall motion.   3. Right ventricular systolic function is normal. The right ventricular  size is not well visualized.   4. The mitral valve was not well visualized.   5.  The aortic valve was not well visualized.   6. The inferior vena cava is dilated in size with <50% respiratory  variability, suggesting right atrial pressure of 15 mmHg.   Bilateral LE Venous Doppler  05/30/2021: Summary:  BILATERAL:  -No evidence of popliteal cyst, bilaterally.  RIGHT:  - There is no evidence of deep vein thrombosis in the lower extremity.  However, portions of this examination were limited- see technologist  comments above.     LEFT:  - There is no evidence of deep vein thrombosis in the lower extremity.  However, portions of this examination were limited- see technologist   comments above.    EKG:  EKG is personally reviewed. 09/11/2021: EKG was not ordered. 05/29/2021 (ED): Sinus tachycardia.  Recent Labs: 05/30/2021: B Natriuretic Peptide 42.9 05/31/2021: TSH 1.205 06/03/2021: ALT 47; BUN 23; Creatinine, Ser 1.06; Hemoglobin 14.8; Magnesium 2.3; Platelets 239; Potassium 4.3; Sodium 137   Recent Lipid Panel    Component Value Date/Time   CHOL 126 05/30/2021 0326   TRIG 85 05/30/2021 0326   HDL 25 (L) 05/30/2021 0326   CHOLHDL 5.0 05/30/2021 0326   VLDL 17 05/30/2021 0326   LDLCALC 84 05/30/2021 0326     Risk Assessment/Calculations:   {Does this patient have ATRIAL FIBRILLATION?:[phone]}       Physical Exam:    VS:  There were no vitals taken for this visit.    Wt Readings from Last 3 Encounters:  06/02/21 (!) 678 lb 4.8 oz (307.7 kg)  04/12/13 (!) 490 lb (222.3 kg)  12/21/12 (!) 515 lb (233.6 kg)     GEN: Well nourished, well developed in no acute distress HEENT: Normal NECK: No JVD; No carotid bruits LYMPHATICS: No lymphadenopathy CARDIAC: ***RRR, no murmurs, rubs, gallops RESPIRATORY:  Clear to auscultation without rales, wheezing or rhonchi  ABDOMEN: Soft, non-tender, non-distended MUSCULOSKELETAL:  No edema; No deformity  SKIN: Warm and dry NEUROLOGIC:  Alert and oriented x 3 PSYCHIATRIC:  Normal affect   ASSESSMENT:    No diagnosis found. PLAN:    In order of problems listed above:  No problem-specific Assessment & Plan notes found for this encounter.   ***      {Are you ordering a CV Procedure (e.g. stress test, cath, DCCV, TEE, etc)?   Press F2        :[id number]}  Follow-up:  ***  Medication Adjustments/Labs and Tests Ordered: Current medicines are reviewed at length with the patient today.  Concerns regarding medicines are outlined above.   No orders of the defined types were placed in this encounter.  No orders of the defined types were placed in this encounter.  There are no Patient Instructions on  file for this visit.   I,Mathew Stumpf,acting as a Neurosurgeon for [employer], MD.,have documented all relevant documentation on the behalf of Donato Schultz, MD,as directed by  Donato Schultz, MD while in the presence of Donato Schultz, MD.  ***  Signed, Carlena Bjornstad  09/11/2021 11:24 AM    [facility]

## 2021-09-27 ENCOUNTER — Institutional Professional Consult (permissible substitution): Payer: Managed Care, Other (non HMO) | Admitting: Pulmonary Disease

## 2021-10-08 ENCOUNTER — Institutional Professional Consult (permissible substitution): Payer: Managed Care, Other (non HMO) | Admitting: Pulmonary Disease

## 2021-10-22 ENCOUNTER — Institutional Professional Consult (permissible substitution): Payer: Managed Care, Other (non HMO) | Admitting: Primary Care

## 2021-10-22 NOTE — Progress Notes (Deleted)
 @  Patient ID: Willie Steele, male    DOB: March 13, 1981, 41 y.o.   MRN: [id number]  No chief complaint on file.   Referring provider: Tracey Harries, MD  HPI: 41 year old male, former smoker quit in 2012.  Past medical history significant for hypertension, diastolic congestive heart failure, cardiomegaly, OSA on CPAP, acute respiratory failure with hypoxia, morbid obesity.  10/22/2021 Patient presents today for sleep consult.          Allergies  Allergen Reactions   Sulfa Antibiotics Hives and Rash     There is no immunization history on file for this patient.  Past Medical History:  Diagnosis Date   Abscess of right thigh    Chronic diastolic (congestive) heart failure (HCC)    Hypertension    Morbid obesity (HCC)     Tobacco History: Social History   Tobacco Use  Smoking Status Former   Types: Cigarettes   Quit date: 04/13/2011   Years since quitting: 10.5  Smokeless Tobacco Never   Counseling given: Not Answered   Outpatient Medications Prior to Visit  Medication Sig Dispense Refill   acetaminophen (TYLENOL) 500 MG tablet Take 2,000 mg by mouth daily as needed for moderate pain.     docusate sodium (COLACE) 100 MG capsule Take 400 mg by mouth daily as needed for moderate constipation.     losartan (COZAAR) 100 MG tablet Take 100 mg by mouth daily.     OVER THE COUNTER MEDICATION Take 2 tablets by mouth at bedtime. Zquil     torsemide (DEMADEX) 20 MG tablet Take 1 tablet (20 mg total) by mouth daily. 30 tablet 2   No facility-administered medications prior to visit.      Review of Systems  Review of Systems   Physical Exam  There were no vitals taken for this visit. Physical Exam   Lab Results:  CBC    Component Value Date/Time   WBC 9.3 06/03/2021 0433   RBC 5.09 06/03/2021 0433   HGB 14.8 06/03/2021 0433   HCT 48.9 06/03/2021 0433   PLT 239 06/03/2021 0433   MCV 96.1 06/03/2021 0433   MCH 29.1 06/03/2021 0433   MCHC 30.3 06/03/2021  0433   RDW 15.9 (H) 06/03/2021 0433   LYMPHSABS 1.3 05/29/2021 1526   MONOABS 0.5 05/29/2021 1526   EOSABS 0.1 05/29/2021 1526   BASOSABS 0.0 05/29/2021 1526    BMET    Component Value Date/Time   NA 137 06/03/2021 0433   K 4.3 06/03/2021 0433   CL 90 (L) 06/03/2021 0433   CO2 39 (H) 06/03/2021 0433   GLUCOSE 123 (H) 06/03/2021 0433   BUN 23 (H) 06/03/2021 0433   CREATININE 1.06 06/03/2021 0433   CALCIUM 9.1 06/03/2021 0433   GFRNONAA >60 06/03/2021 0433    BNP    Component Value Date/Time   BNP 42.9 05/30/2021 0326    ProBNP No results found for: "PROBNP"  Imaging: No results found.   Assessment & Plan:   No problem-specific Assessment & Plan notes found for this encounter.     Glenford Bayley, NP 10/22/2021

## 2022-05-21 ENCOUNTER — Encounter: Payer: Self-pay | Admitting: Orthopaedic Surgery

## 2022-05-21 ENCOUNTER — Ambulatory Visit (INDEPENDENT_AMBULATORY_CARE_PROVIDER_SITE_OTHER): Payer: Managed Care, Other (non HMO)

## 2022-05-21 ENCOUNTER — Ambulatory Visit: Payer: Managed Care, Other (non HMO) | Admitting: Orthopaedic Surgery

## 2022-05-21 VITALS — Ht 74.0 in | Wt >= 6400 oz

## 2022-05-21 DIAGNOSIS — G8929 Other chronic pain: Secondary | ICD-10-CM

## 2022-05-21 DIAGNOSIS — M25561 Pain in right knee: Secondary | ICD-10-CM | POA: Diagnosis not present

## 2022-05-21 MED ORDER — MELOXICAM 15 MG PO TABS: 15.0000 mg | ORAL_TABLET | Freq: Every day | ORAL | 0 refills | Status: DC | PRN

## 2022-05-21 NOTE — Progress Notes (Signed)
Willie Steele comes in today for evaluation treatment of chronic knee issues with his right knee.  He has actually seen one of my colleagues in town who did provide a steroid injection in his right knee just a week ago.  He says that is helped just a little bit.  He takes Tylenol arthritis.  EM is with a cane.  He is only 42 years old.  He is not a diabetic.  He does have to take Lasix on a daily basis for fluid retention.  He reports a weight of 662 pounds with a BMI that is calculated to 85.  On exam his right knee does have some varus malalignment.  It is difficult to tell if there is an effusion due to a large soft tissue envelope.  There is significant venous stasis changes in both legs and lymphedema as well.  An AP and lateral of his right knee does show tricompartment arthritis.  There is still medial and lateral compartment space remaining but seeing osteophytes in all 3 compartments and significant patellofemoral 3 changes, the arthritis in the medial lateral aspect is actually probably much worse than what it shows on plain film.  He is ambulate with a cane but I suggest that he use this in his office at left hand to take pressure off the right knee.  He may be a good candidate for hyaluronic acid in the future.  I gave him some handouts about this.  We should see him back in 2 weeks to see how he is doing in terms of his response to steroid.  I will also start him on meloxicam as a daily anti-inflammatory.  All questions and concerns were answered addressed.  He agrees with this treatment plan.

## 2022-06-09 ENCOUNTER — Ambulatory Visit: Payer: Managed Care, Other (non HMO) | Admitting: Orthopaedic Surgery

## 2022-06-09 DIAGNOSIS — M1711 Unilateral primary osteoarthritis, right knee: Secondary | ICD-10-CM

## 2022-06-09 DIAGNOSIS — M25561 Pain in right knee: Secondary | ICD-10-CM

## 2022-06-09 DIAGNOSIS — G8929 Other chronic pain: Secondary | ICD-10-CM | POA: Diagnosis not present

## 2022-06-09 NOTE — Progress Notes (Signed)
The patient comes in today with continued pain as a relates to his right knee and the known arthritis of his right knee.  At this point he has tried and failed every conservative treatment for the right knee other than hyaluronic acid.  His x-rays show tricompartment arthritis.  He does ambulate with a cane in his opposite hand.  Although he weighs over 600 pounds he has lost weight.  He has worked on activity modification and has had a steroid injection in his right knee that did help some but does not help enough.  He is on meloxicam as well and has been somewhat helpful.  His right knee has arthritic changes with varus malalignment.  There is patellofemoral crepitation as well.  He has good days and bad days.  At this point he is a perfect candidate for hyaluronic acid for his right knee to treat the pain from osteoarthritis.  We will work on getting this hopefully ordered and approved for him.  He also agrees with this treatment plan.  This patient is diagnosed with osteoarthritis of the knee(s).    Radiographs show evidence of joint space narrowing, osteophytes, subchondral sclerosis and/or subchondral cysts.  This patient has knee pain which interferes with functional and activities of daily living.    This patient has experienced inadequate response, adverse effects and/or intolerance with conservative treatments such as acetaminophen, NSAIDS, topical creams, physical therapy or regular exercise, knee bracing and/or weight loss.   This patient has experienced inadequate response or has a contraindication to intra articular steroid injections for at least 3 months.   This patient is not scheduled to have a total knee replacement within 6 months of starting treatment with viscosupplementation.

## 2022-06-17 ENCOUNTER — Telehealth: Payer: Self-pay | Admitting: Orthopaedic Surgery

## 2022-06-17 NOTE — Telephone Encounter (Signed)
VOB submitted for Euflexxa, right knee

## 2022-06-17 NOTE — Telephone Encounter (Signed)
Patient called. He would like gel injections for his knee. His call back number is [phone]

## 2022-06-18 ENCOUNTER — Encounter: Payer: Self-pay | Admitting: Orthopaedic Surgery

## 2022-06-18 ENCOUNTER — Other Ambulatory Visit: Payer: Self-pay | Admitting: Orthopaedic Surgery

## 2022-06-20 ENCOUNTER — Telehealth: Payer: Self-pay

## 2022-06-20 NOTE — Telephone Encounter (Signed)
Faxed completed PA form for Euflexxa, right knee to [employer] at [phone]. PA Pending

## 2022-07-01 ENCOUNTER — Other Ambulatory Visit: Payer: Self-pay

## 2022-07-01 DIAGNOSIS — M1711 Unilateral primary osteoarthritis, right knee: Secondary | ICD-10-CM

## 2022-07-23 ENCOUNTER — Encounter: Payer: Self-pay | Admitting: Orthopaedic Surgery

## 2022-07-23 ENCOUNTER — Ambulatory Visit: Payer: Managed Care, Other (non HMO) | Admitting: Orthopaedic Surgery

## 2022-07-23 DIAGNOSIS — M1711 Unilateral primary osteoarthritis, right knee: Secondary | ICD-10-CM

## 2022-07-23 DIAGNOSIS — G8929 Other chronic pain: Secondary | ICD-10-CM

## 2022-07-23 DIAGNOSIS — M25561 Pain in right knee: Secondary | ICD-10-CM

## 2022-07-23 MED ORDER — SODIUM HYALURONATE (VISCOSUP) 20 MG/2ML IX SOSY
20.0000 mg | PREFILLED_SYRINGE | INTRA_ARTICULAR | Status: AC | PRN
  Administered 2022-07-23: 20 mg via INTRA_ARTICULAR

## 2022-07-23 NOTE — Progress Notes (Signed)
   Procedure Note  Patient: Willie Steele             Date of Birth: 25-Dec-1980           MRN: [id number]             Visit Date: 07/23/2022  Procedures: Visit Diagnoses:  1. Unilateral primary osteoarthritis, right knee   2. Chronic pain of right knee     Large Joint Inj: R knee on 07/23/2022 4:19 PM Indications: diagnostic evaluation Details: 18 G 1.5 in needle, superolateral approach  Arthrogram: No  Medications: 20 mg Sodium Hyaluronate (Viscosup) 20 MG/2ML Outcome: tolerated well, no immediate complications Procedure, treatment alternatives, risks and benefits explained, specific risks discussed. Consent was given by the patient. Immediately prior to procedure a time out was called to verify the correct patient, procedure, equipment, support staff and site/side marked as required. Patient was prepped and draped in the usual sterile fashion.    The patient comes in today for #1 of a series of 3 Euflexxa injections in his right knee to treat the pain from osteoarthritis.  He understands fully why he is having this type of injection.  He has significant arthritis in his right knee.  He said no acute changes in medical status.  I did place injection #1 in his right knee without difficulty.  Will see him back next week for injection #2 of a series of 3 injections.  Lot number [id number] Expiration: 07/12/2023

## 2022-07-24 ENCOUNTER — Other Ambulatory Visit: Payer: Self-pay | Admitting: Orthopaedic Surgery

## 2022-07-30 ENCOUNTER — Ambulatory Visit: Payer: Managed Care, Other (non HMO) | Admitting: Orthopaedic Surgery

## 2022-07-30 ENCOUNTER — Encounter: Payer: Self-pay | Admitting: Orthopaedic Surgery

## 2022-07-30 DIAGNOSIS — M1711 Unilateral primary osteoarthritis, right knee: Secondary | ICD-10-CM

## 2022-07-30 MED ORDER — SODIUM HYALURONATE (VISCOSUP) 20 MG/2ML IX SOSY
20.0000 mg | PREFILLED_SYRINGE | INTRA_ARTICULAR | Status: AC | PRN
  Administered 2022-07-30: 20 mg via INTRA_ARTICULAR

## 2022-07-30 NOTE — Progress Notes (Signed)
   Procedure Note  Patient: Pavlos Yon             Date of Birth: 1981-03-11           MRN: [id number]             Visit Date: 07/30/2022  Procedures: Visit Diagnoses: No diagnosis found.  Large Joint Inj: R knee on 07/30/2022 11:01 AM Indications: diagnostic evaluation and pain Details: 22 G 1.5 in needle, superolateral approach  Arthrogram: No  Medications: 20 mg Sodium Hyaluronate (Viscosup) 20 MG/2ML Outcome: tolerated well, no immediate complications Procedure, treatment alternatives, risks and benefits explained, specific risks discussed. Consent was given by the patient. Immediately prior to procedure a time out was called to verify the correct patient, procedure, equipment, support staff and site/side marked as required. Patient was prepped and draped in the usual sterile fashion.    [place] is here for injection #2 of a series of 3 hyaluronic acid injections with Euflexxa in his right knee to treat the pain from osteoarthritis.  He had no adverse effects to injection #1.  I did place injection #2 of Euflexxa in his right knee today.  Will see him back next week for injection #3.  Lot number: [id number] Expiration date: 07/12/2023

## 2022-08-06 ENCOUNTER — Ambulatory Visit: Payer: Managed Care, Other (non HMO) | Admitting: Physician Assistant

## 2022-08-06 ENCOUNTER — Encounter: Payer: Self-pay | Admitting: Physician Assistant

## 2022-08-06 DIAGNOSIS — M1711 Unilateral primary osteoarthritis, right knee: Secondary | ICD-10-CM

## 2022-08-06 MED ORDER — SODIUM HYALURONATE (VISCOSUP) 20 MG/2ML IX SOSY
20.0000 mg | PREFILLED_SYRINGE | INTRA_ARTICULAR | Status: AC | PRN
Start: 2022-08-06 — End: 2022-08-06
  Administered 2022-08-06: 20 mg via INTRA_ARTICULAR

## 2022-08-06 NOTE — Progress Notes (Signed)
HPI: Willie Steele returns today for his third Euflexxa injection right knee.  He states that he feels that the injection has caused him to have less pain and feels overall he has some improvement.  He still has a lot of stiffness in the knee.  Has had no adverse reaction to the injections.    Procedure Note  Patient: Willie Steele             Date of Birth: 07/17/1980           MRN: [id number]             Visit Date: 08/06/2022  Procedures: Visit Diagnoses:  1. Unilateral primary osteoarthritis, right knee     Large Joint Inj: R knee on 08/06/2022 12:32 PM Indications: pain Details: 22 G 1.5 in needle, anterolateral approach  Arthrogram: No  Medications: 20 mg Sodium Hyaluronate (Viscosup) 20 MG/2ML Outcome: tolerated well, no immediate complications Procedure, treatment alternatives, risks and benefits explained, specific risks discussed. Consent was given by the patient. Immediately prior to procedure a time out was called to verify the correct patient, procedure, equipment, support staff and site/side marked as required. Patient was prepped and draped in the usual sterile fashion.    Plan: Follow-up with [place] as needed.  Tolerated the injection well today.  He understands to wait at least 6 months between supplemental injections.

## 2022-08-25 ENCOUNTER — Other Ambulatory Visit: Payer: Self-pay | Admitting: Orthopaedic Surgery

## 2022-10-14 ENCOUNTER — Other Ambulatory Visit: Payer: Self-pay

## 2022-10-14 MED ORDER — MELOXICAM 15 MG PO TABS: ORAL_TABLET | ORAL | 0 refills | Status: DC

## 2022-12-04 IMAGING — DX DG CHEST 1V
3 series · 3 of 3 positions shown · non-contrast
Comparison: 01/31/2004

CLINICAL DATA: Shortness of breath.  Lower extremity swelling.

EXAM:
CHEST  1 VIEW

[chest ap (1 of 3)]
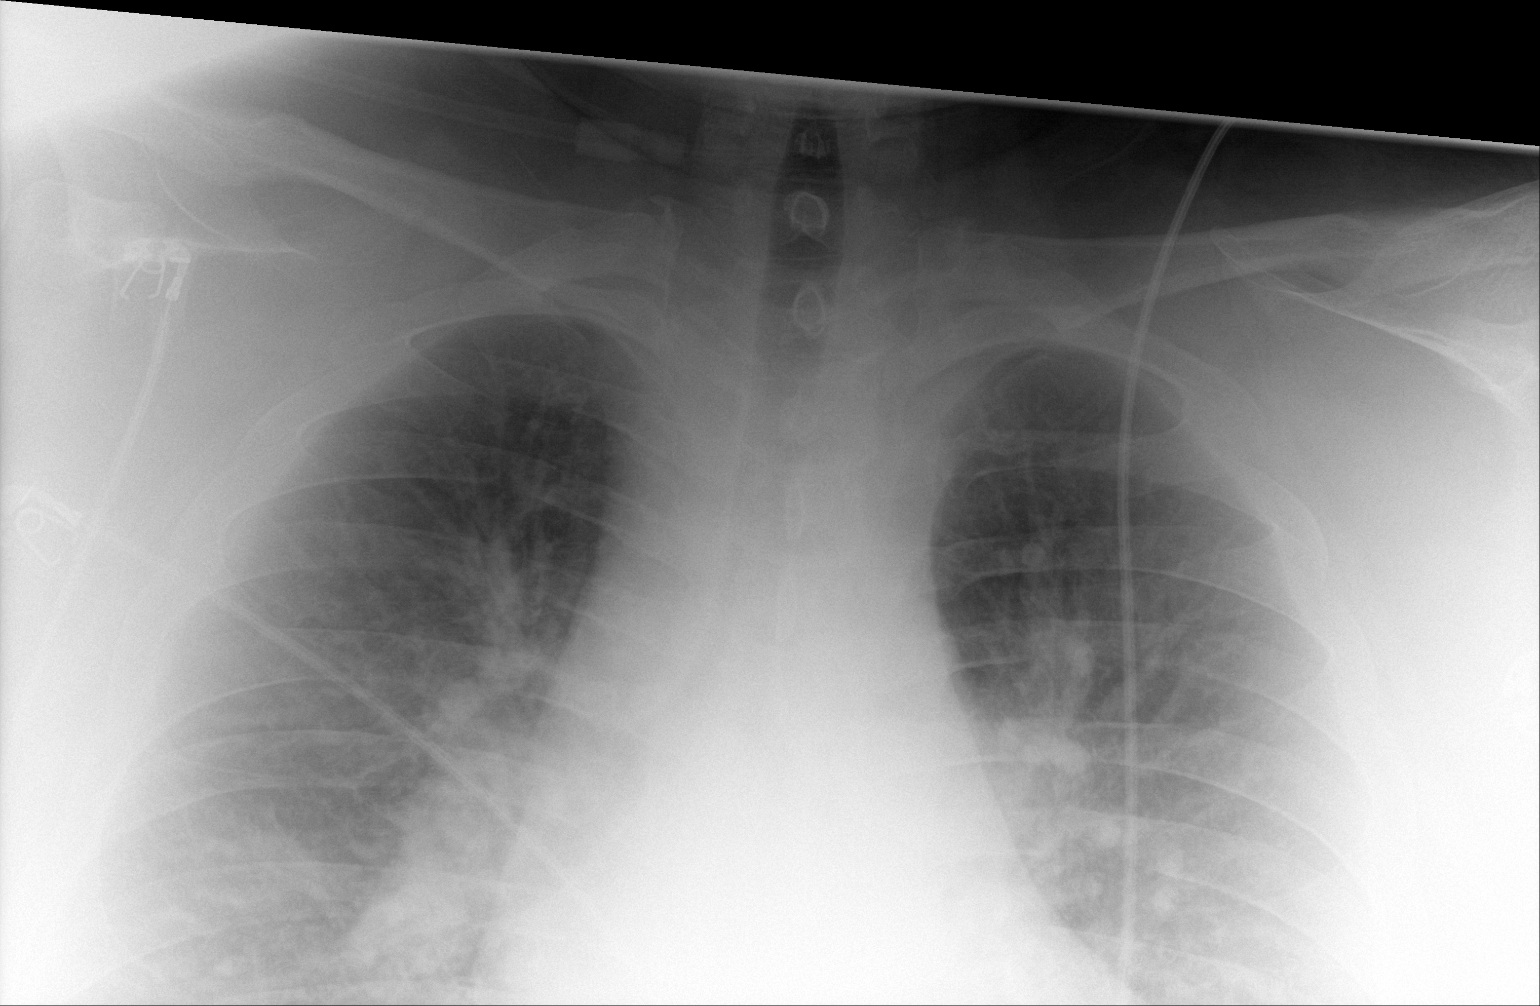

[chest ap (2 of 3)]
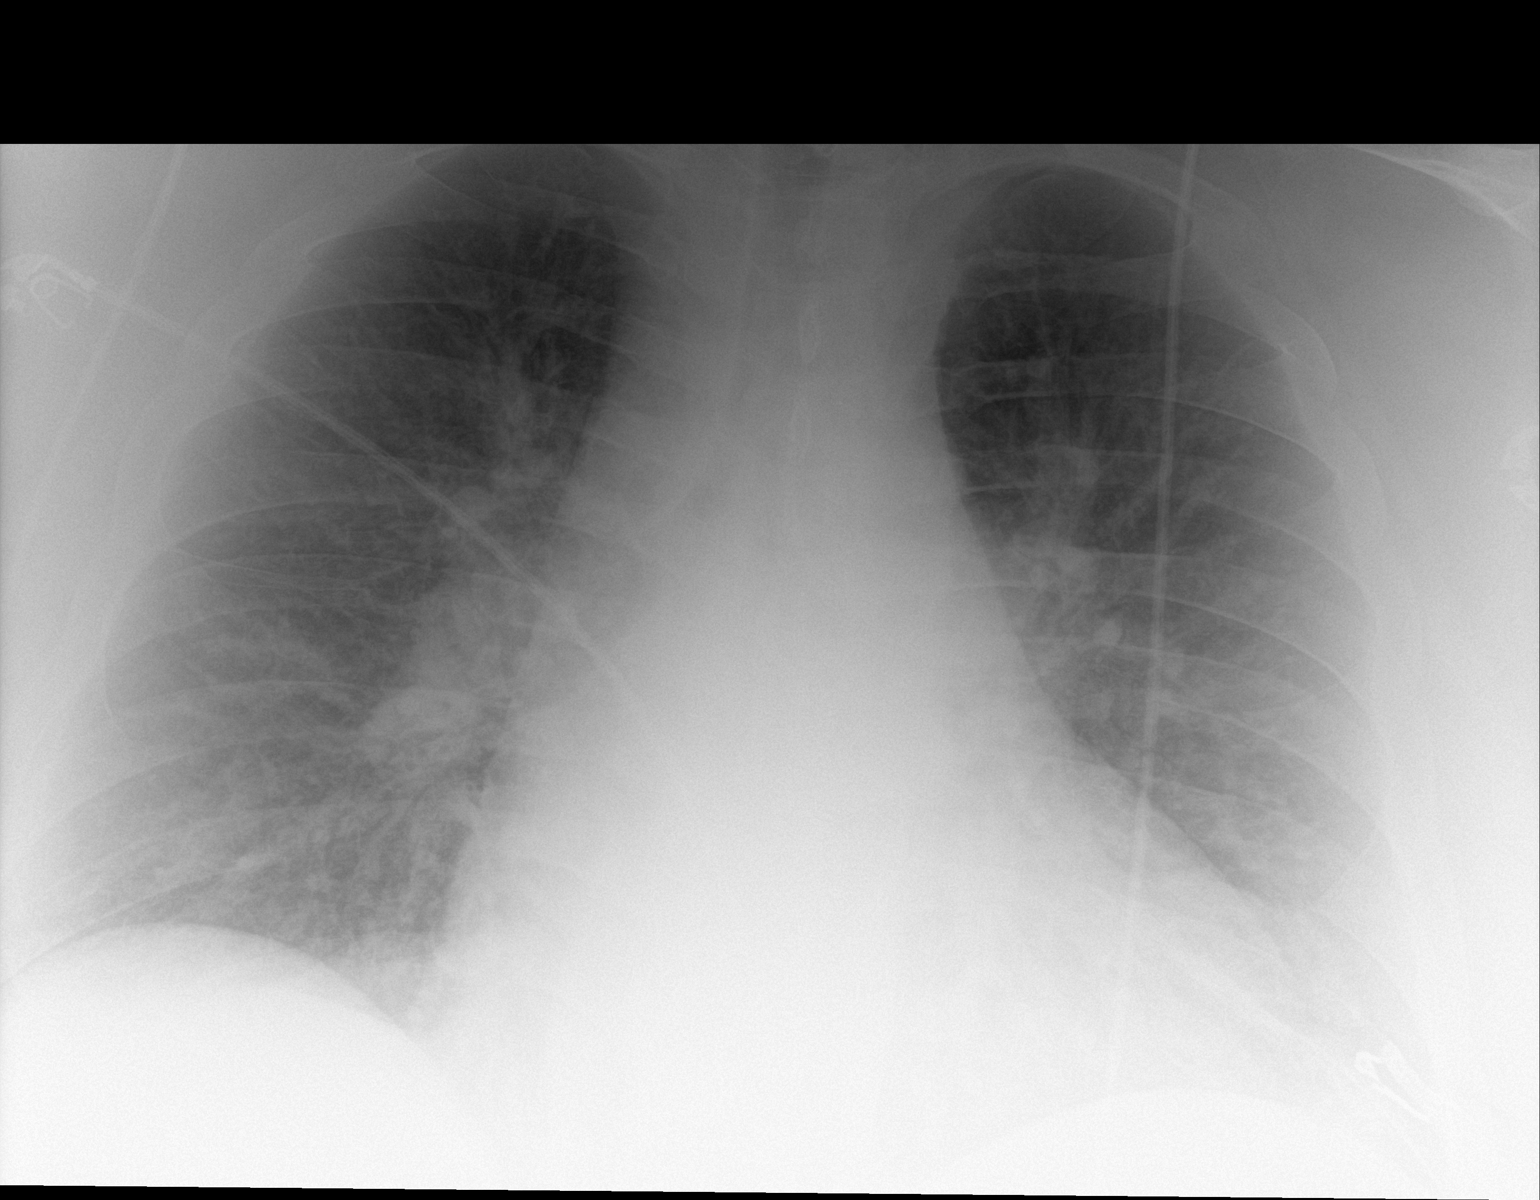

[chest ap (3 of 3)]
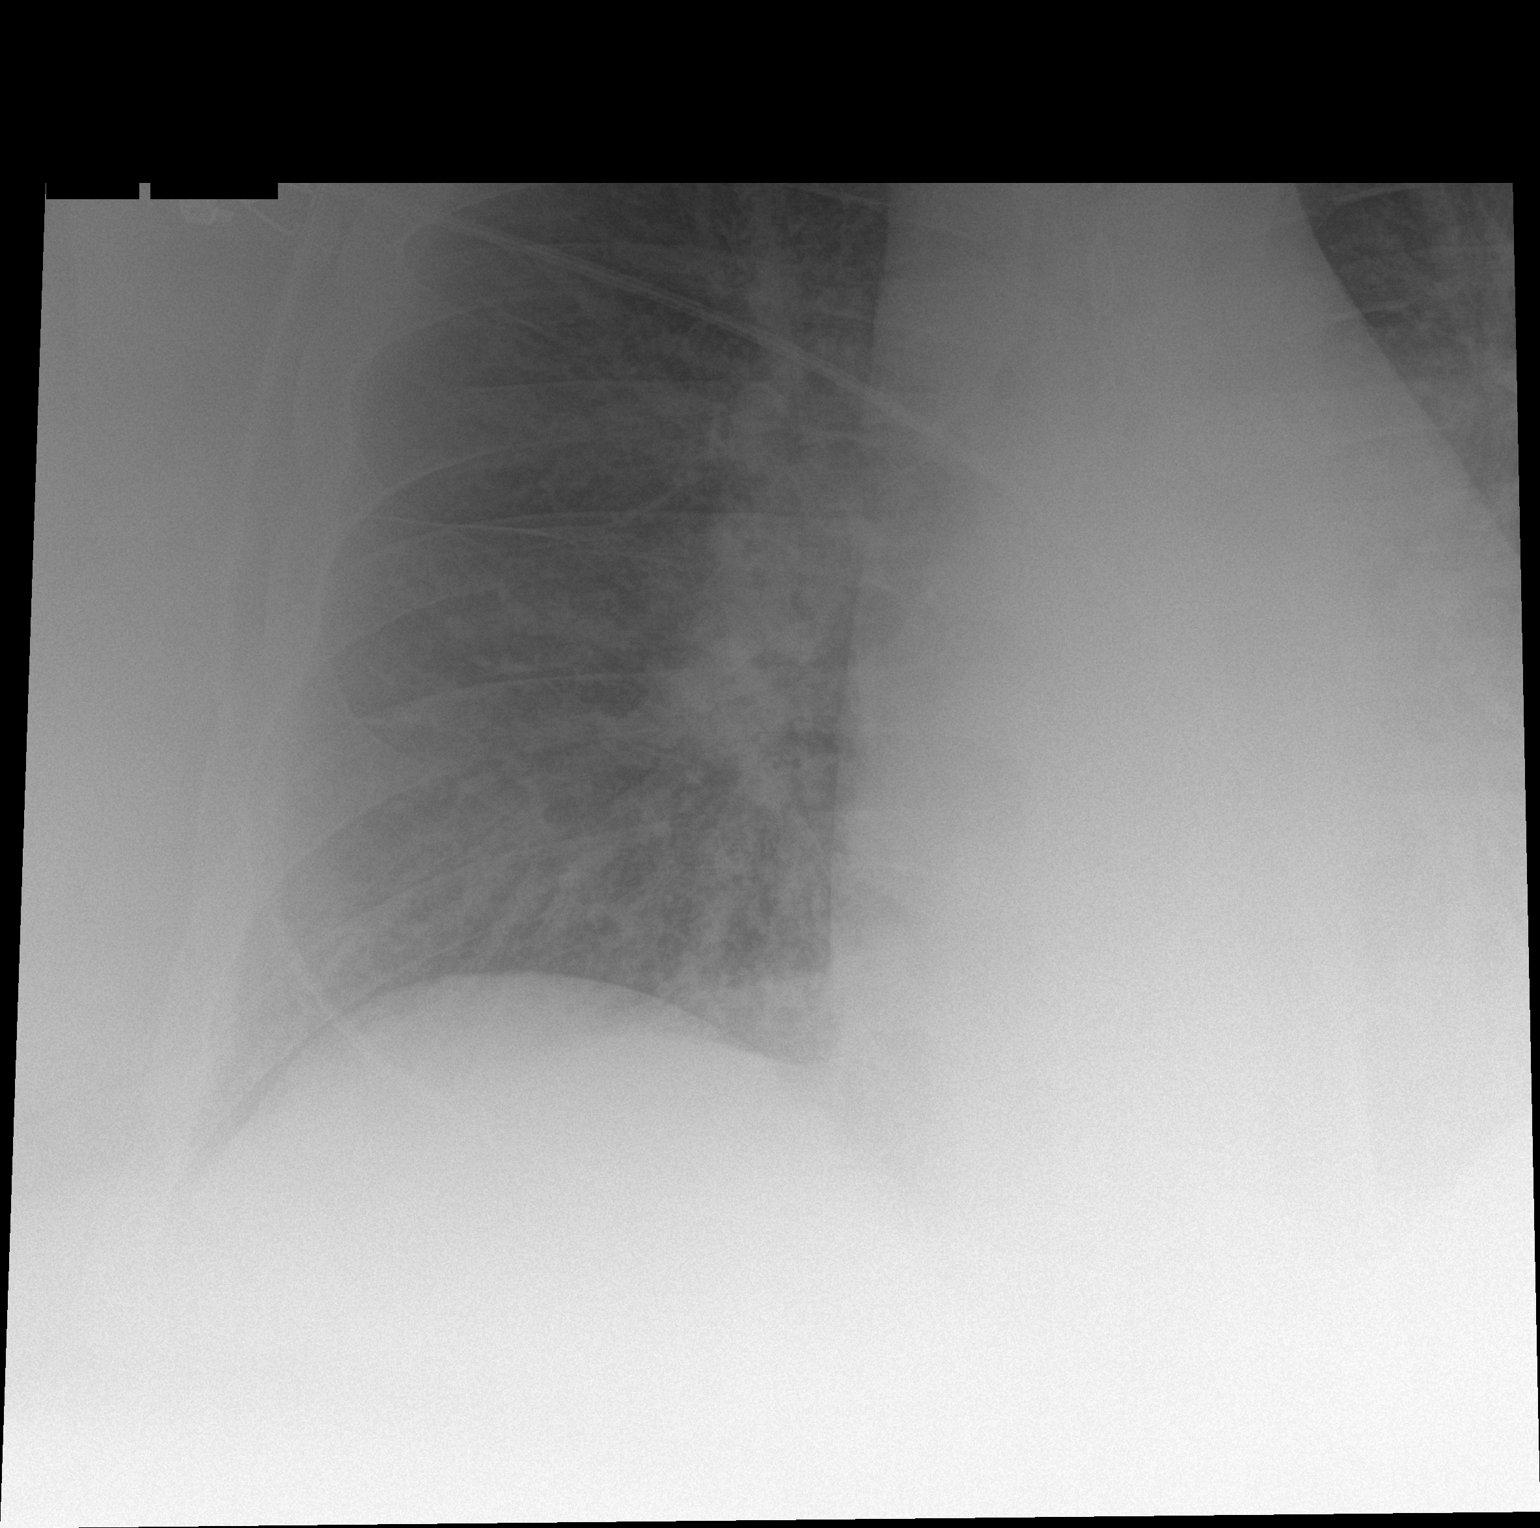

[3 of 3 positions shown; findings below may reference images not displayed]

FINDINGS: The cardiac silhouette is mildly enlarged. There is pulmonary
vascular congestion with diffusely increased interstitial markings
in both lungs. No focal airspace consolidation, large pleural
effusion, or pneumothorax is identified.
IMPRESSION: Cardiomegaly and bilateral interstitial densities, likely edema

## 2022-12-22 ENCOUNTER — Other Ambulatory Visit: Payer: Self-pay | Admitting: Physician Assistant

## 2023-01-28 ENCOUNTER — Ambulatory Visit: Payer: Managed Care, Other (non HMO) | Admitting: Orthopaedic Surgery

## 2023-01-30 ENCOUNTER — Telehealth: Payer: Self-pay | Admitting: Orthopaedic Surgery

## 2023-01-30 NOTE — Telephone Encounter (Signed)
Called patient left message to return call concerning the appointment he reschedule

## 2023-02-03 ENCOUNTER — Other Ambulatory Visit: Payer: Self-pay | Admitting: Physician Assistant

## 2023-02-09 ENCOUNTER — Ambulatory Visit: Payer: Managed Care, Other (non HMO) | Admitting: Orthopaedic Surgery

## 2023-02-12 ENCOUNTER — Ambulatory Visit: Payer: Managed Care, Other (non HMO) | Admitting: Orthopaedic Surgery

## 2023-02-18 ENCOUNTER — Ambulatory Visit: Payer: Managed Care, Other (non HMO) | Admitting: Orthopaedic Surgery

## 2023-03-04 ENCOUNTER — Ambulatory Visit: Payer: Managed Care, Other (non HMO) | Admitting: Orthopaedic Surgery

## 2023-03-04 ENCOUNTER — Inpatient Hospital Stay (HOSPITAL_COMMUNITY)
Admission: EM | Admit: 2023-03-04 | Discharge: 2023-03-10 | DRG: 871 | Disposition: A | Discharge status: Home / Self Care | Payer: Managed Care, Other (non HMO) | Attending: Internal Medicine | Admitting: Internal Medicine

## 2023-03-04 ENCOUNTER — Other Ambulatory Visit: Payer: Self-pay

## 2023-03-04 ENCOUNTER — Emergency Department (HOSPITAL_COMMUNITY): Payer: Managed Care, Other (non HMO)

## 2023-03-04 ENCOUNTER — Encounter (HOSPITAL_COMMUNITY): Payer: Self-pay

## 2023-03-04 DIAGNOSIS — Z1152 Encounter for screening for COVID-19: Secondary | ICD-10-CM

## 2023-03-04 DIAGNOSIS — R6520 Severe sepsis without septic shock: Secondary | ICD-10-CM | POA: Diagnosis present

## 2023-03-04 DIAGNOSIS — I110 Hypertensive heart disease with heart failure: Secondary | ICD-10-CM | POA: Diagnosis present

## 2023-03-04 DIAGNOSIS — A419 Sepsis, unspecified organism: Secondary | ICD-10-CM | POA: Diagnosis present

## 2023-03-04 DIAGNOSIS — I5031 Acute diastolic (congestive) heart failure: Secondary | ICD-10-CM | POA: Diagnosis not present

## 2023-03-04 DIAGNOSIS — Z791 Long term (current) use of non-steroidal anti-inflammatories (NSAID): Secondary | ICD-10-CM

## 2023-03-04 DIAGNOSIS — L03116 Cellulitis of left lower limb: Principal | ICD-10-CM | POA: Diagnosis present

## 2023-03-04 DIAGNOSIS — I10 Essential (primary) hypertension: Secondary | ICD-10-CM | POA: Diagnosis not present

## 2023-03-04 DIAGNOSIS — I509 Heart failure, unspecified: Secondary | ICD-10-CM

## 2023-03-04 DIAGNOSIS — J9601 Acute respiratory failure with hypoxia: Secondary | ICD-10-CM | POA: Diagnosis present

## 2023-03-04 DIAGNOSIS — E876 Hypokalemia: Secondary | ICD-10-CM | POA: Diagnosis present

## 2023-03-04 DIAGNOSIS — Z882 Allergy status to sulfonamides status: Secondary | ICD-10-CM

## 2023-03-04 DIAGNOSIS — T502X5A Adverse effect of carbonic-anhydrase inhibitors, benzothiadiazides and other diuretics, initial encounter: Secondary | ICD-10-CM | POA: Diagnosis not present

## 2023-03-04 DIAGNOSIS — M7989 Other specified soft tissue disorders: Secondary | ICD-10-CM | POA: Diagnosis not present

## 2023-03-04 DIAGNOSIS — L0390 Cellulitis, unspecified: Secondary | ICD-10-CM | POA: Diagnosis not present

## 2023-03-04 DIAGNOSIS — G4733 Obstructive sleep apnea (adult) (pediatric): Secondary | ICD-10-CM | POA: Diagnosis not present

## 2023-03-04 DIAGNOSIS — I878 Other specified disorders of veins: Secondary | ICD-10-CM | POA: Diagnosis present

## 2023-03-04 DIAGNOSIS — E66813 Obesity, class 3: Secondary | ICD-10-CM | POA: Diagnosis present

## 2023-03-04 DIAGNOSIS — Z6841 Body Mass Index (BMI) 40.0 and over, adult: Secondary | ICD-10-CM

## 2023-03-04 DIAGNOSIS — E662 Morbid (severe) obesity with alveolar hypoventilation: Secondary | ICD-10-CM | POA: Diagnosis present

## 2023-03-04 DIAGNOSIS — Z79899 Other long term (current) drug therapy: Secondary | ICD-10-CM | POA: Diagnosis not present

## 2023-03-04 DIAGNOSIS — Z87891 Personal history of nicotine dependence: Secondary | ICD-10-CM

## 2023-03-04 DIAGNOSIS — R0902 Hypoxemia: Secondary | ICD-10-CM

## 2023-03-04 DIAGNOSIS — E669 Obesity, unspecified: Secondary | ICD-10-CM | POA: Diagnosis present

## 2023-03-04 DIAGNOSIS — I5033 Acute on chronic diastolic (congestive) heart failure: Secondary | ICD-10-CM | POA: Diagnosis present

## 2023-03-04 LAB — CBC WITH DIFFERENTIAL/PLATELET
Abs Immature Granulocytes: 0.08 10*3/uL — ABNORMAL HIGH (ref 0.00–0.07)
Basophils Absolute: 0 10*3/uL (ref 0.0–0.1)
Basophils Relative: 0 %
Eosinophils Absolute: 0 10*3/uL (ref 0.0–0.5)
Eosinophils Relative: 0 %
HCT: 46.6 % (ref 39.0–52.0)
Hemoglobin: 14.2 g/dL (ref 13.0–17.0)
Immature Granulocytes: 1 %
Lymphocytes Relative: 5 %
Lymphs Abs: 0.8 10*3/uL (ref 0.7–4.0)
MCH: 30.6 pg (ref 26.0–34.0)
MCHC: 30.5 g/dL (ref 30.0–36.0)
MCV: 100.4 fL — ABNORMAL HIGH (ref 80.0–100.0)
Monocytes Absolute: 0.6 10*3/uL (ref 0.1–1.0)
Monocytes Relative: 3 %
Neutro Abs: 15.9 10*3/uL — ABNORMAL HIGH (ref 1.7–7.7)
Neutrophils Relative %: 91 %
Platelets: 309 10*3/uL (ref 150–400)
RBC: 4.64 MIL/uL (ref 4.22–5.81)
RDW: 16.5 % — ABNORMAL HIGH (ref 11.5–15.5)
WBC: 17.5 10*3/uL — ABNORMAL HIGH (ref 4.0–10.5)
nRBC: 0 % (ref 0.0–0.2)

## 2023-03-04 LAB — COMPREHENSIVE METABOLIC PANEL
ALT: 38 U/L (ref 0–44)
AST: 26 U/L (ref 15–41)
Albumin: 3.4 g/dL — ABNORMAL LOW (ref 3.5–5.0)
Alkaline Phosphatase: 73 U/L (ref 38–126)
Anion gap: 9 (ref 5–15)
BUN: 16 mg/dL (ref 6–20)
CO2: 33 mmol/L — ABNORMAL HIGH (ref 22–32)
Calcium: 9 mg/dL (ref 8.9–10.3)
Chloride: 97 mmol/L — ABNORMAL LOW (ref 98–111)
Creatinine, Ser: 0.82 mg/dL (ref 0.61–1.24)
GFR, Estimated: >60 mL/min (ref >60)
Glucose, Bld: 119 mg/dL — ABNORMAL HIGH (ref 70–99)
Potassium: 4 mmol/L (ref 3.5–5.1)
Sodium: 139 mmol/L (ref 135–145)
Total Bilirubin: 0.8 mg/dL (ref <1.2)
Total Protein: 7.9 g/dL (ref 6.5–8.1)

## 2023-03-04 LAB — BLOOD GAS, ARTERIAL
Acid-Base Excess: 12.7 mmol/L — ABNORMAL HIGH (ref 0.0–2.0)
Bicarbonate: 37.5 mmol/L — ABNORMAL HIGH (ref 20.0–28.0)
O2 Saturation: 93 %
Patient temperature: 39.4
pCO2 arterial: 52 mm[Hg] — ABNORMAL HIGH (ref 32–48)
pH, Arterial: 7.47 — ABNORMAL HIGH (ref 7.35–7.45)
pO2, Arterial: 72 mm[Hg] — ABNORMAL LOW (ref 83–108)

## 2023-03-04 LAB — MRSA NEXT GEN BY PCR, NASAL: MRSA by PCR Next Gen: NOT DETECTED

## 2023-03-04 LAB — HIV ANTIBODY (ROUTINE TESTING W REFLEX): HIV Screen 4th Generation wRfx: NONREACTIVE

## 2023-03-04 LAB — SARS CORONAVIRUS 2 BY RT PCR: SARS Coronavirus 2 by RT PCR: NEGATIVE

## 2023-03-04 LAB — I-STAT CG4 LACTIC ACID, ED: Lactic Acid, Venous: 1.6 mmol/L (ref 0.5–1.9)

## 2023-03-04 LAB — BRAIN NATRIURETIC PEPTIDE: B Natriuretic Peptide: 95.7 pg/mL (ref 0.0–100.0)

## 2023-03-04 MED ORDER — FUROSEMIDE 10 MG/ML IJ SOLN
60.0000 mg | Freq: Two times a day (BID) | INTRAMUSCULAR | Status: DC
  Administered 2023-03-05 – 2023-03-09 (×9): 60 mg via INTRAVENOUS
  Filled 2023-03-04 (×10): qty 6

## 2023-03-04 MED ORDER — ONDANSETRON HCL 4 MG PO TABS: 4.0000 mg | ORAL_TABLET | Freq: Four times a day (QID) | ORAL | Status: DC | PRN

## 2023-03-04 MED ORDER — ORAL CARE MOUTH RINSE: 15.0000 mL | OROMUCOSAL | Status: DC | PRN

## 2023-03-04 MED ORDER — ACETAMINOPHEN 650 MG RE SUPP: 650.0000 mg | Freq: Four times a day (QID) | RECTAL | Status: DC | PRN

## 2023-03-04 MED ORDER — ALBUTEROL SULFATE (2.5 MG/3ML) 0.083% IN NEBU
2.5000 mg | INHALATION_SOLUTION | RESPIRATORY_TRACT | Status: DC | PRN
  Administered 2023-03-04: 2.5 mg via RESPIRATORY_TRACT
  Filled 2023-03-04: qty 3

## 2023-03-04 MED ORDER — FUROSEMIDE 10 MG/ML IJ SOLN
40.0000 mg | Freq: Once | INTRAMUSCULAR | Status: AC
  Administered 2023-03-04: 40 mg via INTRAVENOUS
  Filled 2023-03-04: qty 4

## 2023-03-04 MED ORDER — HYDRALAZINE HCL 20 MG/ML IJ SOLN: 5.0000 mg | Freq: Four times a day (QID) | INTRAMUSCULAR | Status: DC | PRN

## 2023-03-04 MED ORDER — HEPARIN SODIUM (PORCINE) 5000 UNIT/ML IJ SOLN
5000.0000 [IU] | Freq: Three times a day (TID) | INTRAMUSCULAR | Status: DC
  Administered 2023-03-04 – 2023-03-10 (×17): 5000 [IU] via SUBCUTANEOUS
  Filled 2023-03-04 (×17): qty 1

## 2023-03-04 MED ORDER — PIPERACILLIN-TAZOBACTAM 3.375 G IVPB
3.3750 g | Freq: Three times a day (TID) | INTRAVENOUS | Status: DC
  Administered 2023-03-04 – 2023-03-06 (×6): 3.375 g via INTRAVENOUS
  Filled 2023-03-04 (×5): qty 50

## 2023-03-04 MED ORDER — PIPERACILLIN-TAZOBACTAM 3.375 G IVPB 30 MIN
3.3750 g | Freq: Once | INTRAVENOUS | Status: AC
  Administered 2023-03-04: 3.375 g via INTRAVENOUS
  Filled 2023-03-04: qty 50

## 2023-03-04 MED ORDER — CHLORHEXIDINE GLUCONATE CLOTH 2 % EX PADS
6.0000 | MEDICATED_PAD | Freq: Every day | CUTANEOUS | Status: DC
  Administered 2023-03-04 – 2023-03-08 (×5): 6 via TOPICAL

## 2023-03-04 MED ORDER — LINEZOLID 600 MG/300ML IV SOLN
600.0000 mg | Freq: Two times a day (BID) | INTRAVENOUS | Status: DC
  Administered 2023-03-04 – 2023-03-06 (×5): 600 mg via INTRAVENOUS
  Filled 2023-03-04 (×6): qty 300

## 2023-03-04 MED ORDER — ONDANSETRON HCL 4 MG/2ML IJ SOLN: 4.0000 mg | Freq: Four times a day (QID) | INTRAMUSCULAR | Status: DC | PRN

## 2023-03-04 MED ORDER — ACETAMINOPHEN 325 MG PO TABS
650.0000 mg | ORAL_TABLET | Freq: Four times a day (QID) | ORAL | Status: DC | PRN
  Administered 2023-03-04 – 2023-03-05 (×3): 650 mg via ORAL
  Filled 2023-03-04 (×3): qty 2

## 2023-03-04 MED ORDER — LOSARTAN POTASSIUM 50 MG PO TABS
100.0000 mg | ORAL_TABLET | Freq: Every day | ORAL | Status: DC
Start: 2023-03-05 — End: 2023-03-10
  Administered 2023-03-05 – 2023-03-10 (×6): 100 mg via ORAL
  Filled 2023-03-04 (×6): qty 2

## 2023-03-04 MED ORDER — FUROSEMIDE 10 MG/ML IJ SOLN: 60.0000 mg | Freq: Every day | INTRAMUSCULAR | Status: DC

## 2023-03-04 NOTE — Progress Notes (Signed)
Pharmacy Antibiotic Note  Willie Steele is a 42 y.o. male with LLE cellulitis on oral abx PTA who presented to the ED 03/04/2023 with c/o fever, swelling and redness of the LLE.  Pharmacy has been consulted to dose zosyn for infection.  - scr 0.82 (crcl>100)  Plan: - zosyn 3.375 gm IV x1 over 30 min, then 3.375 gm q8h (infuse over 4 hrs) - linezolid 600 mg q12h per MD - With good renal function, pharmacy will sign off for abx consult.  Reconsult [place] if need further assistance.  ________________________________________  Height: 6\' 2"  (188 cm) Weight: (!) 306.2 kg (675 lb) IBW/kg (Calculated) : 82.2  Temp (24hrs), Avg:98 F (36.7 C), Min:98 F (36.7 C), Max:98 F (36.7 C)  Recent Labs  Lab 03/04/23 1135 03/04/23 1142  WBC 17.5*  --   CREATININE 0.82  --   LATICACIDVEN  --  1.6    Estimated Creatinine Clearance: 288.1 mL/min (by C-G formula based on SCr of 0.82 mg/dL).    Allergies  Allergen Reactions   Sulfa Antibiotics Hives and Rash     Thank you for allowing pharmacy to be a part of this patient's care.  Dorna Leitz P 03/04/2023 1:32 PM

## 2023-03-04 NOTE — ED Provider Notes (Signed)
[place] EMERGENCY DEPARTMENT AT [facility] Provider Note   CSN: [id number] Arrival date & time: 03/04/23  1042     History  Chief Complaint  Patient presents with   Leg Pain    Willie Steele is a 42 y.o. male.   Leg Pain Patient presents with likely cellulitis of left lower leg.  Has a history of cellulitis.  States few days ago began to have fevers.  Then talked with his PCP virtually and was given doxycycline.  Was not getting better and then switched to Keflex.  Now reported fever up to 103 at home and feeling more weak and more short of breath.  History of diastolic heart failure and states he feels that he is very volume overloaded.  No cough.  Has not weighed himself at home because he does not have a scale that can weigh him there.    Past Medical History:  Diagnosis Date   Abscess of right thigh    Chronic diastolic (congestive) heart failure (HCC)    Hypertension    Morbid obesity (HCC)     Home Medications Prior to Admission medications   Medication Sig Start Date End Date Taking? Authorizing Provider  acetaminophen (TYLENOL) 500 MG tablet Take 2,000 mg by mouth daily as needed for moderate pain.    [provider]  docusate sodium (COLACE) 100 MG capsule Take 400 mg by mouth daily as needed for moderate constipation.    [provider]  losartan (COZAAR) 100 MG tablet Take 100 mg by mouth daily.    [provider]  meloxicam (MOBIC) 15 MG tablet TAKE 1 TABLET BY MOUTH EVERY DAY AS NEEDED FOR PAIN 02/03/23   Kirtland Bouchard, PA-C  OVER THE COUNTER MEDICATION Take 2 tablets by mouth at bedtime. Zquil    [provider]  torsemide (DEMADEX) 20 MG tablet Take 1 tablet (20 mg total) by mouth daily. 06/03/21   Pokhrel, Rebekah Chesterfield, MD      Allergies    Sulfa antibiotics    Review of Systems   Review of Systems  Physical Exam Updated Vital Signs BP (!) 155/119   Pulse (!) 127   Temp 98 F (36.7 C) (Oral)   Resp  (!) 30   Ht 6\' 2"  (1.88 m)   Wt (!) 306.2 kg   SpO2 97%   BMI 86.66 kg/m  Physical Exam Vitals and nursing note reviewed.  HENT:     Head: Atraumatic.  Cardiovascular:     Rate and Rhythm: Regular rhythm. Tachycardia present.  Pulmonary:     Comments: Some tachypnea.  Difficult examination due to body habitus. Abdominal:     Tenderness: There is no abdominal tenderness.  Musculoskeletal:     Right lower leg: Edema present.     Left lower leg: Edema present.  Skin:    Comments: Chronic edema of pannus and extremities.  Chronic changes of bilateral lower extremities.  Does have more erythema on the left lower leg without clear abscess.  Does have anterior wound.  Neurological:     Mental Status: He is alert and oriented to person, place, and time.     ED Results / Procedures / Treatments   Labs (all labs ordered are listed, but only abnormal results are displayed) Labs Reviewed  COMPREHENSIVE METABOLIC PANEL - Abnormal; Notable for the following components:      Result Value   Chloride 97 (*)    CO2 33 (*)    Glucose, Bld 119 (*)  Albumin 3.4 (*)    All other components within normal limits  CBC WITH DIFFERENTIAL/PLATELET - Abnormal; Notable for the following components:   WBC 17.5 (*)    MCV 100.4 (*)    RDW 16.5 (*)    Neutro Abs 15.9 (*)    Abs Immature Granulocytes 0.08 (*)    All other components within normal limits  CULTURE, BLOOD (ROUTINE X 2)  CULTURE, BLOOD (ROUTINE X 2)  SARS CORONAVIRUS 2 BY RT PCR  BRAIN NATRIURETIC PEPTIDE  I-STAT CG4 LACTIC ACID, ED  I-STAT CG4 LACTIC ACID, ED    EKG EKG Interpretation Date/Time:  Wednesday March 04 2023 11:09:38 EST Ventricular Rate:  122 PR Interval:  188 QRS Duration:  103 QT Interval:  298 QTC Calculation: 425 R Axis:   48  Text Interpretation: Sinus tachycardia Probable left atrial enlargement Low voltage, precordial leads Abnormal inferior Q waves Borderline T wave abnormalities Confirmed by  Benjiman Core [id number]) on 03/04/2023 12:28:07 PM  Radiology No results found.  Procedures Procedures    Medications Ordered in ED Medications  linezolid (ZYVOX) IVPB 600 mg (has no administration in time range)  piperacillin-tazobactam (ZOSYN) IVPB 3.375 g (3.375 g Intravenous New Bag/Given 03/04/23 1228)  furosemide (LASIX) injection 40 mg (has no administration in time range)    ED Course/ Medical Decision Making/ A&P                                 Medical Decision Making Amount and/or Complexity of Data Reviewed Labs: ordered. Radiology: ordered.  Risk Prescription drug management.   Patient with reported fever and likely infection of left lower leg.  Previous cellulitis.  Also tachypneic and hypoxic.  Sats will to go down to 88 without oxygen.  I think likely combination of cellulitis of left leg and CHF.  Will get x-rays of the chest and lower leg.  Will get basic blood work including lactic acid and blood cultures.  Initial lactic acid reassuring.  I think patient is likely severely volume overloaded but cannot weigh himself at home.  Weight is 673 here.  Patient with shortness of breath fever and likely cellulitis of the leg.  No cough.  History of diastolic heart failure.  Last admission had 26 L taken off him.  Weight is somewhat up compared to last visit.  Reviewed previous discharge note.  Lactic acid reassuring but is higher risk with infection.  I think the heart rate could be from infection but also with the volume overload.  Will get x-ray and BNP.  Will likely need diuresis.  Discussed with pharmacist and will now treat with Zyvox instead of vancomycin for MRSA coverage since would be hard for weight-based dosing.  Also now with potential sepsis we will give Zosyn.  White count elevated.  BNP normal.  Chest x-ray independently interpreted and does I think show some volume overload although difficult to tell what body habitus.  Clinically he is  overloaded.  Will give some IV Lasix.  CRITICAL CARE Performed by: Benjiman Core Total critical care time: 30 minutes Critical care time was exclusive of separately billable procedures and treating other patients. Critical care was necessary to treat or prevent imminent or life-threatening deterioration. Critical care was time spent personally by me on the following activities: development of treatment plan with patient and/or surrogate as well as nursing, discussions with consultants, evaluation of patient's response to treatment, examination of patient, obtaining history  from patient or surrogate, ordering and performing treatments and interventions, ordering and review of laboratory studies, ordering and review of radiographic studies, pulse oximetry and re-evaluation of patient's condition.         Final Clinical Impression(s) / ED Diagnoses Final diagnoses:  Cellulitis of left lower extremity  Congestive heart failure, unspecified HF chronicity, unspecified heart failure type [facility])  Hypoxia    Rx / DC Orders ED Discharge Orders     None         Benjiman Core, MD 03/04/23 1257

## 2023-03-04 NOTE — ED Triage Notes (Signed)
Patient brought in by EMS due to possible cellulitis in the left leg. Reports that he is currently receiving treatment for infection in right leg and has weeping wounds to the right leg. Pt reports that his left leg is the one causing issues now. EMS noted patient to have a fever of 103 and did not administer any medications in route. Pt reports feeling like he has excess fluid on him as well. Pt A&O X 4.

## 2023-03-04 NOTE — H&P (Signed)
History and Physical  Willie Steele GMW:[id number] DOB: 05-May-1980 DOA: 03/04/2023  PCP: Tracey Harries, MD   Chief Complaint: Left leg cellulitis  HPI: Willie Steele is a 42 y.o. male with medical history significant for hypertension, super morbid obesity, diastolic congestive heart failure being admitted to the hospital with sepsis due to cellulitis.  States that he started having swelling and redness of the left lower extremity a few days ago, also has been having fevers as high as 103 at home.  He has a standing order for p.o. doxycycline which she started about 10 days ago, after 4 days of taking this without any improvement, his PCP called in p.o. Keflex which he has now been taken for exactly 1 week.  Feeling more weak and shortness of breath with exertion.  He feels like he is volume overloaded, denies cough.  Due to the fevers he started having yesterday, he came to the ER for evaluation.  Here he has been started on empiric IV linezolid, IV Zosyn.  Given IV Lasix, and hospitalist contacted for admission.  Review of Systems: Please see HPI for pertinent positives and negatives. A complete 10 system review of systems are otherwise negative.  Past Medical History:  Diagnosis Date   Abscess of right thigh    Chronic diastolic (congestive) heart failure (HCC)    Hypertension    Morbid obesity (HCC)    History reviewed. No pertinent surgical history.  Social History:  reports that he quit smoking about 11 years ago. His smoking use included cigarettes. He has never used smokeless tobacco. He reports current alcohol use. He reports that he does not use drugs.   Allergies  Allergen Reactions   Sulfa Antibiotics Hives and Rash    Family History  Problem Relation Age of Onset   Bowel Disease Father        colon blockage     Prior to Admission medications   Medication Sig Start Date End Date Taking? Authorizing Provider  acetaminophen (TYLENOL) 500 MG tablet Take 2,000 mg by mouth  daily as needed for moderate pain.    [provider]  docusate sodium (COLACE) 100 MG capsule Take 400 mg by mouth daily as needed for moderate constipation.    [provider]  losartan (COZAAR) 100 MG tablet Take 100 mg by mouth daily.    [provider]  meloxicam (MOBIC) 15 MG tablet TAKE 1 TABLET BY MOUTH EVERY DAY AS NEEDED FOR PAIN 02/03/23   Kirtland Bouchard, PA-C  OVER THE COUNTER MEDICATION Take 2 tablets by mouth at bedtime. Zquil    [provider]  torsemide (DEMADEX) 20 MG tablet Take 1 tablet (20 mg total) by mouth daily. 06/03/21   Pokhrel, Rebekah Chesterfield, MD    Physical Exam: BP (!) 155/119   Pulse (!) 127   Temp 98 F (36.7 C) (Oral)   Resp (!) 30   Ht 6\' 2"  (1.88 m)   Wt (!) 306.2 kg   SpO2 97%   BMI 86.66 kg/m   General:  Alert, oriented, calm, in no acute distress, saturating well on 2 L nasal cannula oxygen, speaking in full sentences, no cough, tachypnea.  He looks nontoxic Cardiovascular: Difficult exam due to body habitus, heart rate is tachycardic. Respiratory: Difficult exam due to body habitus, breath sounds on anterior exam are distant but equal bilaterally without tachypnea, retractions, stridor, or wheezing. Abdomen: soft, nontender, nondistended, normal bowel tones heard  Skin: He has chronic skin thickening, erythema and 3+ pitting  edema in the bilateral lower extremities.  He also has significant 2-3+ pitting edema in the lower abdomen Musculoskeletal: no joint effusions, normal range of motion  Psychiatric: appropriate affect, normal speech  Neurologic: extraocular muscles intact, clear speech, moving all extremities with intact sensorium         Labs on Admission:  Basic Metabolic Panel: Recent Labs  Lab 03/04/23 1135  NA 139  K 4.0  CL 97*  CO2 33*  GLUCOSE 119*  BUN 16  CREATININE 0.82  CALCIUM 9.0   Liver Function Tests: Recent Labs  Lab 03/04/23 1135  AST 26  ALT 38  ALKPHOS 73  BILITOT 0.8  PROT  7.9  ALBUMIN 3.4*   No results for input(s): "LIPASE", "AMYLASE" in the last 168 hours. No results for input(s): "AMMONIA" in the last 168 hours. CBC: Recent Labs  Lab 03/04/23 1135  WBC 17.5*  NEUTROABS 15.9*  HGB 14.2  HCT 46.6  MCV 100.4*  PLT 309   Cardiac Enzymes: No results for input(s): "CKTOTAL", "CKMB", "CKMBINDEX", "TROPONINI" in the last 168 hours.  BNP (last 3 results) Recent Labs    03/04/23 1135  BNP 95.7    ProBNP (last 3 results) No results for input(s): "PROBNP" in the last 8760 hours.  CBG: No results for input(s): "GLUCAP" in the last 168 hours.  Radiological Exams on Admission: No results found.  Assessment/Plan Willie Steele is a 42 y.o. male with medical history significant for hypertension, super morbid obesity, diastolic congestive heart failure being admitted to the hospital with sepsis due to cellulitis.   Sepsis-due to cellulitis, meeting criteria with tachycardia, reported fever, leukocytosis.  He is hemodynamically stable, with normal lactate, and no evidence of endorgan dysfunction. -Inpatient admission to progressive -Empiric linezolid and Zosyn, vancomycin not an option due to his weight  Acute hypoxic respiratory failure-I suspect this is due to a combination of obesity hypoventilation, OSA, and acute on chronic diastolic congestive heart failure.  He has been tachycardic, but I suspect this is due to inflammatory response to his sepsis.  I considered a PE, however the patient has not had any chest pain, denies pleurisy.  Due to body habitus, he cannot have CT angiogram of the chest, or V/Q study.  D-dimer will undoubtedly be elevated in the setting of sepsis and his obesity.  Will not empirically treat for VTE, as he has not been sedentary and has no new risk factors, and has much more likely etiology.  Acute on chronic diastolic congestive heart failure-last echo 05/2021 was essentially nondiagnostic, but based on limited windows he had  preserved EF.  Presumably, he has some diastolic heart failure.  He estimates his dry weight to be about 650 pounds, currently he is at about 675 pounds today.  Will not plan to repeat echo today, as unlikely to be diagnostic or contribute to his acute care. -Sodium restricted diet -Fluid restriction -IV Lasix given in the ER, will schedule 60 mg IV twice daily -Monitor electrolytes with daily labs and replete as indicated  Obesity hypoventilation and obstructive sleep apnea-CPAP nightly  Hypertension-continue Cozaar  Leukocytosis-due to sepsis and suspected cellulitis as above  Morbid obesity-with BMI 87, complicating all aspects of care  DVT prophylaxis: Heparin subcutaneous    Code Status: Full Code  Consults called: None  Admission status: The appropriate patient status for this patient is INPATIENT. Inpatient status is judged to be reasonable and necessary in order to provide the required intensity of service to ensure the patient's safety.  The patient's presenting symptoms, physical exam findings, and initial radiographic and laboratory data in the context of their chronic comorbidities is felt to place them at high risk for further clinical deterioration. Furthermore, it is not anticipated that the patient will be medically stable for discharge from the hospital within 2 midnights of admission.    I certify that at the point of admission it is my clinical judgment that the patient will require inpatient hospital care spanning beyond 2 midnights from the point of admission due to high intensity of service, high risk for further deterioration and high frequency of surveillance required  Time spent: 59 minutes  Penn Grissett Sharlette Dense MD [facility] Pager [phone]  If 7PM-7AM, please contact night-coverage [email or web address] Password [email or web address]  03/04/2023, 1:25 PM

## 2023-03-04 NOTE — Progress Notes (Signed)
   03/04/23 2242  BiPAP/CPAP/SIPAP  $ Non-Invasive Home Ventilator  Initial  $ Face Mask Large  Yes  BiPAP/CPAP/SIPAP Pt Type Adult  BiPAP/CPAP/SIPAP DREAMSTATIOND  Mask Type Full face mask (per home regimen)  Mask Size Large  Respiratory Rate 19 breaths/min  Flow Rate 5 lpm  Patient Home Equipment No  Auto Titrate Yes (automode, min12cm, max25cm per pt comfort / preference)  CPAP/SIPAP surface wiped down Yes  BiPAP/CPAP /SiPAP Vitals  Pulse Rate (!) 113  Resp 19  BP 123/60  SpO2 95 %

## 2023-03-04 NOTE — ED Notes (Signed)
ED TO INPATIENT HANDOFF REPORT  Name/Age/Gender Willie Steele 42 y.o. male  Code Status    Code Status Orders  (From admission, onward)           Start     Ordered   03/04/23 1325  Full code  Continuous       Question:  By:  Answer:  Consent: discussion documented in EHR   03/04/23 1325           Code Status History     Date Active Date Inactive Code Status Order ID Comments User Context   05/29/2021 1725 06/03/2021 1826 Full Code [id number]  Lanae Boast, MD ED       Home/SNF/Other Home  Chief Complaint Sepsis due to cellulitis (HCC) [L03.90, A41.9]  Level of Care/Admitting Diagnosis ED Disposition     ED Disposition  Admit   Condition  --   Comment  Hospital Area: [facility] [[id number]]  Level of Care: Progressive [102]  Admit to Progressive based on following criteria: MULTISYSTEM THREATS such as stable sepsis, metabolic/electrolyte imbalance with or without encephalopathy that is responding to early treatment.  May admit patient to [facility] or [facility] if equivalent level of care is available:: Yes  Covid Evaluation: Asymptomatic - no recent exposure (last 10 days) testing not required  Diagnosis: Sepsis due to cellulitis [facility]) [[id number]]  Admitting Physician: Maryln Gottron [[id number]]  Attending Physician: Kirby Crigler, Parks Neptune [[id number]]  Certification:: I certify this patient will need inpatient services for at least 2 midnights  Expected Medical Readiness: 03/07/2023          Medical History Past Medical History:  Diagnosis Date   Abscess of right thigh    Chronic diastolic (congestive) heart failure (HCC)    Hypertension    Morbid obesity (HCC)     Allergies Allergies  Allergen Reactions   Sulfa Antibiotics Hives and Rash    IV Location/Drains/Wounds Patient Lines/Drains/Airways Status     Active Line/Drains/Airways     Name Placement date Placement time Site Days   Peripheral IV 03/04/23 20 G  Anterior;Distal;Right;Upper Arm 03/04/23  1153  Arm  less than 1            Labs/Imaging Results for orders placed or performed during the hospital encounter of 03/04/23 (from the past 48 hour(s))  Comprehensive metabolic panel     Status: Abnormal   Collection Time: 03/04/23 11:35 AM  Result Value Ref Range   Sodium 139 135 - 145 mmol/L   Potassium 4.0 3.5 - 5.1 mmol/L   Chloride 97 (L) 98 - 111 mmol/L   CO2 33 (H) 22 - 32 mmol/L   Glucose, Bld 119 (H) 70 - 99 mg/dL    Comment: Glucose reference range applies only to samples taken after fasting for at least 8 hours.   BUN 16 6 - 20 mg/dL   Creatinine, Ser 5.78 0.61 - 1.24 mg/dL   Calcium 9.0 8.9 - 46.9 mg/dL   Total Protein 7.9 6.5 - 8.1 g/dL   Albumin 3.4 (L) 3.5 - 5.0 g/dL   AST 26 15 - 41 U/L   ALT 38 0 - 44 U/L   Alkaline Phosphatase 73 38 - 126 U/L   Total Bilirubin 0.8 <1.2 mg/dL   GFR, Estimated >62 >95 mL/min    Comment: (NOTE) Calculated using the CKD-EPI Creatinine Equation (2021)    Anion gap 9 5 - 15    Comment: Performed at [facility], [address]  [place] [place], [place] [address]  CBC with Differential     Status: Abnormal   Collection Time: 03/04/23 11:35 AM  Result Value Ref Range   WBC 17.5 (H) 4.0 - 10.5 K/uL   RBC 4.64 4.22 - 5.81 MIL/uL   Hemoglobin 14.2 13.0 - 17.0 g/dL   HCT 72.5 36.6 - 44.0 %   MCV 100.4 (H) 80.0 - 100.0 fL   MCH 30.6 26.0 - 34.0 pg   MCHC 30.5 30.0 - 36.0 g/dL   RDW 34.7 (H) 42.5 - 95.6 %   Platelets 309 150 - 400 K/uL   nRBC 0.0 0.0 - 0.2 %   Neutrophils Relative % 91 %   Neutro Abs 15.9 (H) 1.7 - 7.7 K/uL   Lymphocytes Relative 5 %   Lymphs Abs 0.8 0.7 - 4.0 K/uL   Monocytes Relative 3 %   Monocytes Absolute 0.6 0.1 - 1.0 K/uL   Eosinophils Relative 0 %   Eosinophils Absolute 0.0 0.0 - 0.5 K/uL   Basophils Relative 0 %   Basophils Absolute 0.0 0.0 - 0.1 K/uL   Immature Granulocytes 1 %   Abs Immature Granulocytes 0.08 (H) 0.00 - 0.07 K/uL     Comment: Performed at [facility], [address] [place], [place] [address]  Brain natriuretic peptide     Status: None   Collection Time: 03/04/23 11:35 AM  Result Value Ref Range   B Natriuretic Peptide 95.7 0.0 - 100.0 pg/mL    Comment: Performed at [facility], [address], [place], [place] [address]  I-Stat Lactic Acid     Status: None   Collection Time: 03/04/23 11:42 AM  Result Value Ref Range   Lactic Acid, Venous 1.6 0.5 - 1.9 mmol/L  SARS Coronavirus 2 by RT PCR (hospital order, performed in [facility] hospital lab) *cepheid single result test* Anterior Nasal Swab     Status: None   Collection Time: 03/04/23 12:32 PM   Specimen: Anterior Nasal Swab  Result Value Ref Range   SARS Coronavirus 2 by RT PCR NEGATIVE NEGATIVE    Comment: (NOTE) SARS-CoV-2 target nucleic acids are NOT DETECTED.  The SARS-CoV-2 RNA is generally detectable in upper and lower respiratory specimens during the acute phase of infection. The lowest concentration of SARS-CoV-2 viral copies this assay can detect is 250 copies / mL. A negative result does not preclude SARS-CoV-2 infection and should not be used as the sole basis for treatment or other patient management decisions.  A negative result may occur with improper specimen collection / handling, submission of specimen other than nasopharyngeal swab, presence of viral mutation(s) within the areas targeted by this assay, and inadequate number of viral copies (<250 copies / mL). A negative result must be combined with clinical observations, patient history, and epidemiological information.  Fact Sheet for Patients:   RoadLapTop.co.za  Fact Sheet for Healthcare Providers: http://kim-miller.com/  This test is not yet approved or  cleared by the [place] FDA and has been authorized for detection and/or diagnosis of SARS-CoV-2 by FDA under an Emergency Use  Authorization (EUA).  This EUA will remain in effect (meaning this test can be used) for the duration of the COVID-19 declaration under Section 564(b)(1) of the Act, 21 U.S.C. section 360bbb-3(b)(1), unless the authorization is terminated or revoked sooner.  Performed at [facility], [address], [place], [place] [address]    No results found.  Pending Labs [employer] (From admission, onward)  Start     Ordered   03/05/23 0500  Basic metabolic panel  Tomorrow morning,   R        03/04/23 1325   03/05/23 0500  CBC  Tomorrow morning,   R        03/04/23 1325   03/04/23 1324  HIV Antibody (routine testing w rflx)  (HIV Antibody (Routine testing w reflex) panel)  Once,   R        03/04/23 1325   03/04/23 1116  Culture, blood (routine x 2)  BLOOD CULTURE X 2,   R (with STAT occurrences)      03/04/23 1115            Vitals/Pain Today's Vitals   03/04/23 1047 03/04/23 1103 03/04/23 1200 03/04/23 1230  BP:  (!) 201/110 (!) 185/99 (!) 155/119  Pulse:  (!) 120 (!) 126 (!) 127  Resp:  (!) 30    Temp:  98 F (36.7 C)    TempSrc:  Oral    SpO2:  98% 97% 97%  Weight: (!) 306.2 kg     Height: 6\' 2"  (1.88 m)     PainSc: 9        Isolation Precautions No active isolations  Medications Medications  linezolid (ZYVOX) IVPB 600 mg (has no administration in time range)  heparin injection 5,000 Units (has no administration in time range)  acetaminophen (TYLENOL) tablet 650 mg (has no administration in time range)    Or  acetaminophen (TYLENOL) suppository 650 mg (has no administration in time range)  ondansetron (ZOFRAN) tablet 4 mg (has no administration in time range)    Or  ondansetron (ZOFRAN) injection 4 mg (has no administration in time range)  albuterol (PROVENTIL) (2.5 MG/3ML) 0.083% nebulizer solution 2.5 mg (has no administration in time range)  hydrALAZINE (APRESOLINE) injection 5 mg (has no administration in time range)  losartan  (COZAAR) tablet 100 mg (has no administration in time range)  piperacillin-tazobactam (ZOSYN) IVPB 3.375 g (has no administration in time range)  furosemide (LASIX) injection 60 mg (has no administration in time range)  piperacillin-tazobactam (ZOSYN) IVPB 3.375 g (0 g Intravenous Stopped 03/04/23 1258)  furosemide (LASIX) injection 40 mg (40 mg Intravenous Given 03/04/23 1346)    Mobility walks

## 2023-03-05 ENCOUNTER — Inpatient Hospital Stay (HOSPITAL_COMMUNITY): Payer: Managed Care, Other (non HMO)

## 2023-03-05 DIAGNOSIS — E876 Hypokalemia: Secondary | ICD-10-CM

## 2023-03-05 DIAGNOSIS — I10 Essential (primary) hypertension: Secondary | ICD-10-CM | POA: Diagnosis not present

## 2023-03-05 DIAGNOSIS — I5031 Acute diastolic (congestive) heart failure: Secondary | ICD-10-CM | POA: Diagnosis not present

## 2023-03-05 DIAGNOSIS — M7989 Other specified soft tissue disorders: Secondary | ICD-10-CM | POA: Diagnosis not present

## 2023-03-05 DIAGNOSIS — L0390 Cellulitis, unspecified: Secondary | ICD-10-CM | POA: Diagnosis not present

## 2023-03-05 DIAGNOSIS — J9601 Acute respiratory failure with hypoxia: Secondary | ICD-10-CM

## 2023-03-05 DIAGNOSIS — G4733 Obstructive sleep apnea (adult) (pediatric): Secondary | ICD-10-CM

## 2023-03-05 LAB — CBC
HCT: 38.9 % — ABNORMAL LOW (ref 39.0–52.0)
Hemoglobin: 11.8 g/dL — ABNORMAL LOW (ref 13.0–17.0)
MCH: 30.3 pg (ref 26.0–34.0)
MCHC: 30.3 g/dL (ref 30.0–36.0)
MCV: 99.7 fL (ref 80.0–100.0)
Platelets: 228 10*3/uL (ref 150–400)
RBC: 3.9 MIL/uL — ABNORMAL LOW (ref 4.22–5.81)
RDW: 16.7 % — ABNORMAL HIGH (ref 11.5–15.5)
WBC: 11.8 10*3/uL — ABNORMAL HIGH (ref 4.0–10.5)
nRBC: 0 % (ref 0.0–0.2)

## 2023-03-05 LAB — BASIC METABOLIC PANEL
Anion gap: 7 (ref 5–15)
BUN: 13 mg/dL (ref 6–20)
CO2: 34 mmol/L — ABNORMAL HIGH (ref 22–32)
Calcium: 7.9 mg/dL — ABNORMAL LOW (ref 8.9–10.3)
Chloride: 95 mmol/L — ABNORMAL LOW (ref 98–111)
Creatinine, Ser: 0.8 mg/dL (ref 0.61–1.24)
GFR, Estimated: >60 mL/min (ref >60)
Glucose, Bld: 124 mg/dL — ABNORMAL HIGH (ref 70–99)
Potassium: 3.3 mmol/L — ABNORMAL LOW (ref 3.5–5.1)
Sodium: 136 mmol/L (ref 135–145)

## 2023-03-05 LAB — ECHOCARDIOGRAM COMPLETE
Height: 74 in
Weight: 10800 [oz_av]

## 2023-03-05 LAB — MAGNESIUM: Magnesium: 1.8 mg/dL (ref 1.7–2.4)

## 2023-03-05 MED ORDER — MELATONIN 5 MG PO TABS
5.0000 mg | ORAL_TABLET | Freq: Every evening | ORAL | Status: DC | PRN
  Administered 2023-03-05 – 2023-03-09 (×5): 5 mg via ORAL
  Filled 2023-03-05 (×5): qty 1

## 2023-03-05 MED ORDER — POTASSIUM CHLORIDE CRYS ER 20 MEQ PO TBCR
40.0000 meq | EXTENDED_RELEASE_TABLET | Freq: Every day | ORAL | Status: DC
  Administered 2023-03-05 – 2023-03-10 (×6): 40 meq via ORAL
  Filled 2023-03-05 (×6): qty 2

## 2023-03-05 NOTE — Plan of Care (Signed)
  Problem: Activity: Goal: Risk for activity intolerance will decrease Outcome: Progressing   Problem: Nutrition: Goal: Adequate nutrition will be maintained Outcome: Progressing   Problem: Coping: Goal: Level of anxiety will decrease Outcome: Progressing   

## 2023-03-05 NOTE — Progress Notes (Signed)
Lower extremity venous duplex completed. Please see CV Procedures for preliminary results.  Shona Simpson, RVT 03/05/23 3:21 PM

## 2023-03-05 NOTE — Progress Notes (Signed)
   03/05/23 2035  BiPAP/CPAP/SIPAP  BiPAP/CPAP/SIPAP Pt Type Adult (prefers self placement)  BiPAP/CPAP/SIPAP DREAMSTATIOND  Mask Type Full face mask  Mask Size Large  Flow Rate 5 lpm  Patient Home Equipment No  Auto Titrate Yes (12-25)  CPAP/SIPAP surface wiped down Yes  BiPAP/CPAP /SiPAP Vitals  Resp (!) 22  Bilateral Breath Sounds Clear;Diminished  MEWS Score/Color  MEWS Score 2  MEWS Score Color Yellow

## 2023-03-05 NOTE — Progress Notes (Signed)
Echocardiogram 2D Echocardiogram has been performed.  Willie Steele 03/05/2023, 12:35 PM

## 2023-03-05 NOTE — Progress Notes (Signed)
PROGRESS NOTE    Mable Thedford  WUJ:[id number] DOB: 1980-10-09 DOA: 03/04/2023 PCP: Tracey Harries, MD    Chief Complaint  Patient presents with   Leg Pain    Brief Narrative:  Patient is a 42 year old gentleman history of super morbid obesity, hypertension, diastolic CHF admitted to the hospital with sepsis felt secondary to cellulitis and acute CHF exacerbation.  Patient placed on empiric antibiotics, IV Lasix.   Assessment & Plan:   Principal Problem:   Sepsis due to cellulitis [facility]) Active Problems:   Acute respiratory failure with hypoxia (HCC)   Hypertension, benign   Morbid obesity (HCC)   OSA on CPAP   Acute diastolic congestive heart failure (HCC)   Hypokalemia  #1 sepsis due to cellulitis, POA -Patient with admission met criteria for sepsis with tachycardia, reported fevers, leukocytosis, lower extremity concerning for cellulitis. -Patient states usually gets cellulitis about once a year. -Patient currently hemodynamically stable, normal lactate, no evidence of endorgan dysfunction. -Blood cultures pending. -Leukocytosis trending down.  Afebrile. -Continue linezolid and Zosyn. -Vancomycin held as felt not an option due to his weight. -Supportive care.  2.  Acute hypoxic respiratory failure -Patient noted to have presented with acute hypoxic respiratory failure noted per patient on presentation to have sats in the 80% on room air on presentation not on home O2. -Etiology likely multifactorial secondary to obesity hypoventilation syndrome, OSA, acute on chronic diastolic CHF exacerbation. -Patient denies any overt chest pain.  Patient unlikely to have a PE as denied any pleurisy. -Due to body habitus and weight unable to get a CT angiogram chest or V/Q study. -If D-dimer is checked will likely be elevated in the setting of sepsis and obesity. -Lower extremity Dopplers pending. -Patient states had significant urine output on diuresis with clinical improvement with  shortness of breath however still requiring O2. -Continue IV Lasix, strict I's and O's, daily weights. -Wean O2 as able to. -Continue CPAP nightly.  3.  Acute on chronic diastolic CHF exacerbation -Last 2D echo 05/2021 nondiagnostic due to very poor echo windows and visualization of cardiac structures and unable to visualize LV endocardium however EF appeared to be within normal range of 55 to 65%.  Right ventricular systolic function was normal. -Repeat 2D echo and able to assess LVEF or wall motion due to inability to visualize endocardial segments from poor acoustical windows.  Unable to optimally evaluate regional wall motion.  Left ventricular diastolic function cannot be evaluated. -Patient denied any chest pain. -Patient states on torsemide 20 mg daily in addition to Cozaar. -Patient currently on Lasix 60 mg IV every 12 hours with urine output of 1.8 L over the past 24 hours. -Per admitting physician patient stated had a dry weight of 650 pounds.  Patient currently 675 pounds. -Continue home regimen Cozaar. --Continue IV Lasix, strict I's and O's, daily weights  4.  Hypokalemia -Secondary to diuresis. -K. Dur 40 mEq daily.  5.  Obesity hypoventilation/OSA -CPAP nightly.  6.  Hypertension -Continue Cozaar. -On IV Lasix.  7.  Super morbid obesity -BMI 86.66 kg/m. -Lifestyle modification. -Outpatient follow-up with PCP.  .    DVT prophylaxis: Heparin Code Status: Full Family Communication: Updated patient.  No family at bedside. Disposition: Likely home when clinically improved.  Status is: Inpatient Remains inpatient appropriate because: Severity of illness   Consultants:  None  Procedures: Chest x-ray 03/04/2023 Plan forms of the left tib-fib 03/04/2023 2D echo 03/05/2023 Lower extremity Dopplers pending  Antimicrobials:  Anti-infectives (From admission, onward)  Start     Dose/Rate Route Frequency Ordered Stop   03/04/23 1800  piperacillin-tazobactam  (ZOSYN) IVPB 3.375 g        3.375 g 12.5 mL/hr over 240 Minutes Intravenous Every 8 hours 03/04/23 1338     03/04/23 1200  linezolid (ZYVOX) IVPB 600 mg        600 mg 300 mL/hr over 60 Minutes Intravenous Every 12 hours 03/04/23 1157     03/04/23 1200  piperacillin-tazobactam (ZOSYN) IVPB 3.375 g        3.375 g 100 mL/hr over 30 Minutes Intravenous  Once 03/04/23 1157 03/04/23 1258         Subjective: Patient laying in bed.  States feels better than on admission.  Feels left lower extremity swelling erythema has improved since admission.  Denies any chest pain.  Denies any significant shortness of breath.  States has been diuresing well.  Objective: Vitals:   03/05/23 0814 03/05/23 0900 03/05/23 1000 03/05/23 1200  BP:  (!) 129/93 (!) 142/64   Pulse:  95 92   Resp:  (!) 31 (!) 23   Temp:    98.4 F (36.9 C)  TempSrc:    Oral  SpO2:  95% 93%   Weight: (!) 306.2 kg     Height:        Intake/Output Summary (Last 24 hours) at 03/05/2023 1456 Last data filed at 03/05/2023 1306 Gross per 24 hour  Intake 1166.55 ml  Output 1000 ml  Net 166.55 ml   Filed Weights   03/04/23 1047 03/05/23 0814  Weight: (!) 306.2 kg (!) 306.2 kg    Examination:  General exam: Appears calm and comfortable  Respiratory system: Diminished breath sounds in the bases.  No rhonchi.  Respiratory effort normal. Cardiovascular system: S1 & S2 heard, RRR.  Unable to assess JVD due to body habitus.  2-3+ pedal edema.  Gastrointestinal system: Abdomen is nondistended, soft and nontender. No organomegaly or masses felt.  Lower abdominal pannus with some edema.  Normal bowel sounds heard. Central nervous system: Alert and oriented. No focal neurological deficits. Extremities: Bilateral lower extremities with 2-3+ pedal edema.  Chronic venous stasis changes bilaterally.  Decreasing erythema.  Some warmth.  Right lower extremities with some sores noted with gauze and dressing.  Symmetric 5 x 5 power. Skin: No  rashes, lesions or ulcers Psychiatry: Judgement and insight appear normal. Mood & affect appropriate.     Data Reviewed: I have personally reviewed following labs and imaging studies  CBC: Recent Labs  Lab 03/04/23 1135 03/05/23 0305  WBC 17.5* 11.8*  NEUTROABS 15.9*  --   HGB 14.2 11.8*  HCT 46.6 38.9*  MCV 100.4* 99.7  PLT 309 228    Basic Metabolic Panel: Recent Labs  Lab 03/04/23 1135 03/05/23 0305 03/05/23 1012  NA 139 136  --   K 4.0 3.3*  --   CL 97* 95*  --   CO2 33* 34*  --   GLUCOSE 119* 124*  --   BUN 16 13  --   CREATININE 0.82 0.80  --   CALCIUM 9.0 7.9*  --   MG  --   --  1.8    GFR: Estimated Creatinine Clearance: 295.3 mL/min (by C-G formula based on SCr of 0.8 mg/dL).  Liver Function Tests: Recent Labs  Lab 03/04/23 1135  AST 26  ALT 38  ALKPHOS 73  BILITOT 0.8  PROT 7.9  ALBUMIN 3.4*    CBG: No results for input(s): "  GLUCAP" in the last 168 hours.   Recent Results (from the past 240 hour(s))  Culture, blood (routine x 2)     Status: None (Preliminary result)   Collection Time: 03/04/23 11:35 AM   Specimen: BLOOD  Result Value Ref Range Status   Specimen Description   Final    BLOOD RIGHT ANTECUBITAL Performed at [facility], [address], [place], [place] [address]    Special Requests   Final    BOTTLES DRAWN AEROBIC AND ANAEROBIC Blood Culture adequate volume Performed at [facility], [address] [place], [place] [address]    Culture   Final    NO GROWTH < 24 HOURS Performed at [facility], [address] [place], [place] [address]    Report Status PENDING  Incomplete  Culture, blood (routine x 2)     Status: None (Preliminary result)   Collection Time: 03/04/23 12:00 PM   Specimen: BLOOD  Result Value Ref Range Status   Specimen Description   Final    BLOOD LEFT ANTECUBITAL Performed at [facility], [address], [place], [place] [address]     Special Requests   Final    BOTTLES DRAWN AEROBIC AND ANAEROBIC Blood Culture adequate volume Performed at [facility], [address], [place], [place] [address]    Culture   Final    NO GROWTH < 24 HOURS Performed at [facility], [address], [place], [place] [address]    Report Status PENDING  Incomplete  SARS Coronavirus 2 by RT PCR (hospital order, performed in [facility] hospital lab) *cepheid single result test* Anterior Nasal Swab     Status: None   Collection Time: 03/04/23 12:32 PM   Specimen: Anterior Nasal Swab  Result Value Ref Range Status   SARS Coronavirus 2 by RT PCR NEGATIVE NEGATIVE Final    Comment: (NOTE) SARS-CoV-2 target nucleic acids are NOT DETECTED.  The SARS-CoV-2 RNA is generally detectable in upper and lower respiratory specimens during the acute phase of infection. The lowest concentration of SARS-CoV-2 viral copies this assay can detect is 250 copies / mL. A negative result does not preclude SARS-CoV-2 infection and should not be used as the sole basis for treatment or other patient management decisions.  A negative result may occur with improper specimen collection / handling, submission of specimen other than nasopharyngeal swab, presence of viral mutation(s) within the areas targeted by this assay, and inadequate number of viral copies (<250 copies / mL). A negative result must be combined with clinical observations, patient history, and epidemiological information.  Fact Sheet for Patients:   RoadLapTop.co.za  Fact Sheet for Healthcare Providers: http://kim-miller.com/  This test is not yet approved or  cleared by the [place] FDA and has been authorized for detection and/or diagnosis of SARS-CoV-2 by FDA under an Emergency Use Authorization (EUA).  This EUA will remain in effect (meaning this test can be used) for the duration of the COVID-19 declaration under  Section 564(b)(1) of the Act, 21 U.S.C. section 360bbb-3(b)(1), unless the authorization is terminated or revoked sooner.  Performed at [facility], [address], [place], [place] [address]   MRSA Next Gen by PCR, Nasal     Status: None   Collection Time: 03/04/23  6:48 PM   Specimen: Nasal Mucosa; Nasal Swab  Result Value Ref Range Status   MRSA by PCR Next Gen NOT DETECTED NOT DETECTED Final  Comment: (NOTE) The GeneXpert MRSA Assay (FDA approved for NASAL specimens only), is one component of a comprehensive MRSA colonization surveillance program. It is not intended to diagnose MRSA infection nor to guide or monitor treatment for MRSA infections. Test performance is not FDA approved in patients less than 55 years old. Performed at [facility], [address], [place], [place] [address]          Radiology Studies: ECHOCARDIOGRAM COMPLETE  Result Date: 03/05/2023    ECHOCARDIOGRAM REPORT   Patient Name:   ADREIN WILLIG Date of Exam: 03/05/2023 Medical Rec #:  [id number]     Height:       74.0 in Accession #:    [id number]    Weight:       675.0 lb Date of Birth:  02/13/1981     BSA:          3.645 m Patient Age:    42 years      BP:           142/64 mmHg Patient Gender: M             HR:           89 bpm. Exam Location:  Inpatient Procedure: 2D Echo, Cardiac Doppler and Color Doppler Indications:    CHF- Acute Diastolic I50.31  History:        Patient has prior history of Echocardiogram examinations, most                 recent 05/30/2021. CHF and Cardiomegaly; Risk                 Factors:Hypertension and Sleep Apnea.  Sonographer:    Lucendia Herrlich RCS Referring Phys: [phone] Coco Sharpnack V Khizar Fiorella  Sonographer Comments: Suboptimal parasternal window, suboptimal subcostal window, no apical window and patient is obese. TDS due to morbid obesity, over 600 pounds. IMPRESSIONS  1. Cannot assess LVF or wall motion due to inability to visualize  endocardial segments from poor acoustical windows. Left ventricular endocardial border not optimally defined to evaluate regional wall motion. Left ventricular diastolic function could not be evaluated.  2. Right ventricular systolic function was not well visualized. The right ventricular size is not well visualized. There is normal pulmonary artery systolic pressure. The estimated right ventricular systolic pressure is 26.2 mmHg.  3. The mitral valve is normal in structure. No evidence of mitral valve regurgitation. No evidence of mitral stenosis.  4. The aortic valve was not well visualized. Aortic valve regurgitation is not visualized.  5. The inferior vena cava is normal in size with greater than 50% respiratory variability, suggesting right atrial pressure of 3 mmHg. FINDINGS  Left Ventricle: Left ventricular ejection fraction, by estimation, is Cannot assess due to inability to visualize endocardial segments from poor acoustical windows%. The left ventricle has Cannot assess function. Left ventricular endocardial border not optimally defined to evaluate regional wall motion. The left ventricular internal cavity size was cannot assess. There is no left ventricular hypertrophy. Left ventricular diastolic function could not be evaluated. Right Ventricle: The right ventricular size is not well visualized. Right vetricular wall thickness was not well visualized. Right ventricular systolic function was not well visualized. There is normal pulmonary artery systolic pressure. The tricuspid regurgitant velocity is 1.67 m/s, and with an assumed right atrial pressure of 15 mmHg, the estimated right ventricular systolic pressure is 26.2 mmHg. Left Atrium: Left atrial size was not well visualized. Right Atrium: Right atrial size was not  well visualized. Pericardium: There is no evidence of pericardial effusion. Mitral Valve: The mitral valve is normal in structure. No evidence of mitral valve regurgitation. No evidence of  mitral valve stenosis. Tricuspid Valve: The tricuspid valve is not well visualized. Tricuspid valve regurgitation is trivial. No evidence of tricuspid stenosis. Aortic Valve: The aortic valve was not well visualized. Aortic valve regurgitation is not visualized. Pulmonic Valve: The pulmonic valve was not well visualized. Pulmonic valve regurgitation is not visualized. No evidence of pulmonic stenosis. Aorta: The aortic root is normal in size and structure. Venous: The inferior vena cava is normal in size with greater than 50% respiratory variability, suggesting right atrial pressure of 3 mmHg. IAS/Shunts: No atrial level shunt detected by color flow Doppler.  IVC IVC diam: 3.40 cm  AORTA Ao Asc diam: 3.65 cm TRICUSPID VALVE TR Peak grad:   11.2 mmHg TR Vmax:        167.00 cm/s Armanda Magic MD Electronically signed by Armanda Magic MD Signature Date/Time: 03/05/2023/1:03:11 PM    Final    DG Tibia/Fibula Left Port  Result Date: 03/04/2023 CLINICAL DATA:  Infection, left leg cellulitis. EXAM: PORTABLE LEFT TIBIA AND FIBULA - 2 VIEW COMPARISON:  None Available. FINDINGS: Cortical margins of the tibia and fibular intact. No fracture. No erosive or bony destructive change. No knee or ankle dislocation. Generalized subcutaneous edema versus habitus. Scattered ill-defined soft tissue calcifications. No soft tissue gas or radiopaque foreign body. IMPRESSION: 1. Generalized subcutaneous edema versus habitus. No soft tissue gas or radiopaque foreign body. 2. No radiographic evidence of osteomyelitis. Electronically Signed   By: Narda Rutherford M.D.   On: 03/04/2023 16:01   DG Chest Portable 1 View  Result Date: 03/04/2023 CLINICAL DATA:  Shortness of breath. EXAM: PORTABLE CHEST 1 VIEW COMPARISON:  05/29/2021 FINDINGS: Stable prominent cardiac silhouette. Peribronchial thickening without focal airspace disease. No pleural effusion. No pneumothorax. More detailed assessment is limited due to soft tissue attenuation  from habitus. IMPRESSION: 1. Peribronchial thickening without focal airspace disease. 2. Stable prominent cardiac silhouette. Electronically Signed   By: Narda Rutherford M.D.   On: 03/04/2023 16:00        Scheduled Meds:  Chlorhexidine Gluconate Cloth  6 each Topical Daily   furosemide  60 mg Intravenous BID   heparin  5,000 Units Subcutaneous Q8H   losartan  100 mg Oral Daily   potassium chloride  40 mEq Oral Daily   Continuous Infusions:  linezolid (ZYVOX) IV 300 mL/hr at 03/05/23 1306   piperacillin-tazobactam (ZOSYN)  IV Stopped (03/05/23 1229)     LOS: 1 day    Time spent: 40 mins    Ramiro Harvest, MD [facility]   To contact the attending provider between 7A-7P or the covering provider during after hours 7P-7A, please log into the web site [email or web address] and access using universal [place] password for that web site. If you do not have the password, please call the hospital operator.  03/05/2023, 2:56 PM

## 2023-03-06 DIAGNOSIS — I10 Essential (primary) hypertension: Secondary | ICD-10-CM | POA: Diagnosis not present

## 2023-03-06 DIAGNOSIS — G4733 Obstructive sleep apnea (adult) (pediatric): Secondary | ICD-10-CM | POA: Diagnosis not present

## 2023-03-06 DIAGNOSIS — I5031 Acute diastolic (congestive) heart failure: Secondary | ICD-10-CM | POA: Diagnosis not present

## 2023-03-06 DIAGNOSIS — L0390 Cellulitis, unspecified: Secondary | ICD-10-CM | POA: Diagnosis not present

## 2023-03-06 LAB — BASIC METABOLIC PANEL
Anion gap: 7 (ref 5–15)
BUN: 14 mg/dL (ref 6–20)
CO2: 36 mmol/L — ABNORMAL HIGH (ref 22–32)
Calcium: 8.1 mg/dL — ABNORMAL LOW (ref 8.9–10.3)
Chloride: 94 mmol/L — ABNORMAL LOW (ref 98–111)
Creatinine, Ser: 0.73 mg/dL (ref 0.61–1.24)
GFR, Estimated: >60 mL/min (ref >60)
Glucose, Bld: 128 mg/dL — ABNORMAL HIGH (ref 70–99)
Potassium: 3.5 mmol/L (ref 3.5–5.1)
Sodium: 137 mmol/L (ref 135–145)

## 2023-03-06 LAB — CBC WITH DIFFERENTIAL/PLATELET
Abs Immature Granulocytes: 0.04 10*3/uL (ref 0.00–0.07)
Basophils Absolute: 0 10*3/uL (ref 0.0–0.1)
Basophils Relative: 0 %
Eosinophils Absolute: 0.1 10*3/uL (ref 0.0–0.5)
Eosinophils Relative: 1 %
HCT: 38.5 % — ABNORMAL LOW (ref 39.0–52.0)
Hemoglobin: 11.6 g/dL — ABNORMAL LOW (ref 13.0–17.0)
Immature Granulocytes: 0 %
Lymphocytes Relative: 16 %
Lymphs Abs: 1.5 10*3/uL (ref 0.7–4.0)
MCH: 30.8 pg (ref 26.0–34.0)
MCHC: 30.1 g/dL (ref 30.0–36.0)
MCV: 102.1 fL — ABNORMAL HIGH (ref 80.0–100.0)
Monocytes Absolute: 0.7 10*3/uL (ref 0.1–1.0)
Monocytes Relative: 8 %
Neutro Abs: 7.1 10*3/uL (ref 1.7–7.7)
Neutrophils Relative %: 75 %
Platelets: 202 10*3/uL (ref 150–400)
RBC: 3.77 MIL/uL — ABNORMAL LOW (ref 4.22–5.81)
RDW: 16.3 % — ABNORMAL HIGH (ref 11.5–15.5)
WBC: 9.6 10*3/uL (ref 4.0–10.5)
nRBC: 0 % (ref 0.0–0.2)

## 2023-03-06 LAB — MAGNESIUM: Magnesium: 2 mg/dL (ref 1.7–2.4)

## 2023-03-06 MED ORDER — SORBITOL 70 % SOLN
30.0000 mL | Status: AC
  Filled 2023-03-06: qty 30

## 2023-03-06 MED ORDER — LINEZOLID 600 MG PO TABS
600.0000 mg | ORAL_TABLET | Freq: Two times a day (BID) | ORAL | Status: DC
  Administered 2023-03-06 – 2023-03-10 (×8): 600 mg via ORAL
  Filled 2023-03-06 (×9): qty 1

## 2023-03-06 NOTE — Progress Notes (Signed)
PROGRESS NOTE    Willie Steele  ZOX:[id number] DOB: 01-Apr-1981 DOA: 03/04/2023 PCP: Tracey Harries, MD    Chief Complaint  Patient presents with   Leg Pain    Brief Narrative:  Patient is a 42 year old gentleman history of super morbid obesity, hypertension, diastolic CHF admitted to the hospital with sepsis felt secondary to cellulitis and acute CHF exacerbation.  Patient placed on empiric antibiotics, IV Lasix.   Assessment & Plan:   Principal Problem:   Sepsis due to cellulitis [facility]) Active Problems:   Acute respiratory failure with hypoxia (HCC)   Hypertension, benign   Morbid obesity (HCC)   OSA on CPAP   Acute diastolic congestive heart failure (HCC)   Hypokalemia  #1 sepsis due to cellulitis, POA -Patient with admission met criteria for sepsis with tachycardia, reported fevers, leukocytosis, lower extremity concerning for cellulitis. -Patient states usually gets cellulitis about once a year. -Patient currently hemodynamically stable, normal lactate, no evidence of endorgan dysfunction. -Blood cultures pending without no growth to date.. -Leukocytosis trending down.  Afebrile. -Continue linezolid and Zosyn. -If continued improvement and blood cultures remain negative could likely transition to oral Zyvox and oral Augmentin in the next 24 hours. -Vancomycin held as felt not an option due to his weight. -Supportive care.  2.  Acute hypoxic respiratory failure -Patient noted to have presented with acute hypoxic respiratory failure noted per patient on presentation to have sats in the 80% on room air on presentation not on home O2. -Etiology likely multifactorial secondary to obesity hypoventilation syndrome, OSA, acute on chronic diastolic CHF exacerbation. -Patient denies any overt chest pain.  Patient unlikely to have a PE as denied any pleurisy. -Due to body habitus and weight unable to get a CT angiogram chest or V/Q study. -If D-dimer is checked will likely be  elevated in the setting of sepsis and obesity. -Lower extremity Dopplers pending. -Patient states had significant urine output on diuresis with clinical improvement with shortness of breath however still requiring O2. -Continue IV Lasix, strict I's and O's, daily weights. -Wean O2 as able to. -Continue CPAP nightly.  3.  Acute on chronic diastolic CHF exacerbation -Last 2D echo 05/2021 nondiagnostic due to very poor echo windows and visualization of cardiac structures and unable to visualize LV endocardium however EF appeared to be within normal range of 55 to 65%.  Right ventricular systolic function was normal. -Repeat 2D echo unable to assess LVEF or wall motion due to inability to visualize endocardial segments from poor acoustical windows.  Unable to optimally evaluate regional wall motion.  Left ventricular diastolic function cannot be evaluated. -Patient denied any chest pain. -Patient states on torsemide 20 mg daily in addition to Cozaar. -Patient currently on Lasix 60 mg IV every 12 hours with urine output of 3.7 L over the past 24 hours. -Per admitting physician patient stated had a dry weight of 650 pounds.  Patient currently 674.61 pounds. -Continue home regimen Cozaar. --Continue IV Lasix, strict I's and O's, daily weights  4.  Hypokalemia -Secondary to diuresis. -Continue K. Dur 40 mEq daily.  5.  Obesity hypoventilation/OSA -CPAP nightly.  6.  Hypertension -Currently on IV Lasix which we will continue for now.   -Continue Cozaar.    7.  Super morbid obesity -BMI 86.66 kg/m. -Lifestyle modification. -Outpatient follow-up with PCP.  .    DVT prophylaxis: Heparin Code Status: Full Family Communication: Updated patient.  No family at bedside. Disposition: Likely home when clinically improved.  Status is: Inpatient Remains inpatient  appropriate because: Severity of illness   Consultants:  None  Procedures: Chest x-ray 03/04/2023 Plan forms of the left  tib-fib 03/04/2023 2D echo 03/05/2023 Lower extremity Dopplers 03/05/2023  Antimicrobials:  Anti-infectives (From admission, onward)    Start     Dose/Rate Route Frequency Ordered Stop   03/04/23 1800  piperacillin-tazobactam (ZOSYN) IVPB 3.375 g        3.375 g 12.5 mL/hr over 240 Minutes Intravenous Every 8 hours 03/04/23 1338     03/04/23 1200  linezolid (ZYVOX) IVPB 600 mg        600 mg 300 mL/hr over 60 Minutes Intravenous Every 12 hours 03/04/23 1157     03/04/23 1200  piperacillin-tazobactam (ZOSYN) IVPB 3.375 g        3.375 g 100 mL/hr over 30 Minutes Intravenous  Once 03/04/23 1157 03/04/23 1258         Subjective: Patient laying in bed.  States he feels shortness of breath and lower extremity edema and cellulitis is slowly improving.  Would like to sit up in the chair and would like to participate with therapy.  Noted to be diuresing well.  Denies any chest pain.   Objective: Vitals:   03/06/23 0701 03/06/23 0800 03/06/23 0900 03/06/23 0911  BP:  137/84 (!) 141/76   Pulse:  94 95   Resp:  (!) 23 (!) 21   Temp:    98 F (36.7 C)  TempSrc:    Oral  SpO2:  99% 94%   Weight: (!) 306 kg     Height:        Intake/Output Summary (Last 24 hours) at 03/06/2023 0941 Last data filed at 03/06/2023 7829 Gross per 24 hour  Intake 1709.73 ml  Output 4500 ml  Net -2790.27 ml   Filed Weights   03/04/23 1047 03/05/23 0814 03/06/23 0701  Weight: (!) 306.2 kg (!) 306.2 kg (!) 306 kg    Examination:  General exam: NAD Respiratory system: Diminished breath sounds in the bases.  No rhonchi.  Fair air movement.  Speaking in full sentences.  No use of accessory muscles of respiration.  Cardiovascular system: Regular rate rhythm no murmurs rubs or gallops.  Distant heart sounds.  Unable to assess JVD due to body habitus.  2+ pedal edema.  Gastrointestinal system: Abdomen is obese, soft, nontender, nondistended, positive bowel sounds.  Lower abdominal pannus edematous but  improving.  Positive bowel sounds.  No rebound.  No guarding.  Central nervous system: Alert and oriented. No focal neurological deficits. Extremities: Bilateral lower extremities with 2+ pedal edema.  Chronic venous stasis changes bilaterally.  Decreasing erythema.  Some warmth.  Right lower extremities with some sores noted with gauze and dressing.  Symmetric 5 x 5 power. Skin: No rashes, lesions or ulcers Psychiatry: Judgement and insight appear normal. Mood & affect appropriate.     Data Reviewed: I have personally reviewed following labs and imaging studies  CBC: Recent Labs  Lab 03/04/23 1135 03/05/23 0305 03/06/23 0309  WBC 17.5* 11.8* 9.6  NEUTROABS 15.9*  --  7.1  HGB 14.2 11.8* 11.6*  HCT 46.6 38.9* 38.5*  MCV 100.4* 99.7 102.1*  PLT 309 228 202    Basic Metabolic Panel: Recent Labs  Lab 03/04/23 1135 03/05/23 0305 03/05/23 1012 03/06/23 0309  NA 139 136  --  137  K 4.0 3.3*  --  3.5  CL 97* 95*  --  94*  CO2 33* 34*  --  36*  GLUCOSE 119* 124*  --  128*  BUN 16 13  --  14  CREATININE 0.82 0.80  --  0.73  CALCIUM 9.0 7.9*  --  8.1*  MG  --   --  1.8 2.0    GFR: Estimated Creatinine Clearance: 295.1 mL/min (by C-G formula based on SCr of 0.73 mg/dL).  Liver Function Tests: Recent Labs  Lab 03/04/23 1135  AST 26  ALT 38  ALKPHOS 73  BILITOT 0.8  PROT 7.9  ALBUMIN 3.4*    CBG: No results for input(s): "GLUCAP" in the last 168 hours.   Recent Results (from the past 240 hour(s))  Culture, blood (routine x 2)     Status: None (Preliminary result)   Collection Time: 03/04/23 11:35 AM   Specimen: BLOOD  Result Value Ref Range Status   Specimen Description   Final    BLOOD RIGHT ANTECUBITAL Performed at [facility], [address] [place], [place] [address]    Special Requests   Final    BOTTLES DRAWN AEROBIC AND ANAEROBIC Blood Culture adequate volume Performed at [facility], [address],  [place], [place] [address]    Culture   Final    NO GROWTH 2 DAYS Performed at [facility], [address] [place], [place] [address]    Report Status PENDING  Incomplete  Culture, blood (routine x 2)     Status: None (Preliminary result)   Collection Time: 03/04/23 12:00 PM   Specimen: BLOOD  Result Value Ref Range Status   Specimen Description   Final    BLOOD LEFT ANTECUBITAL Performed at [facility], [address] [place], [place] [address]    Special Requests   Final    BOTTLES DRAWN AEROBIC AND ANAEROBIC Blood Culture adequate volume Performed at [facility], [address], [place], [place] [address]    Culture   Final    NO GROWTH 2 DAYS Performed at [facility], [address], [place], [place] [address]    Report Status PENDING  Incomplete  SARS Coronavirus 2 by RT PCR (hospital order, performed in [facility] hospital lab) *cepheid single result test* Anterior Nasal Swab     Status: None   Collection Time: 03/04/23 12:32 PM   Specimen: Anterior Nasal Swab  Result Value Ref Range Status   SARS Coronavirus 2 by RT PCR NEGATIVE NEGATIVE Final    Comment: (NOTE) SARS-CoV-2 target nucleic acids are NOT DETECTED.  The SARS-CoV-2 RNA is generally detectable in upper and lower respiratory specimens during the acute phase of infection. The lowest concentration of SARS-CoV-2 viral copies this assay can detect is 250 copies / mL. A negative result does not preclude SARS-CoV-2 infection and should not be used as the sole basis for treatment or other patient management decisions.  A negative result may occur with improper specimen collection / handling, submission of specimen other than nasopharyngeal swab, presence of viral mutation(s) within the areas targeted by this assay, and inadequate number of viral copies (<250 copies / mL). A negative result must be combined with clinical observations, patient history, and  epidemiological information.  Fact Sheet for Patients:   RoadLapTop.co.za  Fact Sheet for Healthcare Providers: http://kim-miller.com/  This test is not yet approved or  cleared by the [place] FDA and has been authorized for detection and/or diagnosis of SARS-CoV-2 by FDA under an Emergency Use Authorization (EUA).  This EUA will remain in effect (meaning this test can be used)  for the duration of the COVID-19 declaration under Section 564(b)(1) of the Act, 21 U.S.C. section 360bbb-3(b)(1), unless the authorization is terminated or revoked sooner.  Performed at [facility], [address] [place], [place] [address]   MRSA Next Gen by PCR, Nasal     Status: None   Collection Time: 03/04/23  6:48 PM   Specimen: Nasal Mucosa; Nasal Swab  Result Value Ref Range Status   MRSA by PCR Next Gen NOT DETECTED NOT DETECTED Final    Comment: (NOTE) The GeneXpert MRSA Assay (FDA approved for NASAL specimens only), is one component of a comprehensive MRSA colonization surveillance program. It is not intended to diagnose MRSA infection nor to guide or monitor treatment for MRSA infections. Test performance is not FDA approved in patients less than 85 years old. Performed at [facility], [address], [place], [place] [address]          Radiology Studies: VAS [place] LOWER EXTREMITY VENOUS (DVT)  Result Date: 03/05/2023  Lower Venous DVT Study Patient Name:  Willie Steele  Date of Exam:   03/05/2023 Medical Rec #: [id number]      Accession #:    [id number] Date of Birth: 1981-03-16      Patient Gender: M Patient Age:   70 years Exam Location:  [facility] Procedure:      VAS [place] LOWER EXTREMITY VENOUS (DVT) Referring Phys: MIR [facility] --------------------------------------------------------------------------------  Indications: Swelling, and Edema.  Risk Factors: Recent extended travel obesity.  Limitations: Body habitus, bandages and poor ultrasound/tissue interface. Comparison Study: No significant changes seen since prior exam 05/30/21 Performing Technologist: Shona Simpson  Examination Guidelines: A complete evaluation includes B-mode imaging, spectral Doppler, color Doppler, and power Doppler as needed of all accessible portions of each vessel. Bilateral testing is considered an integral part of a complete examination. Limited examinations for reoccurring indications may be performed as noted. The reflux portion of the exam is performed with the patient in reverse Trendelenburg.  +---------+---------------+---------+-----------+----------+-------------------+ RIGHT    CompressibilityPhasicitySpontaneityPropertiesThrombus Aging      +---------+---------------+---------+-----------+----------+-------------------+ CFV                                                   Not well visualized +---------+---------------+---------+-----------+----------+-------------------+ SFJ                                                   Not well visualized +---------+---------------+---------+-----------+----------+-------------------+ FV Prox  Full                                                             +---------+---------------+---------+-----------+----------+-------------------+ FV Mid   Full           Yes      Yes                                      +---------+---------------+---------+-----------+----------+-------------------+ FV Distal  Yes      Yes                  Patent by color     +---------+---------------+---------+-----------+----------+-------------------+ PFV                                                   Not well visualized +---------+---------------+---------+-----------+----------+-------------------+ POP                     Yes      Yes                  Patent by color      +---------+---------------+---------+-----------+----------+-------------------+ PTV                                                   Patent by color     +---------+---------------+---------+-----------+----------+-------------------+ PERO                                                  Not well visualized +---------+---------------+---------+-----------+----------+-------------------+   +---------+---------------+---------+-----------+----------+-------------------+ LEFT     CompressibilityPhasicitySpontaneityPropertiesThrombus Aging      +---------+---------------+---------+-----------+----------+-------------------+ CFV                                                   Not well visualized +---------+---------------+---------+-----------+----------+-------------------+ SFJ                                                   Not well visualized +---------+---------------+---------+-----------+----------+-------------------+ FV Prox  Full                                                             +---------+---------------+---------+-----------+----------+-------------------+ FV Mid   Full           Yes      Yes                                      +---------+---------------+---------+-----------+----------+-------------------+ FV Distal               Yes      Yes                                      +---------+---------------+---------+-----------+----------+-------------------+ PFV  Not well visualized +---------+---------------+---------+-----------+----------+-------------------+ POP      Full           Yes      Yes                                      +---------+---------------+---------+-----------+----------+-------------------+ PTV                                                   Not well visualized +---------+---------------+---------+-----------+----------+-------------------+  PERO                                                  Not well visualized +---------+---------------+---------+-----------+----------+-------------------+     Summary: RIGHT: - There is no evidence of deep vein thrombosis in the lower extremity.  - No cystic structure found in the popliteal fossa.  LEFT: - There is no evidence of deep vein thrombosis in the lower extremity.  - No cystic structure found in the popliteal fossa.  *See table(s) above for measurements and observations. Electronically signed by Carolynn Sayers on 03/05/2023 at 4:06:06 PM.    Final    ECHOCARDIOGRAM COMPLETE  Result Date: 03/05/2023    ECHOCARDIOGRAM REPORT   Patient Name:   Willie Steele Date of Exam: 03/05/2023 Medical Rec #:  [id number]     Height:       74.0 in Accession #:    [id number]    Weight:       675.0 lb Date of Birth:  November 13, 1980     BSA:          3.645 m Patient Age:    42 years      BP:           142/64 mmHg Patient Gender: M             HR:           89 bpm. Exam Location:  Inpatient Procedure: 2D Echo, Cardiac Doppler and Color Doppler Indications:    CHF- Acute Diastolic I50.31  History:        Patient has prior history of Echocardiogram examinations, most                 recent 05/30/2021. CHF and Cardiomegaly; Risk                 Factors:Hypertension and Sleep Apnea.  Sonographer:    Lucendia Herrlich RCS Referring Phys: [phone] [employer] [facility] V Luken Shadowens  Sonographer Comments: Suboptimal parasternal window, suboptimal subcostal window, no apical window and patient is obese. TDS due to morbid obesity, over 600 pounds. IMPRESSIONS  1. Cannot assess LVF or wall motion due to inability to visualize endocardial segments from poor acoustical windows. Left ventricular endocardial border not optimally defined to evaluate regional wall motion. Left ventricular diastolic function could not be evaluated.  2. Right ventricular systolic function was not well visualized. The right ventricular size is not well visualized. There is  normal pulmonary artery systolic pressure. The estimated right ventricular systolic pressure is 26.2 mmHg.  3. The mitral valve is normal in structure. No evidence of mitral valve regurgitation. No evidence of mitral stenosis.  4. The aortic valve was not well visualized. Aortic valve regurgitation is not visualized.  5. The inferior vena cava is normal in size with greater than 50% respiratory variability, suggesting right atrial pressure of 3 mmHg. FINDINGS  Left Ventricle: Left ventricular ejection fraction, by estimation, is Cannot assess due to inability to visualize endocardial segments from poor acoustical windows%. The left ventricle has Cannot assess function. Left ventricular endocardial border not optimally defined to evaluate regional wall motion. The left ventricular internal cavity size was cannot assess. There is no left ventricular hypertrophy. Left ventricular diastolic function could not be evaluated. Right Ventricle: The right ventricular size is not well visualized. Right vetricular wall thickness was not well visualized. Right ventricular systolic function was not well visualized. There is normal pulmonary artery systolic pressure. The tricuspid regurgitant velocity is 1.67 m/s, and with an assumed right atrial pressure of 15 mmHg, the estimated right ventricular systolic pressure is 26.2 mmHg. Left Atrium: Left atrial size was not well visualized. Right Atrium: Right atrial size was not well visualized. Pericardium: There is no evidence of pericardial effusion. Mitral Valve: The mitral valve is normal in structure. No evidence of mitral valve regurgitation. No evidence of mitral valve stenosis. Tricuspid Valve: The tricuspid valve is not well visualized. Tricuspid valve regurgitation is trivial. No evidence of tricuspid stenosis. Aortic Valve: The aortic valve was not well visualized. Aortic valve regurgitation is not visualized. Pulmonic Valve: The pulmonic valve was not well visualized.  Pulmonic valve regurgitation is not visualized. No evidence of pulmonic stenosis. Aorta: The aortic root is normal in size and structure. Venous: The inferior vena cava is normal in size with greater than 50% respiratory variability, suggesting right atrial pressure of 3 mmHg. IAS/Shunts: No atrial level shunt detected by color flow Doppler.  IVC IVC diam: 3.40 cm  AORTA Ao Asc diam: 3.65 cm TRICUSPID VALVE TR Peak grad:   11.2 mmHg TR Vmax:        167.00 cm/s Armanda Magic MD Electronically signed by Armanda Magic MD Signature Date/Time: 03/05/2023/1:03:11 PM    Final    DG Tibia/Fibula Left Port  Result Date: 03/04/2023 CLINICAL DATA:  Infection, left leg cellulitis. EXAM: PORTABLE LEFT TIBIA AND FIBULA - 2 VIEW COMPARISON:  None Available. FINDINGS: Cortical margins of the tibia and fibular intact. No fracture. No erosive or bony destructive change. No knee or ankle dislocation. Generalized subcutaneous edema versus habitus. Scattered ill-defined soft tissue calcifications. No soft tissue gas or radiopaque foreign body. IMPRESSION: 1. Generalized subcutaneous edema versus habitus. No soft tissue gas or radiopaque foreign body. 2. No radiographic evidence of osteomyelitis. Electronically Signed   By: Narda Rutherford M.D.   On: 03/04/2023 16:01   DG Chest Portable 1 View  Result Date: 03/04/2023 CLINICAL DATA:  Shortness of breath. EXAM: PORTABLE CHEST 1 VIEW COMPARISON:  05/29/2021 FINDINGS: Stable prominent cardiac silhouette. Peribronchial thickening without focal airspace disease. No pleural effusion. No pneumothorax. More detailed assessment is limited due to soft tissue attenuation from habitus. IMPRESSION: 1. Peribronchial thickening without focal airspace disease. 2. Stable prominent cardiac silhouette. Electronically Signed   By: Narda Rutherford M.D.   On: 03/04/2023 16:00        Scheduled Meds:  Chlorhexidine Gluconate Cloth  6 each Topical Daily   furosemide  60 mg Intravenous BID    heparin  5,000 Units Subcutaneous Q8H   losartan  100 mg Oral Daily   potassium chloride  40 mEq Oral Daily   Continuous Infusions:  linezolid (  ZYVOX) IV Stopped (03/06/23 1610)   piperacillin-tazobactam (ZOSYN)  IV 3.375 g (03/06/23 0926)     LOS: 2 days    Time spent: 40 mins    Ramiro Harvest, MD [facility]   To contact the attending provider between 7A-7P or the covering provider during after hours 7P-7A, please log into the web site [email or web address] and access using universal [place] password for that web site. If you do not have the password, please call the hospital operator.  03/06/2023, 9:41 AM

## 2023-03-06 NOTE — Progress Notes (Signed)
   03/06/23 1931  BiPAP/CPAP/SIPAP  BiPAP/CPAP/SIPAP Pt Type Adult (prefers self placement)  BiPAP/CPAP/SIPAP DREAMSTATIOND  Mask Type Full face mask  Mask Size Large  Flow Rate 4 lpm  Patient Home Equipment No  Auto Titrate Yes (12-25)  CPAP/SIPAP surface wiped down Yes  BiPAP/CPAP /SiPAP Vitals  Pulse Rate (!) 105  Resp 17  SpO2 98 %  Bilateral Breath Sounds Clear;Diminished  MEWS Score/Color  MEWS Score 1  MEWS Score Color Green

## 2023-03-06 NOTE — Plan of Care (Signed)

## 2023-03-06 NOTE — Evaluation (Signed)
Physical Therapy Evaluation Patient Details Name: Willie Steele MRN: [id number] DOB: Sep 01, 1980 Today's Date: 03/06/2023  History of Present Illness  Patient is a 42 year old gentleman F admitted to the hospital with sepsis felt secondary to cellulitis and acute CHF exacerbation.  Patient placed on empiric antibiotics, IV Lasix. history of super morbid obesity, hypertension, diastolic CHF  Clinical Impression  Pt admitted with above diagnosis. Pt ambulated 30' + 18' with straight cane and seated rest break. SpO2 85% on 6L O2 and HR 125 with walking, distance limited by fatigue & dyspnea,  94% on 5L O2 at rest.  Pt currently with functional limitations due to the deficits listed below (see PT Problem List). Pt will benefit from acute skilled PT to increase their independence and safety with mobility to allow discharge.           If plan is discharge home, recommend the following: A little help with bathing/dressing/bathroom;Assistance with cooking/housework;Assist for transportation;Help with stairs or ramp for entrance   Can travel by private vehicle        Equipment Recommendations None recommended by PT  Recommendations for Other Services       Functional Status Assessment Patient has had a recent decline in their functional status and demonstrates the ability to make significant improvements in function in a reasonable and predictable amount of time.     Precautions / Restrictions Precautions Precautions: Fall Precaution Comments: 1 fall in past 6 months, tripped on carpet Restrictions Weight Bearing Restrictions: No      Mobility  Bed Mobility               General bed mobility comments: up in recliner    Transfers Overall transfer level: Modified independent Equipment used: None               General transfer comment: used armrests for sit to stand from recliner    Ambulation/Gait Ambulation/Gait assistance: Supervision Gait Distance (Feet): 30  Feet Assistive device: Straight cane Gait Pattern/deviations: Wide base of support, Decreased stride length Gait velocity: decr     General Gait Details: 68' + 18' with seated rest break, pt ambulated with 6L O2, SpO2 85% on 6L walking, 94% on 5L O2 at rest prior to activity, VCs for PLB, distance limited by fatigue/dyspnea  Stairs            Wheelchair Mobility     Tilt Bed    Modified Rankin (Stroke Patients Only)       Balance Overall balance assessment: Modified Independent                                           Pertinent Vitals/Pain Pain Assessment Pain Assessment: No/denies pain    Home Living Family/patient expects to be discharged to:: Private residence Living Arrangements: Alone Available Help at Discharge: Family;Available PRN/intermittently Type of Home: Apartment Home Access: Level entry       Home Layout: One level Home Equipment: Cane - single point;Shower seat      Prior Function Prior Level of Function : Independent/Modified Independent             Mobility Comments: uses SPC when he has knee pain, h/o 1 fall in past 6 months ADLs Comments: showers in walk in shower with shower seat     Extremity/Trunk Assessment   Upper Extremity Assessment Upper Extremity Assessment: Overall WFL for tasks  assessed    Lower Extremity Assessment Lower Extremity Assessment: Overall WFL for tasks assessed    Cervical / Trunk Assessment Cervical / Trunk Assessment: Normal  Communication   Communication Communication: No apparent difficulties  Cognition Arousal: Alert Behavior During Therapy: WFL for tasks assessed/performed Overall Cognitive Status: Within Functional Limits for tasks assessed                                          General Comments      Exercises     Assessment/Plan    PT Assessment Patient needs continued PT services  PT Problem List Cardiopulmonary status limiting  activity;Decreased activity tolerance       PT Treatment Interventions Therapeutic activities;Gait training;Therapeutic exercise;DME instruction;Functional mobility training    PT Goals (Current goals can be found in the Care Plan section)  Acute Rehab PT Goals Patient Stated Goal: to be able to walk farther PT Goal Formulation: With patient Time For Goal Achievement: 03/20/23 Potential to Achieve Goals: Good    Frequency Min 1X/week     Co-evaluation               AM-PAC PT "6 Clicks" Mobility  Outcome Measure Help needed turning from your back to your side while in a flat bed without using bedrails?: A Little Help needed moving from lying on your back to sitting on the side of a flat bed without using bedrails?: A Little Help needed moving to and from a bed to a chair (including a wheelchair)?: None Help needed standing up from a chair using your arms (e.g., wheelchair or bedside chair)?: None Help needed to walk in hospital room?: None Help needed climbing 3-5 steps with a railing? : A Little 6 Click Score: 21    End of Session Equipment Utilized During Treatment: Oxygen Activity Tolerance: Patient limited by fatigue Patient left: in chair;with call bell/phone within reach Nurse Communication: Mobility status PT Visit Diagnosis: Difficulty in walking, not elsewhere classified (R26.2)    Time: 4401-0272 PT Time Calculation (min) (ACUTE ONLY): 23 min   Charges:   PT Evaluation $PT Eval Moderate Complexity: 1 Mod PT Treatments $Gait Training: 8-22 mins PT General Charges $$ ACUTE PT VISIT: 1 Visit         Tamala Ser PT 03/06/2023  Acute Rehabilitation Services  Office [phone]

## 2023-03-07 DIAGNOSIS — I5031 Acute diastolic (congestive) heart failure: Secondary | ICD-10-CM | POA: Diagnosis not present

## 2023-03-07 DIAGNOSIS — G4733 Obstructive sleep apnea (adult) (pediatric): Secondary | ICD-10-CM | POA: Diagnosis not present

## 2023-03-07 DIAGNOSIS — I10 Essential (primary) hypertension: Secondary | ICD-10-CM | POA: Diagnosis not present

## 2023-03-07 DIAGNOSIS — L0390 Cellulitis, unspecified: Secondary | ICD-10-CM | POA: Diagnosis not present

## 2023-03-07 LAB — MAGNESIUM: Magnesium: 2.1 mg/dL (ref 1.7–2.4)

## 2023-03-07 LAB — CBC
HCT: 38.6 % — ABNORMAL LOW (ref 39.0–52.0)
Hemoglobin: 11.5 g/dL — ABNORMAL LOW (ref 13.0–17.0)
MCH: 30.4 pg (ref 26.0–34.0)
MCHC: 29.8 g/dL — ABNORMAL LOW (ref 30.0–36.0)
MCV: 102.1 fL — ABNORMAL HIGH (ref 80.0–100.0)
Platelets: 211 10*3/uL (ref 150–400)
RBC: 3.78 MIL/uL — ABNORMAL LOW (ref 4.22–5.81)
RDW: 15.9 % — ABNORMAL HIGH (ref 11.5–15.5)
WBC: 8.1 10*3/uL (ref 4.0–10.5)
nRBC: 0 % (ref 0.0–0.2)

## 2023-03-07 LAB — BASIC METABOLIC PANEL
Anion gap: 8 (ref 5–15)
BUN: 16 mg/dL (ref 6–20)
CO2: 36 mmol/L — ABNORMAL HIGH (ref 22–32)
Calcium: 8.1 mg/dL — ABNORMAL LOW (ref 8.9–10.3)
Chloride: 91 mmol/L — ABNORMAL LOW (ref 98–111)
Creatinine, Ser: 0.76 mg/dL (ref 0.61–1.24)
GFR, Estimated: >60 mL/min (ref >60)
Glucose, Bld: 125 mg/dL — ABNORMAL HIGH (ref 70–99)
Potassium: 3.5 mmol/L (ref 3.5–5.1)
Sodium: 135 mmol/L (ref 135–145)

## 2023-03-07 MED ORDER — POTASSIUM CHLORIDE CRYS ER 20 MEQ PO TBCR
40.0000 meq | EXTENDED_RELEASE_TABLET | Freq: Once | ORAL | Status: AC
  Administered 2023-03-07: 40 meq via ORAL
  Filled 2023-03-07: qty 2

## 2023-03-07 NOTE — Progress Notes (Signed)
   03/07/23 2105  BiPAP/CPAP/SIPAP  BiPAP/CPAP/SIPAP Pt Type Adult  BiPAP/CPAP/SIPAP DREAMSTATIOND  Mask Type Full face mask  Mask Size Large  Flow Rate 5 lpm  Patient Home Equipment No  Auto Titrate Yes (15-25)  CPAP/SIPAP surface wiped down Yes  BiPAP/CPAP /SiPAP Vitals  Bilateral Breath Sounds Clear;Diminished

## 2023-03-07 NOTE — Plan of Care (Signed)
  Problem: Education: Goal: Knowledge of General Education information will improve Description: Including pain rating scale, medication(s)/side effects and non-pharmacologic comfort measures Outcome: Progressing   Problem: Activity: Goal: Risk for activity intolerance will decrease Outcome: Progressing   Problem: Nutrition: Goal: Adequate nutrition will be maintained Outcome: Progressing   

## 2023-03-07 NOTE — Plan of Care (Signed)
  Problem: Education: Goal: Knowledge of General Education information will improve Description: Including pain rating scale, medication(s)/side effects and non-pharmacologic comfort measures Outcome: Progressing   Problem: Clinical Measurements: Goal: Ability to maintain clinical measurements within normal limits will improve Outcome: Progressing Goal: Will remain free from infection Outcome: Progressing   Problem: Health Behavior/Discharge Planning: Goal: Ability to manage health-related needs will improve Outcome: Adequate for Discharge

## 2023-03-07 NOTE — Evaluation (Signed)
Occupational Therapy Evaluation Patient Details Name: Willie Steele MRN: [id number] DOB: 07/06/1980 Today's Date: 03/07/2023   History of Present Illness Patient is a 42 year old male who presented with fever and possible cellulitis in LLE with current treatment ongoing for RLE. Patient was admitted with sepsis secondary to cellulitis, acute hypoxic respiratory failure. PMH: acute on chronic CHF, hypokalemia, HTN, obesity hypoventilation, OSA, super morbid obesity,   Clinical Impression   Patient evaluated by Occupational Therapy with no further acute OT needs identified. All education has been completed and the patient has no further questions. Patient is at baseline for ADLs at this time while on 2L/min. Patient reported that he would do better at home.  See below for any follow-up Occupational Therapy or equipment needs. OT is signing off. Thank you for this referral.        If plan is discharge home, recommend the following:      Functional Status Assessment  Patient has not had a recent decline in their functional status  Equipment Recommendations  None recommended by OT       Precautions / Restrictions Precautions Precautions: Fall Precaution Comments: monitor O2 Restrictions Weight Bearing Restrictions: No      Mobility Bed Mobility               General bed mobility comments: up in recliner and returned to the same.        Balance Overall balance assessment: Modified Independent           ADL either performed or assessed with clinical judgement   ADL Overall ADL's : At baseline         General ADL Comments: patient reported being able to get BLE up onto bed at home. patient unable to [facility] at bed level here with bed smaller than his typical bed at home. patient was able to transfer and walk into hallway with supervision with O2 ranging from 91% to 96% on 2L/min. patient was noted to drop to 86% on 1L/min during discussion seated in recliner. patient  reported living in 16s at home with movement. patient had good self awareness of activity tolerance and when to take rest breaks. patient endorsed that he does not need OT at this time.     Vision   Vision Assessment?: No apparent visual deficits            Pertinent Vitals/Pain Pain Assessment Pain Assessment: No/denies pain     Extremity/Trunk Assessment Upper Extremity Assessment Upper Extremity Assessment: Overall WFL for tasks assessed   Lower Extremity Assessment Lower Extremity Assessment: Defer to PT evaluation   Cervical / Trunk Assessment Cervical / Trunk Assessment: Normal      Cognition Arousal: Alert Behavior During Therapy: WFL for tasks assessed/performed Overall Cognitive Status: Within Functional Limits for tasks assessed                      Home Living Family/patient expects to be discharged to:: Private residence Living Arrangements: Alone Available Help at Discharge: Family;Available PRN/intermittently Type of Home: Apartment Home Access: Level entry     Home Layout: One level     Bathroom Shower/Tub: Walk-in shower         Home Equipment: Cane - single point;Shower seat          Prior Functioning/Environment Prior Level of Function : Independent/Modified Independent             Mobility Comments: uses SPC when he has knee pain, h/o  1 fall in past 6 months ADLs Comments: showers in walk in shower with shower seat                 OT Goals(Current goals can be found in the care plan section) Acute Rehab OT Goals Patient Stated Goal: to go home OT Goal Formulation: All assessment and education complete, DC therapy  OT Frequency:         AM-PAC OT "6 Clicks" Daily Activity     Outcome Measure Help from another person eating meals?: None Help from another person taking care of personal grooming?: None Help from another person toileting, which includes using toliet, bedpan, or urinal?: None Help from another person  bathing (including washing, rinsing, drying)?: A Little Help from another person to put on and taking off regular upper body clothing?: None Help from another person to put on and taking off regular lower body clothing?: A Little (limited by environment) 6 Click Score: 22   End of Session Equipment Utilized During Treatment: Oxygen Nurse Communication: Mobility status  Activity Tolerance: Patient tolerated treatment well Patient left: in chair;with call bell/phone within reach  OT Visit Diagnosis: Unsteadiness on feet (R26.81)                Time: 5621-3086 OT Time Calculation (min): 23 min Charges:  OT General Charges $OT Visit: 1 Visit OT Evaluation $OT Eval Low Complexity: 1 Low OT Treatments $Self Care/Home Management : 8-22 mins  Rosalio Loud, MS Acute Rehabilitation Department Office# [phone]   Selinda Flavin 03/07/2023, 4:46 PM

## 2023-03-07 NOTE — Progress Notes (Signed)
PROGRESS NOTE    Willie Steele  XBJ:[id number] DOB: 12-11-1980 DOA: 03/04/2023 PCP: Tracey Harries, MD    Chief Complaint  Patient presents with   Leg Pain    Brief Narrative:  Patient is a 42 year old gentleman history of super morbid obesity, hypertension, diastolic CHF admitted to the hospital with sepsis felt secondary to cellulitis and acute CHF exacerbation.  Patient placed on empiric antibiotics, IV Lasix.   Assessment & Plan:   Principal Problem:   Sepsis due to cellulitis [facility]) Active Problems:   Acute respiratory failure with hypoxia (HCC)   Hypertension, benign   Morbid obesity (HCC)   OSA on CPAP   Acute diastolic congestive heart failure (HCC)   Hypokalemia  #1 sepsis due to cellulitis, POA -Patient with admission met criteria for sepsis with tachycardia, reported fevers, leukocytosis, lower extremity concerning for cellulitis. -Patient states usually gets cellulitis about once a year. -Patient currently hemodynamically stable, normal lactate, no evidence of endorgan dysfunction. -Blood cultures pending without no growth to date.. -Leukocytosis trending down.  Afebrile. -Blood cultures with no growth to date x 3 days. -IV Zosyn discontinued on 03/06/2023. -IV linezolid changed to oral linezolid and will treat empirically for 7 to 10 days. -Continue linezolid and Zosyn. -Supportive care.  2.  Acute hypoxic respiratory failure -Patient noted to have presented with acute hypoxic respiratory failure noted per patient on presentation to have sats in the 80% on room air on presentation not on home O2. -Patient noted to have a baseline sats ranging anywhere from 89 to 93% in review of office records on epic. -Etiology likely multifactorial secondary to obesity hypoventilation syndrome, OSA, acute on chronic diastolic CHF exacerbation. -Patient denies any overt chest pain.  Patient unlikely to have a PE as denied any pleurisy. -Due to body habitus and weight unable  to get a CT angiogram chest or V/Q study. -If D-dimer is checked will likely be elevated in the setting of sepsis and obesity. -Lower extremity Dopplers pending. -Patient states had significant urine output on diuresis with clinical improvement with shortness of breath however still requiring O2. -Noted to have required 5 L O2 being weaned down currently on 2 to 3 L with sats of 92 to 94%. -Continue IV Lasix, strict I's and O's, daily weights. -Once patient is adequately diuresed and transition to oral Demadex may need to go home on an increased dose of Demadex at 40 mg daily with close outpatient follow-up with PCP. -Wean O2 as able to. -Continue CPAP nightly. -Will need outpatient referral to pulmonary for OSA and to cardiology for CHF.  3.  Acute on chronic diastolic CHF exacerbation -Last 2D echo 05/2021 nondiagnostic due to very poor echo windows and visualization of cardiac structures and unable to visualize LV endocardium however EF appeared to be within normal range of 55 to 65%.  Right ventricular systolic function was normal. -Repeat 2D echo unable to assess LVEF or wall motion due to inability to visualize endocardial segments from poor acoustical windows.  Unable to optimally evaluate regional wall motion.  Left ventricular diastolic function cannot be evaluated. -Patient denied any chest pain. -Patient states on torsemide 20 mg daily in addition to Cozaar. -Patient currently on Lasix 60 mg IV every 12 hours with urine output of 5.170 L over the past 24 hours. -Per admitting physician patient stated had a dry weight of 650 pounds.  Patient currently 718 pounds. -Unable to rely on accuracy of weights. -Continue home regimen Cozaar. -- Strict I's and O's, daily  weights.  -Will need outpatient follow-up with cardiology.  4.  Hypokalemia -Secondary to diuresis. -Continue K. Dur 40 mEq daily.  5.  Obesity hypoventilation/OSA -CPAP nightly.  6.  Hypertension -Continue IV Lasix.    -Continue Cozaar.   7.  Super morbid obesity -BMI 86.66 kg/m. -Lifestyle modification. -Outpatient follow-up with PCP.  .    DVT prophylaxis: Heparin Code Status: Full Family Communication: Updated patient.  No family at bedside. Disposition: Likely home when clinically improved with adequate diuresis.  Status is: Inpatient Remains inpatient appropriate because: Severity of illness   Consultants:  None  Procedures: Chest x-ray 03/04/2023 Plan forms of the left tib-fib 03/04/2023 2D echo 03/05/2023 Lower extremity Dopplers 03/05/2023  Antimicrobials:  Anti-infectives (From admission, onward)    Start     Dose/Rate Route Frequency Ordered Stop   03/06/23 2200  linezolid (ZYVOX) tablet 600 mg        600 mg Oral Every 12 hours 03/06/23 1036     03/04/23 1800  piperacillin-tazobactam (ZOSYN) IVPB 3.375 g  Status:  Discontinued        3.375 g 12.5 mL/hr over 240 Minutes Intravenous Every 8 hours 03/04/23 1338 03/06/23 1036   03/04/23 1200  linezolid (ZYVOX) IVPB 600 mg  Status:  Discontinued        600 mg 300 mL/hr over 60 Minutes Intravenous Every 12 hours 03/04/23 1157 03/06/23 1036   03/04/23 1200  piperacillin-tazobactam (ZOSYN) IVPB 3.375 g        3.375 g 100 mL/hr over 30 Minutes Intravenous  Once 03/04/23 1157 03/04/23 1258         Subjective: Patient laying in bed.  Overall feels well.  Stated I have a difficult time last night going to sleep due to alarms going off.  Denies any chest pain.  States he is chronically short of breath but shortness of breath on presentation is improved since admission.  Patient with significant urine output.  Overall feeling better.   Objective: Vitals:   03/07/23 0700 03/07/23 0800 03/07/23 0838 03/07/23 0900  BP: 126/67 (!) 145/73  123/64  Pulse: 65 81  (!) 104  Resp: (!) 23 (!) 24  17  Temp:   98.2 F (36.8 C)   TempSrc:   Oral   SpO2: 96% 100%  93%  Weight:      Height:        Intake/Output Summary (Last 24  hours) at 03/07/2023 1008 Last data filed at 03/07/2023 1000 Gross per 24 hour  Intake 401.12 ml  Output 7020 ml  Net -6618.88 ml   Filed Weights   03/04/23 1047 03/05/23 0814 03/06/23 1200  Weight: (!) 306.2 kg (!) 306.2 kg (!) 325.7 kg    Examination:  General exam: NAD Respiratory system: Diminished breath sounds in the bases.  No rhonchi.  Fair air movement.  Speaking in full sentences.  No use of accessory muscles of respiration.  Cardiovascular system: RRR no murmurs rubs or gallops.  Distant heart sounds due to body habitus.  Unable to assess JVD due to body habitus.  2+ pedal edema.  Gastrointestinal system: Abdomen is obese, soft, nontender, nondistended, positive bowel sounds.  Lower abdominal pannus less edematous.  No rebound.  No guarding.  Central nervous system: Alert and oriented. No focal neurological deficits. Extremities: Bilateral lower extremities with 2+ pedal edema.  Chronic venous stasis changes bilaterally.  Decreasing erythema.  Some warmth.  Right lower extremities with some sores noted with gauze and dressing.  Symmetric 5 x  5 power. Skin: No rashes, lesions or ulcers Psychiatry: Judgement and insight appear normal. Mood & affect appropriate.     Data Reviewed: I have personally reviewed following labs and imaging studies  CBC: Recent Labs  Lab 03/04/23 1135 03/05/23 0305 03/06/23 0309 03/07/23 0320  WBC 17.5* 11.8* 9.6 8.1  NEUTROABS 15.9*  --  7.1  --   HGB 14.2 11.8* 11.6* 11.5*  HCT 46.6 38.9* 38.5* 38.6*  MCV 100.4* 99.7 102.1* 102.1*  PLT 309 228 202 211    Basic Metabolic Panel: Recent Labs  Lab 03/04/23 1135 03/05/23 0305 03/05/23 1012 03/06/23 0309 03/07/23 0320  NA 139 136  --  137 135  K 4.0 3.3*  --  3.5 3.5  CL 97* 95*  --  94* 91*  CO2 33* 34*  --  36* 36*  GLUCOSE 119* 124*  --  128* 125*  BUN 16 13  --  14 16  CREATININE 0.82 0.80  --  0.73 0.76  CALCIUM 9.0 7.9*  --  8.1* 8.1*  MG  --   --  1.8 2.0 2.1     GFR: Estimated Creatinine Clearance: 308.7 mL/min (by C-G formula based on SCr of 0.76 mg/dL).  Liver Function Tests: Recent Labs  Lab 03/04/23 1135  AST 26  ALT 38  ALKPHOS 73  BILITOT 0.8  PROT 7.9  ALBUMIN 3.4*    CBG: No results for input(s): "GLUCAP" in the last 168 hours.   Recent Results (from the past 240 hour(s))  Culture, blood (routine x 2)     Status: None (Preliminary result)   Collection Time: 03/04/23 11:35 AM   Specimen: BLOOD  Result Value Ref Range Status   Specimen Description   Final    BLOOD RIGHT ANTECUBITAL Performed at [facility], [address] [place], [place] [address]    Special Requests   Final    BOTTLES DRAWN AEROBIC AND ANAEROBIC Blood Culture adequate volume Performed at [facility], [address] [place], [place] [address]    Culture   Final    NO GROWTH 3 DAYS Performed at [facility], [address], [place], [place] [address]    Report Status PENDING  Incomplete  Culture, blood (routine x 2)     Status: None (Preliminary result)   Collection Time: 03/04/23 12:00 PM   Specimen: BLOOD  Result Value Ref Range Status   Specimen Description   Final    BLOOD LEFT ANTECUBITAL Performed at [facility], [address], [place], [place] [address]    Special Requests   Final    BOTTLES DRAWN AEROBIC AND ANAEROBIC Blood Culture adequate volume Performed at [facility], [address], [place], [place] [address]    Culture   Final    NO GROWTH 3 DAYS Performed at [facility], [address] [place], [place] [address]    Report Status PENDING  Incomplete  SARS Coronavirus 2 by RT PCR (hospital order, performed in [facility] hospital lab) *cepheid single result test* Anterior Nasal Swab     Status: None   Collection Time: 03/04/23 12:32 PM   Specimen: Anterior Nasal Swab  Result Value Ref Range Status   SARS Coronavirus 2 by RT PCR  NEGATIVE NEGATIVE Final    Comment: (NOTE) SARS-CoV-2 target nucleic acids are NOT DETECTED.  The SARS-CoV-2 RNA is generally detectable in upper and lower respiratory specimens during the acute phase of infection.  The lowest concentration of SARS-CoV-2 viral copies this assay can detect is 250 copies / mL. A negative result does not preclude SARS-CoV-2 infection and should not be used as the sole basis for treatment or other patient management decisions.  A negative result may occur with improper specimen collection / handling, submission of specimen other than nasopharyngeal swab, presence of viral mutation(s) within the areas targeted by this assay, and inadequate number of viral copies (<250 copies / mL). A negative result must be combined with clinical observations, patient history, and epidemiological information.  Fact Sheet for Patients:   RoadLapTop.co.za  Fact Sheet for Healthcare Providers: http://kim-miller.com/  This test is not yet approved or  cleared by the [place] FDA and has been authorized for detection and/or diagnosis of SARS-CoV-2 by FDA under an Emergency Use Authorization (EUA).  This EUA will remain in effect (meaning this test can be used) for the duration of the COVID-19 declaration under Section 564(b)(1) of the Act, 21 U.S.C. section 360bbb-3(b)(1), unless the authorization is terminated or revoked sooner.  Performed at [facility], [address] [place], [place] [address]   MRSA Next Gen by PCR, Nasal     Status: None   Collection Time: 03/04/23  6:48 PM   Specimen: Nasal Mucosa; Nasal Swab  Result Value Ref Range Status   MRSA by PCR Next Gen NOT DETECTED NOT DETECTED Final    Comment: (NOTE) The GeneXpert MRSA Assay (FDA approved for NASAL specimens only), is one component of a comprehensive MRSA colonization surveillance program. It is not intended to diagnose MRSA  infection nor to guide or monitor treatment for MRSA infections. Test performance is not FDA approved in patients less than 72 years old. Performed at [facility], [address] [place], [place] [address]          Radiology Studies: VAS [place] LOWER EXTREMITY VENOUS (DVT)  Result Date: 03/05/2023  Lower Venous DVT Study Patient Name:  Willie Steele  Date of Exam:   03/05/2023 Medical Rec #: [id number]      Accession #:    [id number] Date of Birth: [birth year]      Patient Gender: M Patient Age:   20 years Exam Location:  [facility] Procedure:      VAS [place] LOWER EXTREMITY VENOUS (DVT) Referring Phys: MIR [facility] --------------------------------------------------------------------------------  Indications: Swelling, and Edema.  Risk Factors: Recent extended travel obesity. Limitations: Body habitus, bandages and poor ultrasound/tissue interface. Comparison Study: No significant changes seen since prior exam 05/30/21 Performing Technologist: Shona Simpson  Examination Guidelines: A complete evaluation includes B-mode imaging, spectral Doppler, color Doppler, and power Doppler as needed of all accessible portions of each vessel. Bilateral testing is considered an integral part of a complete examination. Limited examinations for reoccurring indications may be performed as noted. The reflux portion of the exam is performed with the patient in reverse Trendelenburg.  +---------+---------------+---------+-----------+----------+-------------------+ RIGHT    CompressibilityPhasicitySpontaneityPropertiesThrombus Aging      +---------+---------------+---------+-----------+----------+-------------------+ CFV                                                   Not well visualized +---------+---------------+---------+-----------+----------+-------------------+ SFJ  Not well visualized  +---------+---------------+---------+-----------+----------+-------------------+ FV Prox  Full                                                             +---------+---------------+---------+-----------+----------+-------------------+ FV Mid   Full           Yes      Yes                                      +---------+---------------+---------+-----------+----------+-------------------+ FV Distal               Yes      Yes                  Patent by color     +---------+---------------+---------+-----------+----------+-------------------+ PFV                                                   Not well visualized +---------+---------------+---------+-----------+----------+-------------------+ POP                     Yes      Yes                  Patent by color     +---------+---------------+---------+-----------+----------+-------------------+ PTV                                                   Patent by color     +---------+---------------+---------+-----------+----------+-------------------+ PERO                                                  Not well visualized +---------+---------------+---------+-----------+----------+-------------------+   +---------+---------------+---------+-----------+----------+-------------------+ LEFT     CompressibilityPhasicitySpontaneityPropertiesThrombus Aging      +---------+---------------+---------+-----------+----------+-------------------+ CFV                                                   Not well visualized +---------+---------------+---------+-----------+----------+-------------------+ SFJ                                                   Not well visualized +---------+---------------+---------+-----------+----------+-------------------+ FV Prox  Full                                                             +---------+---------------+---------+-----------+----------+-------------------+ FV  Mid   Full  Yes      Yes                                      +---------+---------------+---------+-----------+----------+-------------------+ FV Distal               Yes      Yes                                      +---------+---------------+---------+-----------+----------+-------------------+ PFV                                                   Not well visualized +---------+---------------+---------+-----------+----------+-------------------+ POP      Full           Yes      Yes                                      +---------+---------------+---------+-----------+----------+-------------------+ PTV                                                   Not well visualized +---------+---------------+---------+-----------+----------+-------------------+ PERO                                                  Not well visualized +---------+---------------+---------+-----------+----------+-------------------+     Summary: RIGHT: - There is no evidence of deep vein thrombosis in the lower extremity.  - No cystic structure found in the popliteal fossa.  LEFT: - There is no evidence of deep vein thrombosis in the lower extremity.  - No cystic structure found in the popliteal fossa.  *See table(s) above for measurements and observations. Electronically signed by Carolynn Sayers on 03/05/2023 at 4:06:06 PM.    Final    ECHOCARDIOGRAM COMPLETE  Result Date: 03/05/2023    ECHOCARDIOGRAM REPORT   Patient Name:   Willie Steele Date of Exam: 03/05/2023 Medical Rec #:  [id number]     Height:       74.0 in Accession #:    [id number]    Weight:       675.0 lb Date of Birth:  11/20/1980     BSA:          3.645 m Patient Age:    42 years      BP:           142/64 mmHg Patient Gender: M             HR:           89 bpm. Exam Location:  Inpatient Procedure: 2D Echo, Cardiac Doppler and Color Doppler Indications:    CHF- Acute Diastolic I50.31  History:        Patient has prior history  of Echocardiogram examinations, most                 recent 05/30/2021.  CHF and Cardiomegaly; Risk                 Factors:Hypertension and Sleep Apnea.  Sonographer:    Lucendia Herrlich RCS Referring Phys: [phone] Lamondre Wesche V Blima Jaimes  Sonographer Comments: Suboptimal parasternal window, suboptimal subcostal window, no apical window and patient is obese. TDS due to morbid obesity, over 600 pounds. IMPRESSIONS  1. Cannot assess LVF or wall motion due to inability to visualize endocardial segments from poor acoustical windows. Left ventricular endocardial border not optimally defined to evaluate regional wall motion. Left ventricular diastolic function could not be evaluated.  2. Right ventricular systolic function was not well visualized. The right ventricular size is not well visualized. There is normal pulmonary artery systolic pressure. The estimated right ventricular systolic pressure is 26.2 mmHg.  3. The mitral valve is normal in structure. No evidence of mitral valve regurgitation. No evidence of mitral stenosis.  4. The aortic valve was not well visualized. Aortic valve regurgitation is not visualized.  5. The inferior vena cava is normal in size with greater than 50% respiratory variability, suggesting right atrial pressure of 3 mmHg. FINDINGS  Left Ventricle: Left ventricular ejection fraction, by estimation, is Cannot assess due to inability to visualize endocardial segments from poor acoustical windows%. The left ventricle has Cannot assess function. Left ventricular endocardial border not optimally defined to evaluate regional wall motion. The left ventricular internal cavity size was cannot assess. There is no left ventricular hypertrophy. Left ventricular diastolic function could not be evaluated. Right Ventricle: The right ventricular size is not well visualized. Right vetricular wall thickness was not well visualized. Right ventricular systolic function was not well visualized. There is normal pulmonary  artery systolic pressure. The tricuspid regurgitant velocity is 1.67 m/s, and with an assumed right atrial pressure of 15 mmHg, the estimated right ventricular systolic pressure is 26.2 mmHg. Left Atrium: Left atrial size was not well visualized. Right Atrium: Right atrial size was not well visualized. Pericardium: There is no evidence of pericardial effusion. Mitral Valve: The mitral valve is normal in structure. No evidence of mitral valve regurgitation. No evidence of mitral valve stenosis. Tricuspid Valve: The tricuspid valve is not well visualized. Tricuspid valve regurgitation is trivial. No evidence of tricuspid stenosis. Aortic Valve: The aortic valve was not well visualized. Aortic valve regurgitation is not visualized. Pulmonic Valve: The pulmonic valve was not well visualized. Pulmonic valve regurgitation is not visualized. No evidence of pulmonic stenosis. Aorta: The aortic root is normal in size and structure. Venous: The inferior vena cava is normal in size with greater than 50% respiratory variability, suggesting right atrial pressure of 3 mmHg. IAS/Shunts: No atrial level shunt detected by color flow Doppler.  IVC IVC diam: 3.40 cm  AORTA Ao Asc diam: 3.65 cm TRICUSPID VALVE TR Peak grad:   11.2 mmHg TR Vmax:        167.00 cm/s Armanda Magic MD Electronically signed by Armanda Magic MD Signature Date/Time: 03/05/2023/1:03:11 PM    Final         Scheduled Meds:  Chlorhexidine Gluconate Cloth  6 each Topical Daily   furosemide  60 mg Intravenous BID   heparin  5,000 Units Subcutaneous Q8H   linezolid  600 mg Oral Q12H   losartan  100 mg Oral Daily   potassium chloride  40 mEq Oral Daily   Continuous Infusions:     LOS: 3 days    Time spent: 40 mins    Ramiro Harvest, MD [facility]  To contact the attending provider between 7A-7P or the covering provider during after hours 7P-7A, please log into the web site [email or web address] and access using universal [place]  password for that web site. If you do not have the password, please call the hospital operator.  03/07/2023, 10:08 AM

## 2023-03-08 DIAGNOSIS — I10 Essential (primary) hypertension: Secondary | ICD-10-CM | POA: Diagnosis not present

## 2023-03-08 DIAGNOSIS — I5031 Acute diastolic (congestive) heart failure: Secondary | ICD-10-CM | POA: Diagnosis not present

## 2023-03-08 DIAGNOSIS — G4733 Obstructive sleep apnea (adult) (pediatric): Secondary | ICD-10-CM | POA: Diagnosis not present

## 2023-03-08 DIAGNOSIS — L0390 Cellulitis, unspecified: Secondary | ICD-10-CM | POA: Diagnosis not present

## 2023-03-08 LAB — BASIC METABOLIC PANEL
Anion gap: 9 (ref 5–15)
BUN: 17 mg/dL (ref 6–20)
CO2: 36 mmol/L — ABNORMAL HIGH (ref 22–32)
Calcium: 8.5 mg/dL — ABNORMAL LOW (ref 8.9–10.3)
Chloride: 90 mmol/L — ABNORMAL LOW (ref 98–111)
Creatinine, Ser: 0.62 mg/dL (ref 0.61–1.24)
GFR, Estimated: >60 mL/min (ref >60)
Glucose, Bld: 120 mg/dL — ABNORMAL HIGH (ref 70–99)
Potassium: 3.6 mmol/L (ref 3.5–5.1)
Sodium: 135 mmol/L (ref 135–145)

## 2023-03-08 LAB — CBC
HCT: 40.4 % (ref 39.0–52.0)
Hemoglobin: 11.7 g/dL — ABNORMAL LOW (ref 13.0–17.0)
MCH: 29.8 pg (ref 26.0–34.0)
MCHC: 29 g/dL — ABNORMAL LOW (ref 30.0–36.0)
MCV: 103.1 fL — ABNORMAL HIGH (ref 80.0–100.0)
Platelets: 203 10*3/uL (ref 150–400)
RBC: 3.92 MIL/uL — ABNORMAL LOW (ref 4.22–5.81)
RDW: 15.8 % — ABNORMAL HIGH (ref 11.5–15.5)
WBC: 6.9 10*3/uL (ref 4.0–10.5)
nRBC: 0 % (ref 0.0–0.2)

## 2023-03-08 LAB — MAGNESIUM: Magnesium: 2.1 mg/dL (ref 1.7–2.4)

## 2023-03-08 NOTE — Plan of Care (Signed)
Alert and oriented. Able to ambulate in room with assistance. Transferred from bed to chair.  Diuresing continues with IV lasix.  Checked oxygen level while on room air, maintains oxygen saturation above 88%-94% while on room air.   Problem: Education: Goal: Knowledge of General Education information will improve Description: Including pain rating scale, medication(s)/side effects and non-pharmacologic comfort measures Outcome: Progressing   Problem: Health Behavior/Discharge Planning: Goal: Ability to manage health-related needs will improve Outcome: Progressing   Problem: Clinical Measurements: Goal: Ability to maintain clinical measurements within normal limits will improve Outcome: Progressing Goal: Will remain free from infection Outcome: Progressing Goal: Diagnostic test results will improve Outcome: Progressing

## 2023-03-08 NOTE — Progress Notes (Signed)
PROGRESS NOTE    Willie Steele  EPP:[id number] DOB: 16-Apr-1980 DOA: 03/04/2023 PCP: Tracey Harries, MD    Chief Complaint  Patient presents with   Leg Pain    Brief Narrative:  Patient is a 42 year old gentleman history of super morbid obesity, hypertension, diastolic CHF admitted to the hospital with sepsis felt secondary to cellulitis and acute CHF exacerbation.  Patient placed on empiric antibiotics, IV Lasix.   Assessment & Plan:   Principal Problem:   Sepsis due to cellulitis [facility]) Active Problems:   Acute respiratory failure with hypoxia (HCC)   Hypertension, benign   Morbid obesity (HCC)   OSA on CPAP   Acute diastolic congestive heart failure (HCC)   Hypokalemia  #1 sepsis due to cellulitis, POA -Patient with admission met criteria for sepsis with tachycardia, reported fevers, leukocytosis, lower extremity concerning for cellulitis. -Patient states usually gets cellulitis about once a year. -Patient currently hemodynamically stable, normal lactate, no evidence of endorgan dysfunction. -Blood cultures pending without no growth to date.. -Leukocytosis trending down.  Afebrile. -Blood cultures with no growth to date x 3 days. -IV Zosyn discontinued on 03/06/2023. -IV linezolid changed to oral linezolid and will treat empirically for 7 to 10 days. -Continue linezolid. -Supportive care.  2.  Acute hypoxic respiratory failure -Patient noted to have presented with acute hypoxic respiratory failure noted per patient on presentation to have sats in the 80% on room air on presentation not on home O2. -Patient noted to have a baseline sats ranging anywhere from 89 to 93% in review of office records on epic. -Etiology likely multifactorial secondary to obesity hypoventilation syndrome, OSA, acute on chronic diastolic CHF exacerbation. -Patient denies any overt chest pain.  Patient unlikely to have a PE as denied any pleurisy. -Due to body habitus and weight unable to get a CT  angiogram chest or V/Q study. -If D-dimer is checked will likely be elevated in the setting of sepsis and obesity. -Lower extremity Dopplers negative. -Patient states had significant urine output on diuresis with clinical improvement with shortness of breath however still requiring O2. -Noted to have required 5 L O2 being weaned down currently on room air with sats of 89 to 92%.  -Continue IV Lasix, strict I's and O's, daily weights. -Once patient is adequately diuresed and transitioned to oral Demadex may need to go home on an increased dose of Demadex at 40 mg daily with close outpatient follow-up with PCP. -Wean O2 as able to. -Continue CPAP nightly. -Ambulatory referral to pulmonary for OSA and ambulatory referral to cardiology for CHF placed.   3.  Acute on chronic diastolic CHF exacerbation -Last 2D echo 05/2021 nondiagnostic due to very poor echo windows and visualization of cardiac structures and unable to visualize LV endocardium however EF appeared to be within normal range of 55 to 65%.  Right ventricular systolic function was normal. -Repeat 2D echo unable to assess LVEF or wall motion due to inability to visualize endocardial segments from poor acoustical windows.  Unable to optimally evaluate regional wall motion.  Left ventricular diastolic function cannot be evaluated. -Patient denied any chest pain. -Patient states on torsemide 20 mg daily in addition to Cozaar. -Patient currently on Lasix 60 mg IV every 12 hours with urine output of 8.4 L over the past 24 hours.   -Patient is -15.7 L during this hospitalization.   -Per admitting physician patient stated had a dry weight of 650 pounds.  Patient currently 718 pounds.(03/06/2023). -Unable to rely on accuracy of weights. -  Continue home regimen Cozaar. -Continue IV diuretics through today and could likely transition to oral Demadex 40 mg daily tomorrow if continued clinical improvement. -- Strict I's and O's, daily weights.  -Will  need outpatient follow-up with cardiology. -Ambulatory referral sent to cardiology.  4.  Hypokalemia -Secondary to diuresis. -Continue K. Dur 40 mEq daily.  5.  Obesity hypoventilation/OSA -CPAP nightly.  6.  Hypertension -Continue IV Lasix.   -Continue Cozaar.   7.  Super morbid obesity -BMI 86.66 kg/m. -Lifestyle modification. -Outpatient follow-up with PCP.  .    DVT prophylaxis: Heparin Code Status: Full Family Communication: Updated patient.  No family at bedside. Disposition: Likely home when clinically improved with adequate diuresis hopefully in the next 24 to 48 hours.  Status is: Inpatient Remains inpatient appropriate because: Severity of illness   Consultants:  None  Procedures: Chest x-ray 03/04/2023 Plan forms of the left tib-fib 03/04/2023 2D echo 03/05/2023 Lower extremity Dopplers 03/05/2023  Antimicrobials:  Anti-infectives (From admission, onward)    Start     Dose/Rate Route Frequency Ordered Stop   03/06/23 2200  linezolid (ZYVOX) tablet 600 mg        600 mg Oral Every 12 hours 03/06/23 1036     03/04/23 1800  piperacillin-tazobactam (ZOSYN) IVPB 3.375 g  Status:  Discontinued        3.375 g 12.5 mL/hr over 240 Minutes Intravenous Every 8 hours 03/04/23 1338 03/06/23 1036   03/04/23 1200  linezolid (ZYVOX) IVPB 600 mg  Status:  Discontinued        600 mg 300 mL/hr over 60 Minutes Intravenous Every 12 hours 03/04/23 1157 03/06/23 1036   03/04/23 1200  piperacillin-tazobactam (ZOSYN) IVPB 3.375 g        3.375 g 100 mL/hr over 30 Minutes Intravenous  Once 03/04/23 1157 03/04/23 1258         Subjective: Laying in bed.  Feeling much better.  Feels he is close to his baseline.  Denies any chest pain, no worsening shortness of breath.  No abdominal pain.  Tolerating oral intake.  Wants to be able to ambulate and mobilize again today with therapy.    Objective: Vitals:   03/08/23 0830 03/08/23 0832 03/08/23 0900 03/08/23 0934  BP:   (!)  129/57   Pulse: 86 85 (!) 102 81  Resp: 18 17 (!) 23 (!) 22  Temp:      TempSrc:      SpO2: (!) 87% 91% 92% 92%  Weight:      Height:        Intake/Output Summary (Last 24 hours) at 03/08/2023 1030 Last data filed at 03/08/2023 0934 Gross per 24 hour  Intake 620 ml  Output 6750 ml  Net -6130 ml   Filed Weights   03/05/23 0814 03/06/23 1200  Weight: (!) 306.2 kg (!) 325.7 kg    Examination:  General exam: NAD Respiratory system: Decreased breath sounds in the bases otherwise clear.  No rhonchi.  Fair air movement.  Speaking in full sentences.  No use of accessory muscles of respiration.  Cardiovascular system: Regular rate rhythm no murmurs rubs or gallops.  Distant heart sounds secondary to body habitus.  Unable to assess JVD due to body habitus.  1+ pedal edema. Gastrointestinal system: Abdomen is obese, soft, nontender, nondistended, positive bowel sounds.  Lower abdominal pannus less edematous.  No rebound.  No guarding.  Central nervous system: Alert and oriented. No focal neurological deficits. Extremities: Bilateral lower extremities with 1+ pedal edema.  Chronic venous stasis changes bilaterally.  Decreasing erythema.  Some warmth.  Right lower extremities with some sores noted with gauze and dressing.  Symmetric 5 x 5 power. Skin: No rashes, lesions or ulcers Psychiatry: Judgement and insight appear normal. Mood & affect appropriate.     Data Reviewed: I have personally reviewed following labs and imaging studies  CBC: Recent Labs  Lab 03/04/23 1135 03/05/23 0305 03/06/23 0309 03/07/23 0320 03/08/23 0300  WBC 17.5* 11.8* 9.6 8.1 6.9  NEUTROABS 15.9*  --  7.1  --   --   HGB 14.2 11.8* 11.6* 11.5* 11.7*  HCT 46.6 38.9* 38.5* 38.6* 40.4  MCV 100.4* 99.7 102.1* 102.1* 103.1*  PLT 309 228 202 211 203    Basic Metabolic Panel: Recent Labs  Lab 03/04/23 1135 03/05/23 0305 03/05/23 1012 03/06/23 0309 03/07/23 0320 03/08/23 0300  NA 139 136  --  137 135  135  K 4.0 3.3*  --  3.5 3.5 3.6  CL 97* 95*  --  94* 91* 90*  CO2 33* 34*  --  36* 36* 36*  GLUCOSE 119* 124*  --  128* 125* 120*  BUN 16 13  --  14 16 17   CREATININE 0.82 0.80  --  0.73 0.76 0.62  CALCIUM 9.0 7.9*  --  8.1* 8.1* 8.5*  MG  --   --  1.8 2.0 2.1 2.1    GFR: Estimated Creatinine Clearance: 308.7 mL/min (by C-G formula based on SCr of 0.62 mg/dL).  Liver Function Tests: Recent Labs  Lab 03/04/23 1135  AST 26  ALT 38  ALKPHOS 73  BILITOT 0.8  PROT 7.9  ALBUMIN 3.4*    CBG: No results for input(s): "GLUCAP" in the last 168 hours.   Recent Results (from the past 240 hour(s))  Culture, blood (routine x 2)     Status: None (Preliminary result)   Collection Time: 03/04/23 11:35 AM   Specimen: BLOOD  Result Value Ref Range Status   Specimen Description   Final    BLOOD RIGHT ANTECUBITAL Performed at [facility], [address] [place], [place] [address]    Special Requests   Final    BOTTLES DRAWN AEROBIC AND ANAEROBIC Blood Culture adequate volume Performed at [facility], [address], [place], [place] [address]    Culture   Final    NO GROWTH 4 DAYS Performed at [facility], [address] [place], [place] [address]    Report Status PENDING  Incomplete  Culture, blood (routine x 2)     Status: None (Preliminary result)   Collection Time: 03/04/23 12:00 PM   Specimen: BLOOD  Result Value Ref Range Status   Specimen Description   Final    BLOOD LEFT ANTECUBITAL Performed at [facility], [address], [place], [place] [address]    Special Requests   Final    BOTTLES DRAWN AEROBIC AND ANAEROBIC Blood Culture adequate volume Performed at [facility], [address] [place], [place] [address]    Culture   Final    NO GROWTH 4 DAYS Performed at [facility], [address] [place], [place] [address]    Report Status PENDING  Incomplete  SARS  Coronavirus 2 by RT PCR (hospital order, performed in [facility] hospital lab) *cepheid single result test* Anterior Nasal Swab     Status: None   Collection Time: 03/04/23 12:32 PM   Specimen: Anterior Nasal Swab  Result Value Ref Range Status   SARS Coronavirus 2 by RT PCR NEGATIVE NEGATIVE Final    Comment: (NOTE) SARS-CoV-2 target nucleic acids are NOT DETECTED.  The SARS-CoV-2 RNA is generally detectable in upper and lower respiratory specimens during the acute phase of infection. The lowest concentration of SARS-CoV-2 viral copies this assay can detect is 250 copies / mL. A negative result does not preclude SARS-CoV-2 infection and should not be used as the sole basis for treatment or other patient management decisions.  A negative result may occur with improper specimen collection / handling, submission of specimen other than nasopharyngeal swab, presence of viral mutation(s) within the areas targeted by this assay, and inadequate number of viral copies (<250 copies / mL). A negative result must be combined with clinical observations, patient history, and epidemiological information.  Fact Sheet for Patients:   RoadLapTop.co.za  Fact Sheet for Healthcare Providers: http://kim-miller.com/  This test is not yet approved or  cleared by the [place] FDA and has been authorized for detection and/or diagnosis of SARS-CoV-2 by FDA under an Emergency Use Authorization (EUA).  This EUA will remain in effect (meaning this test can be used) for the duration of the COVID-19 declaration under Section 564(b)(1) of the Act, 21 U.S.C. section 360bbb-3(b)(1), unless the authorization is terminated or revoked sooner.  Performed at [facility], [address], [place], [place] [address]   MRSA Next Gen by PCR, Nasal     Status: None   Collection Time: 03/04/23  6:48 PM   Specimen: Nasal Mucosa; Nasal Swab  Result Value  Ref Range Status   MRSA by PCR Next Gen NOT DETECTED NOT DETECTED Final    Comment: (NOTE) The GeneXpert MRSA Assay (FDA approved for NASAL specimens only), is one component of a comprehensive MRSA colonization surveillance program. It is not intended to diagnose MRSA infection nor to guide or monitor treatment for MRSA infections. Test performance is not FDA approved in patients less than 22 years old. Performed at [facility], [address] [place], [place] [address]          Radiology Studies: No results found.      Scheduled Meds:  Chlorhexidine Gluconate Cloth  6 each Topical Daily   furosemide  60 mg Intravenous BID   heparin  5,000 Units Subcutaneous Q8H   linezolid  600 mg Oral Q12H   losartan  100 mg Oral Daily   potassium chloride  40 mEq Oral Daily   Continuous Infusions:     LOS: 4 days    Time spent: 40 mins    Ramiro Harvest, MD [facility]   To contact the attending provider between 7A-7P or the covering provider during after hours 7P-7A, please log into the web site [email or web address] and access using universal [place] password for that web site. If you do not have the password, please call the hospital operator.  03/08/2023, 10:30 AM

## 2023-03-09 DIAGNOSIS — G4733 Obstructive sleep apnea (adult) (pediatric): Secondary | ICD-10-CM | POA: Diagnosis not present

## 2023-03-09 DIAGNOSIS — I10 Essential (primary) hypertension: Secondary | ICD-10-CM | POA: Diagnosis not present

## 2023-03-09 DIAGNOSIS — I5031 Acute diastolic (congestive) heart failure: Secondary | ICD-10-CM | POA: Diagnosis not present

## 2023-03-09 DIAGNOSIS — L0390 Cellulitis, unspecified: Secondary | ICD-10-CM | POA: Diagnosis not present

## 2023-03-09 LAB — BASIC METABOLIC PANEL
Anion gap: 8 (ref 5–15)
BUN: 18 mg/dL (ref 6–20)
CO2: 36 mmol/L — ABNORMAL HIGH (ref 22–32)
Calcium: 8.7 mg/dL — ABNORMAL LOW (ref 8.9–10.3)
Chloride: 92 mmol/L — ABNORMAL LOW (ref 98–111)
Creatinine, Ser: 0.73 mg/dL (ref 0.61–1.24)
GFR, Estimated: >60 mL/min (ref >60)
Glucose, Bld: 126 mg/dL — ABNORMAL HIGH (ref 70–99)
Potassium: 3.7 mmol/L (ref 3.5–5.1)
Sodium: 136 mmol/L (ref 135–145)

## 2023-03-09 LAB — CBC
HCT: 38.8 % — ABNORMAL LOW (ref 39.0–52.0)
Hemoglobin: 12 g/dL — ABNORMAL LOW (ref 13.0–17.0)
MCH: 30.8 pg (ref 26.0–34.0)
MCHC: 30.9 g/dL (ref 30.0–36.0)
MCV: 99.7 fL (ref 80.0–100.0)
Platelets: 214 10*3/uL (ref 150–400)
RBC: 3.89 MIL/uL — ABNORMAL LOW (ref 4.22–5.81)
RDW: 15.9 % — ABNORMAL HIGH (ref 11.5–15.5)
WBC: 7.7 10*3/uL (ref 4.0–10.5)
nRBC: 0 % (ref 0.0–0.2)

## 2023-03-09 LAB — CULTURE, BLOOD (ROUTINE X 2)
Culture: NO GROWTH
Culture: NO GROWTH
Special Requests: ADEQUATE
Special Requests: ADEQUATE

## 2023-03-09 MED ORDER — FUROSEMIDE 10 MG/ML IJ SOLN
80.0000 mg | Freq: Two times a day (BID) | INTRAMUSCULAR | Status: DC
  Administered 2023-03-09 – 2023-03-10 (×2): 80 mg via INTRAVENOUS
  Filled 2023-03-09 (×2): qty 8

## 2023-03-09 NOTE — Progress Notes (Signed)
PROGRESS NOTE    Willie Steele  ZOX:[id number] DOB: 05-29-1980 DOA: 03/04/2023 PCP: Tracey Harries, MD    Chief Complaint  Patient presents with   Leg Pain    Brief Narrative:  Patient is a 42 year old gentleman history of super morbid obesity, hypertension, diastolic CHF admitted to the hospital with sepsis felt secondary to cellulitis and acute CHF exacerbation.  Patient placed on empiric antibiotics, IV Lasix.   Assessment & Plan:   Principal Problem:   Sepsis due to cellulitis [facility]) Active Problems:   Acute respiratory failure with hypoxia (HCC)   Hypertension, benign   Morbid obesity (HCC)   OSA on CPAP   Acute diastolic congestive heart failure (HCC)   Hypokalemia  #1 sepsis due to cellulitis, POA -Patient with admission met criteria for sepsis with tachycardia, reported fevers, leukocytosis, lower extremity concerning for cellulitis. -Patient states usually gets cellulitis about once a year. -Patient currently hemodynamically stable, normal lactate, no evidence of endorgan dysfunction. -Blood cultures pending without no growth to date.. -Leukocytosis trending down.  Afebrile. -Blood cultures with no growth to date x 3 days. -IV Zosyn discontinued on 03/06/2023. -IV linezolid changed to oral linezolid and will treat empirically for 7 to 10 days. -Continue linezolid. -Supportive care.  2.  Acute hypoxic respiratory failure -Patient noted to have presented with acute hypoxic respiratory failure noted per patient on presentation to have sats in the 80% on room air on presentation not on home O2. -Patient noted to have a baseline sats ranging anywhere from 89 to 93% in review of office records on epic. -Etiology likely multifactorial secondary to obesity hypoventilation syndrome, OSA, acute on chronic diastolic CHF exacerbation. -Patient denies any overt chest pain.  Patient unlikely to have a PE as denied any pleurisy. -Due to body habitus and weight unable to get a CT  angiogram chest or V/Q study. -If D-dimer is checked will likely be elevated in the setting of sepsis and obesity. -Lower extremity Dopplers negative. -Patient states had significant urine output on diuresis with clinical improvement with shortness of breath however still requiring O2. -Noted to have required 5 L O2 being weaned down currently on room air with sats of 89 to 92%.  -Continue IV Lasix, strict I's and O's, daily weights. -Once patient is adequately diuresed and transitioned to oral Demadex may need to go home on an increased dose of Demadex at 40 mg daily with close outpatient follow-up with PCP. -Wean O2 as able to. -Continue CPAP nightly. -Ambulatory referral to pulmonary for OSA and ambulatory referral to cardiology for CHF placed.   3.  Acute on chronic diastolic CHF exacerbation -Last 2D echo 05/2021 nondiagnostic due to very poor echo windows and visualization of cardiac structures and unable to visualize LV endocardium however EF appeared to be within normal range of 55 to 65%.  Right ventricular systolic function was normal. -Repeat 2D echo unable to assess LVEF or wall motion due to inability to visualize endocardial segments from poor acoustical windows.  Unable to optimally evaluate regional wall motion.  Left ventricular diastolic function cannot be evaluated. -Patient denied any chest pain. -Patient states on torsemide 20 mg daily in addition to Cozaar. -Patient currently on Lasix 60 mg IV every 12 hours with urine output of 4.4 L over the past 24 hours.   -Patient is -21.6  L during this hospitalization.   -Per admitting physician patient stated had a dry weight of 650 pounds.  Patient currently 636.03 pounds since admission. -Weight on admission noted  at 675 pounds. -Unable to rely on accuracy of weights. -Continue home regimen Cozaar. -Continue IV diuretics through today and could likely transition to oral Demadex 40 mg daily tomorrow if continued clinical  improvement. -- Strict I's and O's, daily weights.  -Will need outpatient follow-up with cardiology. -Ambulatory referral sent to cardiology.  4.  Hypokalemia -Secondary to diuresis. -Continue K-Dur 40 mEq daily.   5.  Obesity hypoventilation/OSA -CPAP nightly.  6.  Hypertension -Continue IV Lasix.   -Continue Cozaar.   7.  Super morbid obesity -BMI 86.66 kg/m. -Lifestyle modification. -Outpatient follow-up with PCP.  .    DVT prophylaxis: Heparin Code Status: Full Family Communication: Updated patient.  No family at bedside. Disposition: Transfer to progressive care floor.  Likely home when clinically improved with adequate diuresis hopefully in the next 24 to 48 hours.  Status is: Inpatient Remains inpatient appropriate because: Severity of illness   Consultants:  None  Procedures: Chest x-ray 03/04/2023 Plan forms of the left tib-fib 03/04/2023 2D echo 03/05/2023 Lower extremity Dopplers 03/05/2023  Antimicrobials:  Anti-infectives (From admission, onward)    Start     Dose/Rate Route Frequency Ordered Stop   03/06/23 2200  linezolid (ZYVOX) tablet 600 mg        600 mg Oral Every 12 hours 03/06/23 1036 03/14/23 0959   03/04/23 1800  piperacillin-tazobactam (ZOSYN) IVPB 3.375 g  Status:  Discontinued        3.375 g 12.5 mL/hr over 240 Minutes Intravenous Every 8 hours 03/04/23 1338 03/06/23 1036   03/04/23 1200  linezolid (ZYVOX) IVPB 600 mg  Status:  Discontinued        600 mg 300 mL/hr over 60 Minutes Intravenous Every 12 hours 03/04/23 1157 03/06/23 1036   03/04/23 1200  piperacillin-tazobactam (ZOSYN) IVPB 3.375 g        3.375 g 100 mL/hr over 30 Minutes Intravenous  Once 03/04/23 1157 03/04/23 1258         Subjective: Laying in bed.  States every day he feels his breathing is improving.  Denies any chest pain or shortness of breath.  Feels he is getting close to his baseline.  Tolerating oral intake.  Feels his O2 sats is improving daily with  diuresis.   Objective: Vitals:   03/09/23 0600 03/09/23 0700 03/09/23 0800 03/09/23 0900  BP: (!) 147/63 (!) 160/73 (!) 161/74 (!) 176/61  Pulse: 74 73 72 88  Resp: (!) 27 20 (!) 24 16  Temp:   98.5 F (36.9 C)   TempSrc:   Oral   SpO2: 92% (!) 87% 91% 94%  Weight:      Height:        Intake/Output Summary (Last 24 hours) at 03/09/2023 0941 Last data filed at 03/09/2023 0410 Gross per 24 hour  Intake --  Output 3401 ml  Net -3401 ml   Filed Weights   03/05/23 0814 03/06/23 1200  Weight: (!) 306.2 kg (!) 325.7 kg    Examination:  General exam: NAD Respiratory system: Some decreased breath sounds in the bases.  No rhonchi.  Fair air movement.  Speaking in full sentences.  No use of accessory muscles of respiration.  Cardiovascular system: RRR no murmurs rubs or gallops.  Distant heart sounds secondary to body habitus.  Unable to assess JVD due to body habitus.  1+ pedal edema.  Gastrointestinal system: Abdomen is obese, soft, nontender, nondistended, positive bowel sounds.  Lower abdominal pannus less edematous.  No rebound.  No guarding.  Central nervous  system: Alert and oriented. No focal neurological deficits. Extremities: Bilateral lower extremities with 1+ pedal edema.  Chronic venous stasis changes bilaterally.  Decreasing erythema.  Some warmth.  Right lower extremities with some sores noted with gauze and dressing.  Symmetric 5 x 5 power. Skin: No rashes, lesions or ulcers Psychiatry: Judgement and insight appear normal. Mood & affect appropriate.     Data Reviewed: I have personally reviewed following labs and imaging studies  CBC: Recent Labs  Lab 03/04/23 1135 03/05/23 0305 03/06/23 0309 03/07/23 0320 03/08/23 0300 03/09/23 0305  WBC 17.5* 11.8* 9.6 8.1 6.9 7.7  NEUTROABS 15.9*  --  7.1  --   --   --   HGB 14.2 11.8* 11.6* 11.5* 11.7* 12.0*  HCT 46.6 38.9* 38.5* 38.6* 40.4 38.8*  MCV 100.4* 99.7 102.1* 102.1* 103.1* 99.7  PLT 309 228 202 211 203 214     Basic Metabolic Panel: Recent Labs  Lab 03/05/23 0305 03/05/23 1012 03/06/23 0309 03/07/23 0320 03/08/23 0300 03/09/23 0305  NA 136  --  137 135 135 136  K 3.3*  --  3.5 3.5 3.6 3.7  CL 95*  --  94* 91* 90* 92*  CO2 34*  --  36* 36* 36* 36*  GLUCOSE 124*  --  128* 125* 120* 126*  BUN 13  --  14 16 17 18   CREATININE 0.80  --  0.73 0.76 0.62 0.73  CALCIUM 7.9*  --  8.1* 8.1* 8.5* 8.7*  MG  --  1.8 2.0 2.1 2.1  --     GFR: Estimated Creatinine Clearance: 308.7 mL/min (by C-G formula based on SCr of 0.73 mg/dL).  Liver Function Tests: Recent Labs  Lab 03/04/23 1135  AST 26  ALT 38  ALKPHOS 73  BILITOT 0.8  PROT 7.9  ALBUMIN 3.4*    CBG: No results for input(s): "GLUCAP" in the last 168 hours.   Recent Results (from the past 240 hour(s))  Culture, blood (routine x 2)     Status: None   Collection Time: 03/04/23 11:35 AM   Specimen: BLOOD  Result Value Ref Range Status   Specimen Description   Final    BLOOD RIGHT ANTECUBITAL Performed at [facility], [address] [place], [place] [address]    Special Requests   Final    BOTTLES DRAWN AEROBIC AND ANAEROBIC Blood Culture adequate volume Performed at [facility], [address], [place], [place] [address]    Culture   Final    NO GROWTH 5 DAYS Performed at [facility], [address] [place], [place] [address]    Report Status 03/09/2023 FINAL  Final  Culture, blood (routine x 2)     Status: None   Collection Time: 03/04/23 12:00 PM   Specimen: BLOOD  Result Value Ref Range Status   Specimen Description   Final    BLOOD LEFT ANTECUBITAL Performed at [facility], [address] [place], [place] [address]    Special Requests   Final    BOTTLES DRAWN AEROBIC AND ANAEROBIC Blood Culture adequate volume Performed at [facility], [address], [place], [place] [address]    Culture   Final    NO GROWTH 5  DAYS Performed at [facility], [address], [place], [place] [address]    Report Status 03/09/2023 FINAL  Final  SARS Coronavirus 2 by RT PCR (hospital order, performed in [facility] hospital lab) *cepheid  single result test* Anterior Nasal Swab     Status: None   Collection Time: 03/04/23 12:32 PM   Specimen: Anterior Nasal Swab  Result Value Ref Range Status   SARS Coronavirus 2 by RT PCR NEGATIVE NEGATIVE Final    Comment: (NOTE) SARS-CoV-2 target nucleic acids are NOT DETECTED.  The SARS-CoV-2 RNA is generally detectable in upper and lower respiratory specimens during the acute phase of infection. The lowest concentration of SARS-CoV-2 viral copies this assay can detect is 250 copies / mL. A negative result does not preclude SARS-CoV-2 infection and should not be used as the sole basis for treatment or other patient management decisions.  A negative result may occur with improper specimen collection / handling, submission of specimen other than nasopharyngeal swab, presence of viral mutation(s) within the areas targeted by this assay, and inadequate number of viral copies (<250 copies / mL). A negative result must be combined with clinical observations, patient history, and epidemiological information.  Fact Sheet for Patients:   RoadLapTop.co.za  Fact Sheet for Healthcare Providers: http://kim-miller.com/  This test is not yet approved or  cleared by the [place] FDA and has been authorized for detection and/or diagnosis of SARS-CoV-2 by FDA under an Emergency Use Authorization (EUA).  This EUA will remain in effect (meaning this test can be used) for the duration of the COVID-19 declaration under Section 564(b)(1) of the Act, 21 U.S.C. section 360bbb-3(b)(1), unless the authorization is terminated or revoked sooner.  Performed at [facility], [address], [place], [place] [address]   MRSA  Next Gen by PCR, Nasal     Status: None   Collection Time: 03/04/23  6:48 PM   Specimen: Nasal Mucosa; Nasal Swab  Result Value Ref Range Status   MRSA by PCR Next Gen NOT DETECTED NOT DETECTED Final    Comment: (NOTE) The GeneXpert MRSA Assay (FDA approved for NASAL specimens only), is one component of a comprehensive MRSA colonization surveillance program. It is not intended to diagnose MRSA infection nor to guide or monitor treatment for MRSA infections. Test performance is not FDA approved in patients less than 69 years old. Performed at [facility], [address] [place], [place] [address]          Radiology Studies: No results found.      Scheduled Meds:  Chlorhexidine Gluconate Cloth  6 each Topical Daily   furosemide  60 mg Intravenous BID   heparin  5,000 Units Subcutaneous Q8H   linezolid  600 mg Oral Q12H   losartan  100 mg Oral Daily   potassium chloride  40 mEq Oral Daily   Continuous Infusions:     LOS: 5 days    Time spent: 40 mins    Ramiro Harvest, MD [facility]   To contact the attending provider between 7A-7P or the covering provider during after hours 7P-7A, please log into the web site [email or web address] and access using universal [place] password for that web site. If you do not have the password, please call the hospital operator.  03/09/2023, 9:41 AM

## 2023-03-09 NOTE — Progress Notes (Signed)
   03/09/23 2126  BiPAP/CPAP/SIPAP  BiPAP/CPAP/SIPAP Pt Type Adult (pt prefers/comfortable with self-placement)  BiPAP/CPAP/SIPAP DREAMSTATIOND  Mask Type Full face mask  Mask Size Large  Flow Rate 5 lpm  Patient Home Equipment No  Auto Titrate Yes (pt comfortable on previous night's settings, automode, min15cm, max25cm h2o)   RN aware.

## 2023-03-09 NOTE — Progress Notes (Signed)
Physical Therapy Treatment Patient Details Name: Willie Steele MRN: [id number] DOB: 21-Nov-1980 Today's Date: 03/09/2023   History of Present Illness Patient is a 42 year old male who presented with fever and possible cellulitis in LLE with current treatment ongoing for RLE. Patient was admitted with sepsis secondary to cellulitis, acute hypoxic respiratory failure. PMH: acute on chronic CHF, hypokalemia, HTN, obesity hypoventilation, OSA, super morbid obesity,    PT Comments  Pt was seen in bed and was motivated to walk with PT. Pt performed supine to sit mod independently with HOB raised, noted RA decreased to 84%. Pt performed sit to stand modified independently with bed elevated. Pt ambulated 10' to hallway and sat in recliner, was on 2L, but increased to 4L due to sats dropping <90. Pt was on 4L for the rest of the session, for activity and for recovery. Pt performed sit to stand from recliner and ambulated 30' with SPC. Pt returned to chair for a rest break and ambulated again for a distance of 40' back to his room, where he was repositioned in the recliner. Vitals were monitored throughout session. Pt required 4L O2 during activity and 2L at rest.  LPT recommends HHPT.  Vitals: Supine: HR 92 O2 91 2L RR 19 BP 174/138  Seated EOB: HR 103 O2 85  BP 168/94  Standing 3 min: HR 103 O2 96 BP 157/93  End of session: HR 105 O2 93 4L RR 13 BP 171/89    If plan is discharge home, recommend the following: A little help with bathing/dressing/bathroom;Assistance with cooking/housework;Assist for transportation;Help with stairs or ramp for entrance   Can travel by private vehicle        Equipment Recommendations  None recommended by PT    Recommendations for Other Services       Precautions / Restrictions Precautions Precautions: Fall Precaution Comments: monitor O2 Restrictions Weight Bearing Restrictions: No     Mobility  Bed Mobility Overal bed mobility: Modified  Independent Bed Mobility: Supine to Sit     Supine to sit: Modified independent (Device/Increase time)     General bed mobility comments: used momentum    Transfers Overall transfer level: Modified independent Equipment used: None               General transfer comment: Bed elevated for sit to stand, used armrests on recliner.    Ambulation/Gait Ambulation/Gait assistance: Supervision Gait Distance (Feet): 80 Feet Assistive device: Straight cane Gait Pattern/deviations: Wide base of support, Decreased stride length Gait velocity: decreased     General Gait Details: 10' + 30' + 6' with seated rest break. Pt ambulated on 4L O2 during ambulation, and required 6L O2 during one rest break to be >90%   Optometrist     Tilt Bed    Modified Rankin (Stroke Patients Only)       Balance                                            Cognition Arousal: Alert Behavior During Therapy: WFL for tasks assessed/performed Overall Cognitive Status: Within Functional Limits for tasks assessed                                 General Comments:  A&O x 4, very plesant        Exercises      General Comments        Pertinent Vitals/Pain Pain Assessment Pain Assessment: No/denies pain    Home Living                          Prior Function            PT Goals (current goals can now be found in the care plan section) Acute Rehab PT Goals Patient Stated Goal: to be able to walk farther PT Goal Formulation: With patient Time For Goal Achievement: 03/20/23 Progress towards PT goals: Progressing toward goals    Frequency    Min 1X/week      PT Plan      Co-evaluation              AM-PAC PT "6 Clicks" Mobility   Outcome Measure  Help needed turning from your back to your side while in a flat bed without using bedrails?: A Little Help needed moving from lying on your back to  sitting on the side of a flat bed without using bedrails?: A Little Help needed moving to and from a bed to a chair (including a wheelchair)?: None Help needed standing up from a chair using your arms (e.g., wheelchair or bedside chair)?: None Help needed to walk in hospital room?: None Help needed climbing 3-5 steps with a railing? : A Little 6 Click Score: 21    End of Session Equipment Utilized During Treatment: Oxygen Activity Tolerance: Patient limited by fatigue Patient left: in chair;with call bell/phone within reach Nurse Communication: Mobility status PT Visit Diagnosis: Difficulty in walking, not elsewhere classified (R26.2)     Time: 9629-5284 PT Time Calculation (min) (ACUTE ONLY): 25 min  Charges:    $Gait Training: 8-22 mins $Therapeutic Activity: 8-22 mins PT General Charges $$ ACUTE PT VISIT: 1 Visit                    Lazaro Arms, SPTA 03/09/2023, 11:57 AM

## 2023-03-10 ENCOUNTER — Other Ambulatory Visit (HOSPITAL_COMMUNITY): Payer: Self-pay

## 2023-03-10 DIAGNOSIS — I5031 Acute diastolic (congestive) heart failure: Secondary | ICD-10-CM | POA: Diagnosis not present

## 2023-03-10 DIAGNOSIS — E876 Hypokalemia: Secondary | ICD-10-CM | POA: Diagnosis not present

## 2023-03-10 DIAGNOSIS — L0390 Cellulitis, unspecified: Secondary | ICD-10-CM | POA: Diagnosis not present

## 2023-03-10 DIAGNOSIS — I10 Essential (primary) hypertension: Secondary | ICD-10-CM | POA: Diagnosis not present

## 2023-03-10 LAB — CBC
HCT: 41.7 % (ref 39.0–52.0)
Hemoglobin: 12.3 g/dL — ABNORMAL LOW (ref 13.0–17.0)
MCH: 30 pg (ref 26.0–34.0)
MCHC: 29.5 g/dL — ABNORMAL LOW (ref 30.0–36.0)
MCV: 101.7 fL — ABNORMAL HIGH (ref 80.0–100.0)
Platelets: 220 10*3/uL (ref 150–400)
RBC: 4.1 MIL/uL — ABNORMAL LOW (ref 4.22–5.81)
RDW: 15.9 % — ABNORMAL HIGH (ref 11.5–15.5)
WBC: 7.7 10*3/uL (ref 4.0–10.5)
nRBC: 0 % (ref 0.0–0.2)

## 2023-03-10 LAB — BASIC METABOLIC PANEL
Anion gap: 9 (ref 5–15)
BUN: 18 mg/dL (ref 6–20)
CO2: 34 mmol/L — ABNORMAL HIGH (ref 22–32)
Calcium: 8.7 mg/dL — ABNORMAL LOW (ref 8.9–10.3)
Chloride: 93 mmol/L — ABNORMAL LOW (ref 98–111)
Creatinine, Ser: 0.7 mg/dL (ref 0.61–1.24)
GFR, Estimated: >60 mL/min (ref >60)
Glucose, Bld: 122 mg/dL — ABNORMAL HIGH (ref 70–99)
Potassium: 3.7 mmol/L (ref 3.5–5.1)
Sodium: 136 mmol/L (ref 135–145)

## 2023-03-10 MED ORDER — TORSEMIDE 20 MG PO TABS
40.0000 mg | ORAL_TABLET | Freq: Every day | ORAL | 1 refills | Status: DC
  Filled 2023-03-10: qty 60, 30d supply, fill #0

## 2023-03-10 MED ORDER — LINEZOLID 600 MG PO TABS
600.0000 mg | ORAL_TABLET | Freq: Two times a day (BID) | ORAL | 0 refills | Status: AC
  Filled 2023-03-10: qty 8, 4d supply, fill #0

## 2023-03-10 NOTE — Discharge Summary (Signed)
Physician Discharge Summary  Kaceson Benally ZOX:[id number] DOB: 11-Jun-1980 DOA: 03/04/2023  PCP: Tracey Harries, MD  Admit date: 03/04/2023 Discharge date: 03/10/2023  Time spent: 60 minutes  Recommendations for Outpatient Follow-up:  Follow-up with Tracey Harries, MD in 1 to 2 weeks.  On follow-up patient will need a basic metabolic profile done to follow-up on electrolytes and renal function.  Patient's CHF will need to be reassessed and followed up upon.  Patient will need repeat ambulatory sats done on follow-up.  Ambulatory referral to pulmonary and cardiology made.   Discharge Diagnoses:  Principal Problem:   Sepsis due to cellulitis [facility]) Active Problems:   Acute respiratory failure with hypoxia (HCC)   Hypertension, benign   Morbid obesity (HCC)   OSA on CPAP   Acute diastolic congestive heart failure (HCC)   Hypokalemia   Discharge Condition: Stable and improved  Diet recommendation: Heart healthy  Filed Weights   03/06/23 1200 03/09/23 0956  Weight: (!) 325.7 kg (!) 288.5 kg    History of present illness:  HPI per Dr. Erskine Emery is a 42 y.o. male with medical history significant for hypertension, super morbid obesity, diastolic congestive heart failure being admitted to the hospital with sepsis due to cellulitis.  States that he started having swelling and redness of the left lower extremity a few days ago, also has been having fevers as high as 103 at home.  He has a standing order for p.o. doxycycline which she started about 10 days ago, after 4 days of taking this without any improvement, his PCP called in p.o. Keflex which he has now been taken for exactly 1 week.  Feeling more weak and shortness of breath with exertion.  He feels like he is volume overloaded, denies cough.  Due to the fevers he started having yesterday, he came to the ER for evaluation.  Here he has been started on empiric IV linezolid, IV Zosyn.  Given IV Lasix, and hospitalist  contacted for admission.   Hospital Course:  #1 sepsis due to cellulitis, POA -Patient with admission met criteria for sepsis with tachycardia, reported fevers, leukocytosis, lower extremity concerning for cellulitis. -Patient states usually gets cellulitis about once a year. -Patient currently hemodynamically stable, normal lactate, no evidence of endorgan dysfunction. -Blood cultures pending without no growth to date.. -Leukocytosis trended down.  Patient remained afebrile.  Afebrile. -Blood cultures with no growth to date x 3 days. -Patient initially was placed on IV Zosyn and IV linezolid.   -IV Zosyn subsequently discontinued and IV linezolid changed to oral linezolid.   -Patient will be discharged on 4 more days of linezolid to complete a 10-day course of treatment.   -Outpatient follow-up with PCP.   2.  Acute hypoxic respiratory failure -Patient noted to have presented with acute hypoxic respiratory failure noted per patient on presentation to have sats in the 80% on room air on presentation not on home O2. -Patient noted to have a baseline sats ranging anywhere from 89 to 93% in review of office records on epic. -Etiology likely multifactorial secondary to obesity hypoventilation syndrome, OSA, acute on chronic diastolic CHF exacerbation. -Patient denies any overt chest pain.  Patient unlikely to have a PE as denied any pleurisy. -Due to body habitus and weight unable to get a CT angiogram chest or V/Q study. -If D-dimer is checked will likely be elevated in the setting of sepsis and obesity. -Lower extremity Dopplers negative. -Patient states had significant urine output on diuresis with clinical improvement  with shortness of breath however still requiring O2. -Noted to have required 5 L O2 being weaned down currently on room air with sats of 89 to 92%.  -Patient noted to desats on ambulatory sats. -Patient maintained on IV diuretics during the hospitalization was adequately  diuresed and subsequently transition to oral Demadex on day of discharge and dose increased to 40 mg daily with close outpatient follow-up with PCP. -Patient maintained on CPAP nightly during the hospitalization. -Patient was discharged on home O2 with activity until follow-up with PCP. -Ambulatory referral to pulmonary for OSA and ambulatory referral to cardiology for CHF placed.  -Will need repeat ambulatory sats on follow-up with PCP.   3.  Acute on chronic diastolic CHF exacerbation -Last 2D echo 05/2021 nondiagnostic due to very poor echo windows and visualization of cardiac structures and unable to visualize LV endocardium however EF appeared to be within normal range of 55 to 65%.  Right ventricular systolic function was normal. -Repeat 2D echo unable to assess LVEF or wall motion due to inability to visualize endocardial segments from poor acoustical windows.  Unable to optimally evaluate regional wall motion.  Left ventricular diastolic function cannot be evaluated. -Patient denied any chest pain. -Patient states on torsemide 20 mg daily in addition to Cozaar. -Patient was placed on Lasix 60 mg IV every 12 hours during the hospitalization and subsequently increased to 80 mg IV every 12 hours with good urine output.   -Patient noted to be -25.6 L during this hospitalization.   -Weights done during the hospitalization were not accurate.   -Patient also maintained on home regimen Cozaar.   -Patient improved clinically and was back to baseline by day of discharge.  -Patient will be discharged on the increased dose of Demadex 40 mg daily with close outpatient follow-up with PCP. -Will need outpatient follow-up with cardiology. -Ambulatory referral sent to cardiology.   4.  Hypokalemia -Secondary to diuresis. -Repleted during the hospitalization.     5.  Obesity hypoventilation/OSA -CPAP nightly.   6.  Hypertension -Patient maintained on IV Lasix during the hospitalization as well as  Cozaar.  -Patient was discharged on the increased dose of Demadex 40 mg daily as well as continuation of home regimen Cozaar.   -Outpatient follow-up with PCP.     7.  Super morbid obesity -BMI 86.66 kg/m. -Lifestyle modification. -Outpatient follow-up with PCP.   Marland Kitchen  Procedures: Chest x-ray 03/04/2023 Plan forms of the left tib-fib 03/04/2023 2D echo 03/05/2023 Lower extremity Dopplers 03/05/2023  Consultations: None  Discharge Exam: Vitals:   03/10/23 0540 03/10/23 1059  BP: 122/69   Pulse: 78   Resp: 20   Temp: 97.8 F (36.6 C)   SpO2: 94% 91%    General: NAD Cardiovascular: Regular rate rhythm no murmurs rubs or gallops.  Unable to assess JVD due to body habitus.  Trace bilateral lower extremity edema.   Respiratory: Decreased breath sounds in the bases.  No significant crackles.  No wheezing.  Fair air movement.  Speaking in full sentences.  Discharge Instructions   Discharge Instructions     Ambulatory referral to Cardiology   Complete by: As directed    Ambulatory referral to Pulmonology   Complete by: As directed    OSA/OHS Needing reassessment for OSA.   Reason for referral: Other   Diet - low sodium heart healthy   Complete by: As directed    Increase activity slowly   Complete by: As directed    Increase activity slowly  Complete by: As directed    No wound care   Complete by: As directed    No wound care   Complete by: As directed       Allergies as of 03/10/2023       Reactions   Sulfa Antibiotics Hives, Rash        Medication List     STOP taking these medications    cephALEXin 500 MG capsule Commonly known as: KEFLEX   docusate sodium 100 MG capsule Commonly known as: COLACE   doxycycline 100 MG capsule Commonly known as: VIBRAMYCIN       TAKE these medications    acetaminophen 500 MG tablet Commonly known as: TYLENOL Take 2,000 mg by mouth daily as needed for moderate pain.   linezolid 600 MG tablet Commonly  known as: ZYVOX Take 1 tablet (600 mg total) by mouth every 12 (twelve) hours for 4 days.   losartan 100 MG tablet Commonly known as: COZAAR Take 100 mg by mouth daily.   meloxicam 15 MG tablet Commonly known as: MOBIC TAKE 1 TABLET BY MOUTH EVERY DAY AS NEEDED FOR PAIN What changed: See the new instructions.   OVER THE COUNTER MEDICATION Take 2 tablets by mouth at bedtime. Zquil   torsemide 20 MG tablet Commonly known as: DEMADEX Take 2 tablets (40 mg total) by mouth daily. What changed: how much to take               Durable Medical Equipment  (From admission, onward)           Start     Ordered   03/10/23 1151  For home use only DME oxygen  Once       Question Answer Comment  Length of Need 6 Months   Mode or (Route) Nasal cannula   Liters per Minute 4   Frequency Continuous (stationary and portable oxygen unit needed)   Oxygen conserving device Yes   Oxygen delivery system Gas      03/10/23 1151           Allergies  Allergen Reactions   Sulfa Antibiotics Hives and Rash    Follow-up Information     Tracey Harries, MD. Schedule an appointment as soon as possible for a visit in 1 week(s).   Specialty: Family Medicine Why: f/u in 1-2 weeks. Contact information: [address] [facility] [place] [address] [phone]                  The results of significant diagnostics from this hospitalization (including imaging, microbiology, ancillary and laboratory) are listed below for reference.    Significant Diagnostic Studies: VAS [place] LOWER EXTREMITY VENOUS (DVT)  Result Date: 03/05/2023  Lower Venous DVT Study Patient Name:  NOAHH BAUSMAN  Date of Exam:   03/05/2023 Medical Rec #: [id number]      Accession #:    [id number] Date of Birth: Mar 13, 1981      Patient Gender: M Patient Age:   57 years Exam Location[facility]  [facility] Procedure:      VAS [place] LOWER EXTREMITY VENOUS (DVT) Referring Phys: MIR [facility]  --------------------------------------------------------------------------------  Indications: Swelling, and Edema.  Risk Factors: Recent extended travel obesity. Limitations: Body habitus, bandages and poor ultrasound/tissue interface. Comparison Study: No significant changes seen since prior exam 05/30/21 Performing Technologist: Shona Simpson  Examination Guidelines: A complete evaluation includes B-mode imaging, spectral Doppler, color Doppler, and power Doppler as needed of all accessible portions  of each vessel. Bilateral testing is considered an integral part of a complete examination. Limited examinations for reoccurring indications may be performed as noted. The reflux portion of the exam is performed with the patient in reverse Trendelenburg.  +---------+---------------+---------+-----------+----------+-------------------+ RIGHT    CompressibilityPhasicitySpontaneityPropertiesThrombus Aging      +---------+---------------+---------+-----------+----------+-------------------+ CFV                                                   Not well visualized +---------+---------------+---------+-----------+----------+-------------------+ SFJ                                                   Not well visualized +---------+---------------+---------+-----------+----------+-------------------+ FV Prox  Full                                                             +---------+---------------+---------+-----------+----------+-------------------+ FV Mid   Full           Yes      Yes                                      +---------+---------------+---------+-----------+----------+-------------------+ FV Distal               Yes      Yes                  Patent by color     +---------+---------------+---------+-----------+----------+-------------------+ PFV                                                   Not well visualized  +---------+---------------+---------+-----------+----------+-------------------+ POP                     Yes      Yes                  Patent by color     +---------+---------------+---------+-----------+----------+-------------------+ PTV                                                   Patent by color     +---------+---------------+---------+-----------+----------+-------------------+ PERO                                                  Not well visualized +---------+---------------+---------+-----------+----------+-------------------+   +---------+---------------+---------+-----------+----------+-------------------+ LEFT     CompressibilityPhasicitySpontaneityPropertiesThrombus Aging      +---------+---------------+---------+-----------+----------+-------------------+ CFV  Not well visualized +---------+---------------+---------+-----------+----------+-------------------+ SFJ                                                   Not well visualized +---------+---------------+---------+-----------+----------+-------------------+ FV Prox  Full                                                             +---------+---------------+---------+-----------+----------+-------------------+ FV Mid   Full           Yes      Yes                                      +---------+---------------+---------+-----------+----------+-------------------+ FV Distal               Yes      Yes                                      +---------+---------------+---------+-----------+----------+-------------------+ PFV                                                   Not well visualized +---------+---------------+---------+-----------+----------+-------------------+ POP      Full           Yes      Yes                                      +---------+---------------+---------+-----------+----------+-------------------+  PTV                                                   Not well visualized +---------+---------------+---------+-----------+----------+-------------------+ PERO                                                  Not well visualized +---------+---------------+---------+-----------+----------+-------------------+     Summary: RIGHT: - There is no evidence of deep vein thrombosis in the lower extremity.  - No cystic structure found in the popliteal fossa.  LEFT: - There is no evidence of deep vein thrombosis in the lower extremity.  - No cystic structure found in the popliteal fossa.  *See table(s) above for measurements and observations. Electronically signed by Carolynn Sayers on 03/05/2023 at 4:06:06 PM.    Final    ECHOCARDIOGRAM COMPLETE  Result Date: 03/05/2023    ECHOCARDIOGRAM REPORT   Patient Name:   TASHAWN DEBARGE Date of Exam: 03/05/2023 Medical Rec #:  [id number]     Height:       74.0 in Accession #:    [id number]  Weight:       675.0 lb Date of Birth:  18-Aug-1980     BSA:          3.645 m Patient Age:    42 years      BP:           142/64 mmHg Patient Gender: M             HR:           89 bpm. Exam Location:  Inpatient Procedure: 2D Echo, Cardiac Doppler and Color Doppler Indications:    CHF- Acute Diastolic I50.31  History:        Patient has prior history of Echocardiogram examinations, most                 recent 05/30/2021. CHF and Cardiomegaly; Risk                 Factors:Hypertension and Sleep Apnea.  Sonographer:    Lucendia Herrlich RCS Referring Phys: [phone] Tabytha Gradillas V Alekai Pocock  Sonographer Comments: Suboptimal parasternal window, suboptimal subcostal window, no apical window and patient is obese. TDS due to morbid obesity, over 600 pounds. IMPRESSIONS  1. Cannot assess LVF or wall motion due to inability to visualize endocardial segments from poor acoustical windows. Left ventricular endocardial border not optimally defined to evaluate regional wall motion. Left ventricular diastolic  function could not be evaluated.  2. Right ventricular systolic function was not well visualized. The right ventricular size is not well visualized. There is normal pulmonary artery systolic pressure. The estimated right ventricular systolic pressure is 26.2 mmHg.  3. The mitral valve is normal in structure. No evidence of mitral valve regurgitation. No evidence of mitral stenosis.  4. The aortic valve was not well visualized. Aortic valve regurgitation is not visualized.  5. The inferior vena cava is normal in size with greater than 50% respiratory variability, suggesting right atrial pressure of 3 mmHg. FINDINGS  Left Ventricle: Left ventricular ejection fraction, by estimation, is Cannot assess due to inability to visualize endocardial segments from poor acoustical windows%. The left ventricle has Cannot assess function. Left ventricular endocardial border not optimally defined to evaluate regional wall motion. The left ventricular internal cavity size was cannot assess. There is no left ventricular hypertrophy. Left ventricular diastolic function could not be evaluated. Right Ventricle: The right ventricular size is not well visualized. Right vetricular wall thickness was not well visualized. Right ventricular systolic function was not well visualized. There is normal pulmonary artery systolic pressure. The tricuspid regurgitant velocity is 1.67 m/s, and with an assumed right atrial pressure of 15 mmHg, the estimated right ventricular systolic pressure is 26.2 mmHg. Left Atrium: Left atrial size was not well visualized. Right Atrium: Right atrial size was not well visualized. Pericardium: There is no evidence of pericardial effusion. Mitral Valve: The mitral valve is normal in structure. No evidence of mitral valve regurgitation. No evidence of mitral valve stenosis. Tricuspid Valve: The tricuspid valve is not well visualized. Tricuspid valve regurgitation is trivial. No evidence of tricuspid stenosis. Aortic  Valve: The aortic valve was not well visualized. Aortic valve regurgitation is not visualized. Pulmonic Valve: The pulmonic valve was not well visualized. Pulmonic valve regurgitation is not visualized. No evidence of pulmonic stenosis. Aorta: The aortic root is normal in size and structure. Venous: The inferior vena cava is normal in size with greater than 50% respiratory variability, suggesting right atrial pressure of 3 mmHg. IAS/Shunts: No atrial level shunt detected by color  flow Doppler.  IVC IVC diam: 3.40 cm  AORTA Ao Asc diam: 3.65 cm TRICUSPID VALVE TR Peak grad:   11.2 mmHg TR Vmax:        167.00 cm/s Armanda Magic MD Electronically signed by Armanda Magic MD Signature Date/Time: 03/05/2023/1:03:11 PM    Final    DG Tibia/Fibula Left Port  Result Date: 03/04/2023 CLINICAL DATA:  Infection, left leg cellulitis. EXAM: PORTABLE LEFT TIBIA AND FIBULA - 2 VIEW COMPARISON:  None Available. FINDINGS: Cortical margins of the tibia and fibular intact. No fracture. No erosive or bony destructive change. No knee or ankle dislocation. Generalized subcutaneous edema versus habitus. Scattered ill-defined soft tissue calcifications. No soft tissue gas or radiopaque foreign body. IMPRESSION: 1. Generalized subcutaneous edema versus habitus. No soft tissue gas or radiopaque foreign body. 2. No radiographic evidence of osteomyelitis. Electronically Signed   By: Narda Rutherford M.D.   On: 03/04/2023 16:01   DG Chest Portable 1 View  Result Date: 03/04/2023 CLINICAL DATA:  Shortness of breath. EXAM: PORTABLE CHEST 1 VIEW COMPARISON:  05/29/2021 FINDINGS: Stable prominent cardiac silhouette. Peribronchial thickening without focal airspace disease. No pleural effusion. No pneumothorax. More detailed assessment is limited due to soft tissue attenuation from habitus. IMPRESSION: 1. Peribronchial thickening without focal airspace disease. 2. Stable prominent cardiac silhouette. Electronically Signed   By: Narda Rutherford M.D.   On: 03/04/2023 16:00    Microbiology: Recent Results (from the past 240 hour(s))  Culture, blood (routine x 2)     Status: None   Collection Time: 03/04/23 11:35 AM   Specimen: BLOOD  Result Value Ref Range Status   Specimen Description   Final    BLOOD RIGHT ANTECUBITAL Performed at [facility], [address], [place], [place] [address]    Special Requests   Final    BOTTLES DRAWN AEROBIC AND ANAEROBIC Blood Culture adequate volume Performed at [facility], [address], [place], [place] [address]    Culture   Final    NO GROWTH 5 DAYS Performed at [facility], [address], [place], [place] [address]    Report Status 03/09/2023 FINAL  Final  Culture, blood (routine x 2)     Status: None   Collection Time: 03/04/23 12:00 PM   Specimen: BLOOD  Result Value Ref Range Status   Specimen Description   Final    BLOOD LEFT ANTECUBITAL Performed at [facility], [address], [place], [place] [address]    Special Requests   Final    BOTTLES DRAWN AEROBIC AND ANAEROBIC Blood Culture adequate volume Performed at [facility], [address], [place], [place] [address]    Culture   Final    NO GROWTH 5 DAYS Performed at [facility], [address] [place], [place] [address]    Report Status 03/09/2023 FINAL  Final  SARS Coronavirus 2 by RT PCR (hospital order, performed in [facility] hospital lab) *cepheid single result test* Anterior Nasal Swab     Status: None   Collection Time: 03/04/23 12:32 PM   Specimen: Anterior Nasal Swab  Result Value Ref Range Status   SARS Coronavirus 2 by RT PCR NEGATIVE NEGATIVE Final    Comment: (NOTE) SARS-CoV-2 target nucleic acids are NOT DETECTED.  The SARS-CoV-2 RNA is generally detectable in upper and lower respiratory specimens during the acute phase of infection. The lowest concentration of SARS-CoV-2 viral copies this assay  can detect is  250 copies / mL. A negative result does not preclude SARS-CoV-2 infection and should not be used as the sole basis for treatment or other patient management decisions.  A negative result may occur with improper specimen collection / handling, submission of specimen other than nasopharyngeal swab, presence of viral mutation(s) within the areas targeted by this assay, and inadequate number of viral copies (<250 copies / mL). A negative result must be combined with clinical observations, patient history, and epidemiological information.  Fact Sheet for Patients:   RoadLapTop.co.za  Fact Sheet for Healthcare Providers: http://kim-miller.com/  This test is not yet approved or  cleared by the [place] FDA and has been authorized for detection and/or diagnosis of SARS-CoV-2 by FDA under an Emergency Use Authorization (EUA).  This EUA will remain in effect (meaning this test can be used) for the duration of the COVID-19 declaration under Section 564(b)(1) of the Act, 21 U.S.C. section 360bbb-3(b)(1), unless the authorization is terminated or revoked sooner.  Performed at [facility], [address] [place], [place] [address]   MRSA Next Gen by PCR, Nasal     Status: None   Collection Time: 03/04/23  6:48 PM   Specimen: Nasal Mucosa; Nasal Swab  Result Value Ref Range Status   MRSA by PCR Next Gen NOT DETECTED NOT DETECTED Final    Comment: (NOTE) The GeneXpert MRSA Assay (FDA approved for NASAL specimens only), is one component of a comprehensive MRSA colonization surveillance program. It is not intended to diagnose MRSA infection nor to guide or monitor treatment for MRSA infections. Test performance is not FDA approved in patients less than 69 years old. Performed at [facility], [address] [place], [place] [address]      Labs: Basic Metabolic Panel: Recent Labs  Lab  03/05/23 1012 03/06/23 0309 03/07/23 0320 03/08/23 0300 03/09/23 0305 03/10/23 0415  NA  --  137 135 135 136 136  K  --  3.5 3.5 3.6 3.7 3.7  CL  --  94* 91* 90* 92* 93*  CO2  --  36* 36* 36* 36* 34*  GLUCOSE  --  128* 125* 120* 126* 122*  BUN  --  14 16 17 18 18   CREATININE  --  0.73 0.76 0.62 0.73 0.70  CALCIUM  --  8.1* 8.1* 8.5* 8.7* 8.7*  MG 1.8 2.0 2.1 2.1  --   --    Liver Function Tests: Recent Labs  Lab 03/04/23 1135  AST 26  ALT 38  ALKPHOS 73  BILITOT 0.8  PROT 7.9  ALBUMIN 3.4*   No results for input(s): "LIPASE", "AMYLASE" in the last 168 hours. No results for input(s): "AMMONIA" in the last 168 hours. CBC: Recent Labs  Lab 03/04/23 1135 03/05/23 0305 03/06/23 0309 03/07/23 0320 03/08/23 0300 03/09/23 0305 03/10/23 0415  WBC 17.5*   < > 9.6 8.1 6.9 7.7 7.7  NEUTROABS 15.9*  --  7.1  --   --   --   --   HGB 14.2   < > 11.6* 11.5* 11.7* 12.0* 12.3*  HCT 46.6   < > 38.5* 38.6* 40.4 38.8* 41.7  MCV 100.4*   < > 102.1* 102.1* 103.1* 99.7 101.7*  PLT 309   < > 202 211 203 214 220   < > = values in this interval not displayed.   Cardiac Enzymes: No results for input(s): "CKTOTAL", "CKMB", "CKMBINDEX", "TROPONINI" in the last 168 hours. BNP: BNP (last 3 results) Recent Labs  03/04/23 1135  BNP 95.7    ProBNP (last 3 results) No results for input(s): "PROBNP" in the last 8760 hours.  CBG: No results for input(s): "GLUCAP" in the last 168 hours.     Signed:  Ramiro Harvest MD.  [facility] 03/10/2023, 4:10 PM

## 2023-03-10 NOTE — TOC Benefit Eligibility Note (Signed)
Pharmacy Patient Advocate Encounter  Insurance verification completed.    The patient is insured through [employer] test claim for Linezolid. This medication required a prior authorization and was submitted 03-10-2023  Prior Authorization has been APPROVED from 03-10-2023 to 04-09-2023. Ran test claim, Copay is $10.00. This test claim was processed through [facility]- copay amounts may vary at other pharmacies due to pharmacy/plan contracts, or as the patient moves through the different stages of their insurance plan.   PA #/Case ID/Reference #: [id number] [id number]

## 2023-03-10 NOTE — Progress Notes (Signed)
Physical Therapy Treatment Patient Details Name: Willie Steele MRN: [id number] DOB: 1980-08-20 Today's Date: 03/10/2023   History of Present Illness Patient is a 42 year old male who presented with fever and possible cellulitis in LLE with current treatment ongoing for RLE. Patient was admitted with sepsis secondary to cellulitis, acute hypoxic respiratory failure. PMH: acute on chronic CHF, hypokalemia, HTN, obesity hypoventilation, OSA, super morbid obesity,    PT Comments  General Comments: A&O x 4, very pleasant/motivated/eager to go home.  Assisted with amb in hallway while monitoring sats.  SATURATION QUALIFICATIONS: (This note is used to comply with regulatory documentation for home oxygen)   Patient Saturations on Room Air at Rest = 87%   Patient Saturations on Room Air while Ambulating 20 feet= 74% HR 110 Dyspnea 4/4   Patient Saturations on 4 Liters of oxygen while Ambulating 20 feet= 88%   Please briefly explain why patient needs home oxygen:  Pt requires 4 lts of supplemental oxygen during activity to achieve therapeutic levels.  Educated pt on use of pulse Ox as Pt stated he has one at home.  Ideal sats need to be >88%.  Also instructed on deep breathing with forced exhalation as Pt's CO2 is elevated.  Educated on increasing his activity level to tolerance. Also advised pt to invest in purchasing 2 "talking" scales, stand on one for R leg and one for L leg then add the sum to monitor his weight.   LPT has rec [facility] PT however pt declines.  Pt plans to "get back to life" and "go back to work".  Pt did agree to use supplemental oxygen during activity after long discussion with Hospitalist.    Per MD, Pt will D/C to home later today.      If plan is discharge home, recommend the following: A little help with bathing/dressing/bathroom;Assistance with cooking/housework;Assist for transportation;Help with stairs or ramp for entrance   Can travel by private vehicle         Equipment Recommendations  None recommended by PT    Recommendations for Other Services       Precautions / Restrictions Precautions Precautions: Fall Precaution Comments: monitor O2 Restrictions Weight Bearing Restrictions: No     Mobility  Bed Mobility Overal bed mobility: Modified Independent       Supine to sit: Modified independent (Device/Increase time)     General bed mobility comments: used momentum    Transfers Overall transfer level: Modified independent                 General transfer comment: Bed elevated for sit to stand, used armrests on recliner.    Ambulation/Gait Ambulation/Gait assistance: Modified independent (Device/Increase time), Supervision Gait Distance (Feet): 40 Feet (20 feet x 2)   Gait Pattern/deviations: Wide base of support, Decreased stride length Gait velocity: decreased     General Gait Details: limited amb distance 20 feet x 2 due to 4/4 dyspnea with RA decreased from 87 at rest to 74% with activity.  Heavy breathing.   Stairs             Wheelchair Mobility     Tilt Bed    Modified Rankin (Stroke Patients Only)       Balance                                            Cognition  General Comments: A&O x 4, very plesant/motivated/eager to go home        Exercises      General Comments        Pertinent Vitals/Pain Pain Assessment Pain Assessment: No/denies pain    Home Living                          Prior Function            PT Goals (current goals can now be found in the care plan section) Progress towards PT goals: Progressing toward goals    Frequency    Min 1X/week      PT Plan      Co-evaluation              AM-PAC PT "6 Clicks" Mobility   Outcome Measure  Help needed turning from your back to your side while in a flat bed without using bedrails?: A Little Help needed moving  from lying on your back to sitting on the side of a flat bed without using bedrails?: A Little Help needed moving to and from a bed to a chair (including a wheelchair)?: A Little Help needed standing up from a chair using your arms (e.g., wheelchair or bedside chair)?: A Little Help needed to walk in hospital room?: A Little   6 Click Score: 15    End of Session Equipment Utilized During Treatment: Oxygen;Gait belt Activity Tolerance: Patient limited by fatigue Patient left: in chair;with call bell/phone within reach Nurse Communication: Mobility status PT Visit Diagnosis: Difficulty in walking, not elsewhere classified (R26.2)     Time: 1110-1140 PT Time Calculation (min) (ACUTE ONLY): 30 min  Charges:    $Gait Training: 8-22 mins $Therapeutic Activity: 8-22 mins PT General Charges $$ ACUTE PT VISIT: 1 Visit                    Felecia Shelling  PTA Acute  Rehabilitation Services Office M-F          [phone]

## 2023-03-10 NOTE — TOC Initial Note (Addendum)
Transition of Care [facility]) - Initial/Assessment Note    Patient Details  Name: Willie Steele MRN: [id number] Date of Birth: 1981-01-30  [facility] CM/SW Contact:    Larrie Kass, LCSW Phone Number: 03/10/2023, 11:00 AM  Clinical Narrative:                 CSW met with pt to discuss home health recommendations.Pt has declined HH , pt states "I work and never need anything like that." Pt reports using a cane and no other DME needs. Pt states his mother will provide transportation at the time of discharge. No other TOC needs TOC sign off.    Adden  3:00pm Pt recommended for home O2, pt is agreeable. CSW sent referral to [facility] for portable tank be delivered to pt's room prior to d/c. TOC sign off.   Expected Discharge Plan: Home/Self Care Barriers to Discharge: Continued Medical Work up   Patient Goals and CMS Choice Patient states their goals for this hospitalization and ongoing recovery are:: return home          Expected Discharge Plan and Services       Living arrangements for the past 2 months: Single Family Home                                      Prior Living Arrangements/Services Living arrangements for the past 2 months: Single Family Home Lives with:: Self Patient language and need for interpreter reviewed:: Yes Do you feel safe going back to the place where you live?: Yes      Need for Family Participation in Patient Care: No (Comment) Care giver support system in place?: No (comment)   Criminal Activity/Legal Involvement Pertinent to Current Situation/Hospitalization: No - Comment as needed  Activities of Daily Living   ADL Screening (condition at time of admission) Independently performs ADLs?: Yes (appropriate for developmental age) Is the patient deaf or have difficulty hearing?: No Does the patient have difficulty seeing, even when wearing glasses/contacts?: No Does the patient have difficulty concentrating,  remembering, or making decisions?: No  Permission Sought/Granted                  Emotional Assessment Appearance:: Appears stated age Attitude/Demeanor/Rapport: Engaged, Charismatic Affect (typically observed): Accepting Orientation: : Oriented to Place, Oriented to  Time, Oriented to Situation, Oriented to Self   Psych Involvement: No (comment)  Admission diagnosis:  Hypoxia [R09.02] Cellulitis of left lower extremity [L03.116] Sepsis due to cellulitis (HCC) [L03.90, A41.9] Congestive heart failure, unspecified HF chronicity, unspecified heart failure type [facility]) [I50.9] Patient Active Problem List   Diagnosis Date Noted   Hypokalemia 03/05/2023   Sepsis due to cellulitis (HCC) 03/04/2023   OSA on CPAP 06/02/2021   Prediabetes 06/02/2021   Acute diastolic congestive heart failure (HCC) 06/02/2021   Acute respiratory failure with hypoxia (HCC) 05/29/2021   Cardiomegaly 05/29/2021   Hypertension, benign 08/03/2012   Morbid obesity (HCC) 08/03/2012   PCP:  Tracey Harries, MD Pharmacy:   [facility] [phone] - [place], [place] - [address] AT [facility] OF [address] [place] [address] Phone: [phone] Fax: [phone]  [id number] [place], [place] - [address] AT ACROSS [address] [place] [address] Phone: [phone] Fax: [phone]  [place] [facility] [address] [facility] [place]  [place] [address] Phone: [phone] Fax: [phone]     Social Determinants of Health (SDOH) Social History: SDOH Screenings   Food Insecurity: No Food Insecurity (03/05/2023)  Housing: Low Risk  (03/05/2023)  Transportation Needs: No Transportation Needs (03/05/2023)  Utilities: Not At Risk (03/05/2023)  Financial Resource Strain: Low Risk  (05/08/2022)   Received from [facility], [facility]  Physical Activity: Inactive (11/12/2021)   Received from [facility], [facility], [facility]  Social Connections: Unknown (11/14/2022)   Received from [facility]  Stress: Stress Concern Present (11/12/2021)   Received from [facility], [facility], [facility]  Tobacco Use: Medium Risk (03/04/2023)   SDOH Interventions:     Readmission Risk Interventions     No data to display

## 2023-03-10 NOTE — Progress Notes (Signed)
Physical Therapy  SATURATION QUALIFICATIONS: (This note is used to comply with regulatory documentation for home oxygen)  Patient Saturations on Room Air at Rest = 87%  Patient Saturations on Room Air while Ambulating 20 feet= 74% HR 110 Dyspnea 4/4  Patient Saturations on 4 Liters of oxygen while Ambulating 20 feet= 88%  Please briefly explain why patient needs home oxygen:  Pt requires 4 lts of supplemental oxygen during activity to achieve therapeutic levels.  Willie Steele  PTA Acute  Rehabilitation Services Office M-F          [phone]

## 2023-03-25 ENCOUNTER — Emergency Department (HOSPITAL_COMMUNITY): Payer: Managed Care, Other (non HMO)

## 2023-03-25 ENCOUNTER — Other Ambulatory Visit: Payer: Self-pay

## 2023-03-25 ENCOUNTER — Inpatient Hospital Stay (HOSPITAL_COMMUNITY)
Admission: EM | Admit: 2023-03-25 | Discharge: 2023-03-28 | DRG: 291 | Disposition: A | Discharge status: Home / Self Care | Payer: Managed Care, Other (non HMO) | Attending: Internal Medicine | Admitting: Internal Medicine

## 2023-03-25 ENCOUNTER — Encounter (HOSPITAL_COMMUNITY): Payer: Self-pay

## 2023-03-25 DIAGNOSIS — R7303 Prediabetes: Secondary | ICD-10-CM | POA: Diagnosis present

## 2023-03-25 DIAGNOSIS — R0600 Dyspnea, unspecified: Secondary | ICD-10-CM

## 2023-03-25 DIAGNOSIS — I110 Hypertensive heart disease with heart failure: Principal | ICD-10-CM | POA: Diagnosis present

## 2023-03-25 DIAGNOSIS — G4733 Obstructive sleep apnea (adult) (pediatric): Secondary | ICD-10-CM

## 2023-03-25 DIAGNOSIS — L0390 Cellulitis, unspecified: Secondary | ICD-10-CM | POA: Diagnosis present

## 2023-03-25 DIAGNOSIS — Z1152 Encounter for screening for COVID-19: Secondary | ICD-10-CM

## 2023-03-25 DIAGNOSIS — I517 Cardiomegaly: Secondary | ICD-10-CM | POA: Diagnosis present

## 2023-03-25 DIAGNOSIS — I4891 Unspecified atrial fibrillation: Secondary | ICD-10-CM | POA: Diagnosis present

## 2023-03-25 DIAGNOSIS — E781 Pure hyperglyceridemia: Secondary | ICD-10-CM | POA: Diagnosis present

## 2023-03-25 DIAGNOSIS — E669 Obesity, unspecified: Secondary | ICD-10-CM | POA: Diagnosis present

## 2023-03-25 DIAGNOSIS — I4892 Unspecified atrial flutter: Secondary | ICD-10-CM | POA: Diagnosis not present

## 2023-03-25 DIAGNOSIS — I2489 Other forms of acute ischemic heart disease: Secondary | ICD-10-CM | POA: Diagnosis present

## 2023-03-25 DIAGNOSIS — Z87891 Personal history of nicotine dependence: Secondary | ICD-10-CM

## 2023-03-25 DIAGNOSIS — Z6841 Body Mass Index (BMI) 40.0 and over, adult: Secondary | ICD-10-CM

## 2023-03-25 DIAGNOSIS — R0989 Other specified symptoms and signs involving the circulatory and respiratory systems: Secondary | ICD-10-CM

## 2023-03-25 DIAGNOSIS — R0902 Hypoxemia: Secondary | ICD-10-CM | POA: Diagnosis present

## 2023-03-25 DIAGNOSIS — I890 Lymphedema, not elsewhere classified: Secondary | ICD-10-CM | POA: Diagnosis present

## 2023-03-25 DIAGNOSIS — E66813 Obesity, class 3: Secondary | ICD-10-CM | POA: Diagnosis present

## 2023-03-25 DIAGNOSIS — I5033 Acute on chronic diastolic (congestive) heart failure: Secondary | ICD-10-CM | POA: Diagnosis present

## 2023-03-25 DIAGNOSIS — Z79899 Other long term (current) drug therapy: Secondary | ICD-10-CM

## 2023-03-25 DIAGNOSIS — Z882 Allergy status to sulfonamides status: Secondary | ICD-10-CM

## 2023-03-25 DIAGNOSIS — Z7901 Long term (current) use of anticoagulants: Secondary | ICD-10-CM

## 2023-03-25 DIAGNOSIS — Z8489 Family history of other specified conditions: Secondary | ICD-10-CM

## 2023-03-25 LAB — COMPREHENSIVE METABOLIC PANEL
ALT: 38 U/L (ref 0–44)
AST: 24 U/L (ref 15–41)
Albumin: 3.4 g/dL — ABNORMAL LOW (ref 3.5–5.0)
Alkaline Phosphatase: 68 U/L (ref 38–126)
Anion gap: 10 (ref 5–15)
BUN: 21 mg/dL — ABNORMAL HIGH (ref 6–20)
CO2: 24 mmol/L (ref 22–32)
Calcium: 9 mg/dL (ref 8.9–10.3)
Chloride: 103 mmol/L (ref 98–111)
Creatinine, Ser: 0.94 mg/dL (ref 0.61–1.24)
GFR, Estimated: >60 mL/min (ref >60)
Glucose, Bld: 133 mg/dL — ABNORMAL HIGH (ref 70–99)
Potassium: 3.7 mmol/L (ref 3.5–5.1)
Sodium: 137 mmol/L (ref 135–145)
Total Bilirubin: 0.6 mg/dL (ref <1.2)
Total Protein: 8 g/dL (ref 6.5–8.1)

## 2023-03-25 LAB — I-STAT CG4 LACTIC ACID, ED
Lactic Acid, Venous: 1.7 mmol/L (ref 0.5–1.9)
Lactic Acid, Venous: 1.4 mmol/L (ref 0.5–1.9)

## 2023-03-25 LAB — CBC WITH DIFFERENTIAL/PLATELET
Abs Immature Granulocytes: 0.05 10*3/uL (ref 0.00–0.07)
Basophils Absolute: 0 10*3/uL (ref 0.0–0.1)
Basophils Relative: 0 %
Eosinophils Absolute: 0.1 10*3/uL (ref 0.0–0.5)
Eosinophils Relative: 1 %
HCT: 45.7 % (ref 39.0–52.0)
Hemoglobin: 13.8 g/dL (ref 13.0–17.0)
Immature Granulocytes: 1 %
Lymphocytes Relative: 15 %
Lymphs Abs: 1.5 10*3/uL (ref 0.7–4.0)
MCH: 29.8 pg (ref 26.0–34.0)
MCHC: 30.2 g/dL (ref 30.0–36.0)
MCV: 98.7 fL (ref 80.0–100.0)
Monocytes Absolute: 0.5 10*3/uL (ref 0.1–1.0)
Monocytes Relative: 5 %
Neutro Abs: 7.8 10*3/uL — ABNORMAL HIGH (ref 1.7–7.7)
Neutrophils Relative %: 78 %
Platelets: 262 10*3/uL (ref 150–400)
RBC: 4.63 MIL/uL (ref 4.22–5.81)
RDW: 16.8 % — ABNORMAL HIGH (ref 11.5–15.5)
WBC: 9.9 10*3/uL (ref 4.0–10.5)
nRBC: 0 % (ref 0.0–0.2)

## 2023-03-25 LAB — TSH: TSH: 1.421 u[IU]/mL (ref 0.350–4.500)

## 2023-03-25 LAB — HEMOGLOBIN A1C
Hgb A1c MFr Bld: 6 % — ABNORMAL HIGH (ref 4.8–5.6)
Mean Plasma Glucose: 125.5 mg/dL

## 2023-03-25 LAB — TROPONIN I (HIGH SENSITIVITY)
Troponin I (High Sensitivity): 26 ng/L — ABNORMAL HIGH (ref <18)
Troponin I (High Sensitivity): 38 ng/L — ABNORMAL HIGH (ref <18)

## 2023-03-25 LAB — PHOSPHORUS: Phosphorus: 3.7 mg/dL (ref 2.5–4.6)

## 2023-03-25 LAB — MAGNESIUM: Magnesium: 1.9 mg/dL (ref 1.7–2.4)

## 2023-03-25 LAB — RESP PANEL BY RT-PCR (RSV, FLU A&B, COVID)  RVPGX2
Influenza A by PCR: NEGATIVE
Influenza B by PCR: NEGATIVE
Resp Syncytial Virus by PCR: NEGATIVE
SARS Coronavirus 2 by RT PCR: NEGATIVE

## 2023-03-25 LAB — GLUCOSE, CAPILLARY: Glucose-Capillary: 110 mg/dL — ABNORMAL HIGH (ref 70–99)

## 2023-03-25 LAB — BRAIN NATRIURETIC PEPTIDE: B Natriuretic Peptide: 73 pg/mL (ref 0.0–100.0)

## 2023-03-25 LAB — MRSA NEXT GEN BY PCR, NASAL: MRSA by PCR Next Gen: NOT DETECTED

## 2023-03-25 LAB — PROTIME-INR
INR: 1.3 — ABNORMAL HIGH (ref 0.8–1.2)
Prothrombin Time: 16.2 s — ABNORMAL HIGH (ref 11.4–15.2)

## 2023-03-25 LAB — APTT: aPTT: 49 s — ABNORMAL HIGH (ref 24–36)

## 2023-03-25 MED ORDER — DILTIAZEM LOAD VIA INFUSION
20.0000 mg | Freq: Once | INTRAVENOUS | Status: AC
  Administered 2023-03-25: 20 mg via INTRAVENOUS
  Filled 2023-03-25: qty 20

## 2023-03-25 MED ORDER — METOPROLOL TARTRATE 5 MG/5ML IV SOLN
5.0000 mg | Freq: Once | INTRAVENOUS | Status: AC
  Administered 2023-03-25: 5 mg via INTRAVENOUS
  Filled 2023-03-25: qty 5

## 2023-03-25 MED ORDER — VANCOMYCIN HCL IN DEXTROSE 1-5 GM/200ML-% IV SOLN: 1000.0000 mg | Freq: Once | INTRAVENOUS | Status: DC

## 2023-03-25 MED ORDER — VANCOMYCIN HCL 500 MG/100ML IV SOLN
500.0000 mg | Freq: Once | INTRAVENOUS | Status: DC
  Filled 2023-03-25: qty 100

## 2023-03-25 MED ORDER — METRONIDAZOLE 500 MG/100ML IV SOLN
500.0000 mg | Freq: Once | INTRAVENOUS | Status: AC
  Administered 2023-03-25: 500 mg via INTRAVENOUS
  Filled 2023-03-25: qty 100

## 2023-03-25 MED ORDER — ACETAMINOPHEN 650 MG RE SUPP: 650.0000 mg | Freq: Four times a day (QID) | RECTAL | Status: DC | PRN

## 2023-03-25 MED ORDER — DILTIAZEM HCL-DEXTROSE 125-5 MG/125ML-% IV SOLN (PREMIX)
5.0000 mg/h | INTRAVENOUS | Status: DC
  Administered 2023-03-25: 5 mg/h via INTRAVENOUS
  Administered 2023-03-25 – 2023-03-26 (×4): 15 mg/h via INTRAVENOUS
  Administered 2023-03-27: 5 mg/h via INTRAVENOUS
  Filled 2023-03-25 (×5): qty 125

## 2023-03-25 MED ORDER — HEPARIN (PORCINE) 25000 UT/250ML-% IV SOLN
1500.0000 [IU]/h | INTRAVENOUS | Status: AC
  Administered 2023-03-25: 1500 [IU]/h via INTRAVENOUS
  Filled 2023-03-25: qty 250

## 2023-03-25 MED ORDER — POTASSIUM CHLORIDE CRYS ER 20 MEQ PO TBCR
40.0000 meq | EXTENDED_RELEASE_TABLET | Freq: Once | ORAL | Status: AC
  Administered 2023-03-25: 40 meq via ORAL
  Filled 2023-03-25: qty 2

## 2023-03-25 MED ORDER — LINEZOLID 600 MG PO TABS
600.0000 mg | ORAL_TABLET | Freq: Two times a day (BID) | ORAL | Status: DC
  Administered 2023-03-25 – 2023-03-28 (×6): 600 mg via ORAL
  Filled 2023-03-25 (×6): qty 1

## 2023-03-25 MED ORDER — LEVALBUTEROL HCL 0.63 MG/3ML IN NEBU: 0.6300 mg | INHALATION_SOLUTION | Freq: Four times a day (QID) | RESPIRATORY_TRACT | Status: DC | PRN

## 2023-03-25 MED ORDER — POTASSIUM CHLORIDE CRYS ER 20 MEQ PO TBCR
40.0000 meq | EXTENDED_RELEASE_TABLET | Freq: Every day | ORAL | Status: DC
  Administered 2023-03-26 – 2023-03-28 (×3): 40 meq via ORAL
  Filled 2023-03-25 (×3): qty 2

## 2023-03-25 MED ORDER — APIXABAN 5 MG PO TABS
5.0000 mg | ORAL_TABLET | Freq: Two times a day (BID) | ORAL | Status: DC
  Administered 2023-03-25 – 2023-03-28 (×6): 5 mg via ORAL
  Filled 2023-03-25 (×6): qty 1

## 2023-03-25 MED ORDER — CHLORHEXIDINE GLUCONATE CLOTH 2 % EX PADS
6.0000 | MEDICATED_PAD | Freq: Every day | CUTANEOUS | Status: DC
  Administered 2023-03-26 – 2023-03-28 (×3): 6 via TOPICAL

## 2023-03-25 MED ORDER — INSULIN ASPART 100 UNIT/ML IJ SOLN: 0.0000 [IU] | Freq: Three times a day (TID) | INTRAMUSCULAR | Status: DC

## 2023-03-25 MED ORDER — HEPARIN BOLUS VIA INFUSION
6000.0000 [IU] | Freq: Once | INTRAVENOUS | Status: AC
  Administered 2023-03-25: 6000 [IU] via INTRAVENOUS
  Filled 2023-03-25: qty 6000

## 2023-03-25 MED ORDER — HYDRALAZINE HCL 20 MG/ML IJ SOLN: 10.0000 mg | Freq: Four times a day (QID) | INTRAMUSCULAR | Status: DC | PRN

## 2023-03-25 MED ORDER — MAGNESIUM SULFATE 2 GM/50ML IV SOLN
2.0000 g | Freq: Once | INTRAVENOUS | Status: AC
  Administered 2023-03-25: 2 g via INTRAVENOUS
  Filled 2023-03-25: qty 50

## 2023-03-25 MED ORDER — MELATONIN 5 MG PO TABS
10.0000 mg | ORAL_TABLET | Freq: Every evening | ORAL | Status: DC | PRN
  Administered 2023-03-25 – 2023-03-27 (×3): 10 mg via ORAL
  Filled 2023-03-25 (×3): qty 2

## 2023-03-25 MED ORDER — ACETAMINOPHEN 325 MG PO TABS: 650.0000 mg | ORAL_TABLET | Freq: Four times a day (QID) | ORAL | Status: DC | PRN

## 2023-03-25 MED ORDER — SODIUM CHLORIDE 0.9 % IV SOLN: 2.0000 g | INTRAVENOUS | Status: DC

## 2023-03-25 MED ORDER — SODIUM CHLORIDE 0.9 % IV SOLN
2.0000 g | Freq: Once | INTRAVENOUS | Status: AC
  Administered 2023-03-25: 2 g via INTRAVENOUS
  Filled 2023-03-25: qty 12.5

## 2023-03-25 MED ORDER — VANCOMYCIN HCL 2000 MG/400ML IV SOLN
2000.0000 mg | Freq: Once | INTRAVENOUS | Status: DC
  Filled 2023-03-25: qty 400

## 2023-03-25 MED ORDER — POTASSIUM CHLORIDE CRYS ER 20 MEQ PO TBCR
40.0000 meq | EXTENDED_RELEASE_TABLET | Freq: Every day | ORAL | Status: AC
  Administered 2023-03-25: 40 meq via ORAL
  Filled 2023-03-25: qty 2

## 2023-03-25 MED ORDER — PROCHLORPERAZINE EDISYLATE 10 MG/2ML IJ SOLN: 10.0000 mg | INTRAMUSCULAR | Status: DC | PRN

## 2023-03-25 MED ORDER — FUROSEMIDE 10 MG/ML IJ SOLN
40.0000 mg | Freq: Once | INTRAMUSCULAR | Status: AC
  Administered 2023-03-25: 40 mg via INTRAVENOUS
  Filled 2023-03-25: qty 4

## 2023-03-25 NOTE — ED Triage Notes (Addendum)
Pt arrives via POV. Pt reports worsening sob over the past couple of days. He has been intermittently having to use supplemental o2 at home. He reports the SOB worsens with exertion. Reports his spo2 has dropped into the 80s at home. During triage, spo2 is 80 on  RA and hr is in the 140s. Pt is AxOx4.  He reports recent hospitalization for cellulitis.

## 2023-03-25 NOTE — ED Notes (Signed)
ED TO INPATIENT HANDOFF REPORT  ED Nurse Name and Phone #: Suzanna Obey [phone]  S Name/Age/Gender Willie Steele 42 y.o. male Room/Bed: WA07/WA07  Code Status   Code Status: Prior  Home/SNF/Other Home Patient oriented to: self, place, time, and situation Is this baseline? Yes   Triage Complete: Triage complete  Chief Complaint Atrial flutter with rapid ventricular response (HCC) [I48.92]  Triage Note Pt arrives via POV. Pt reports worsening sob over the past couple of days. He has been intermittently having to use supplemental o2 at home. He reports the SOB worsens with exertion. Reports his spo2 has dropped into the 80s at home. During triage, spo2 is 80 on  RA and hr is in the 140s. Pt is AxOx4.  He reports recent hospitalization for cellulitis.    Allergies Allergies  Allergen Reactions   Sulfa Antibiotics Hives and Rash    Level of Care/Admitting Diagnosis ED Disposition     ED Disposition  Admit   Condition  --   Comment  Hospital Area: [facility] [[id number]]  Level of Care: Stepdown [14]  Admit to SDU based on following criteria: Cardiac Instability:  Patients experiencing chest pain, unconfirmed MI and stable, arrhythmias and CHF requiring medical management and potentially compromising patient's stability  May place patient in observation at [facility] or [facility] if equivalent level of care is available:: No  Covid Evaluation: Asymptomatic - no recent exposure (last 10 days) testing not required  Diagnosis: Atrial flutter with rapid ventricular response [facility] [[id number]]  Admitting Physician: Bobette Mo [[id number]]  Attending Physician: Bobette Mo [[id number]]          B Medical/Surgery History Past Medical History:  Diagnosis Date   Abscess of right thigh    Chronic diastolic (congestive) heart failure (HCC)    Hypertension    Morbid obesity (HCC)    History reviewed. No pertinent surgical history.   A IV  Location/Drains/Wounds Patient Lines/Drains/Airways Status     Active Line/Drains/Airways     Name Placement date Placement time Site Days   Peripheral IV 03/25/23 20 G 1" Left Antecubital 03/25/23  0907  Antecubital  less than 1   Peripheral IV 03/25/23 20 G 1" Anterior;Distal;Right;Upper Arm 03/25/23  1057  Arm  less than 1   Wound / Incision (Open or Dehisced) 03/04/23 Non-pressure wound Pretibial Right pink, yellow, pale 17cm by 8 cm, weeping 03/04/23  2000  Pretibial  21            Intake/Output Last 24 hours No intake or output data in the 24 hours ending 03/25/23 1151  Labs/Imaging Results for orders placed or performed during the hospital encounter of 03/25/23 (from the past 48 hour(s))  Blood Culture (routine x 2)     Status: None (Preliminary result)   Collection Time: 03/25/23  9:00 AM   Specimen: BLOOD LEFT ARM  Result Value Ref Range   Specimen Description      BLOOD LEFT ARM Performed at [facility], [address], [place], [place] [address]    Special Requests      BOTTLES DRAWN AEROBIC AND ANAEROBIC Blood Culture adequate volume Performed at [facility], [address], [place], [place] [address]    Culture PENDING    Report Status PENDING   Resp panel by RT-PCR (RSV, Flu A&B, Covid) Nasal Mucosa     Status: None   Collection Time: 03/25/23  9:03 AM   Specimen: Nasal Mucosa; Nasal Swab  Result Value Ref Range   SARS Coronavirus 2 by RT PCR NEGATIVE NEGATIVE    Comment: (NOTE) SARS-CoV-2 target nucleic acids are NOT DETECTED.  The SARS-CoV-2 RNA is generally detectable in upper respiratory specimens during the acute phase of infection. The lowest concentration of SARS-CoV-2 viral copies this assay can detect is 138 copies/mL. A negative result does not preclude SARS-Cov-2 infection and should not be used as the sole basis for treatment or other patient management decisions. A negative result may occur with  improper specimen  collection/handling, submission of specimen other than nasopharyngeal swab, presence of viral mutation(s) within the areas targeted by this assay, and inadequate number of viral copies(<138 copies/mL). A negative result must be combined with clinical observations, patient history, and epidemiological information. The expected result is Negative.  Fact Sheet for Patients:  BloggerCourse.com  Fact Sheet for Healthcare Providers:  SeriousBroker.it  This test is no t yet approved or cleared by the [place] FDA and  has been authorized for detection and/or diagnosis of SARS-CoV-2 by FDA under an Emergency Use Authorization (EUA). This EUA will remain  in effect (meaning this test can be used) for the duration of the COVID-19 declaration under Section 564(b)(1) of the Act, 21 U.S.C.section 360bbb-3(b)(1), unless the authorization is terminated  or revoked sooner.       Influenza A by PCR NEGATIVE NEGATIVE   Influenza B by PCR NEGATIVE NEGATIVE    Comment: (NOTE) The Xpert Xpress SARS-CoV-2/FLU/RSV plus assay is intended as an aid in the diagnosis of influenza from Nasopharyngeal swab specimens and should not be used as a sole basis for treatment. Nasal washings and aspirates are unacceptable for Xpert Xpress SARS-CoV-2/FLU/RSV testing.  Fact Sheet for Patients: BloggerCourse.com  Fact Sheet for Healthcare Providers: SeriousBroker.it  This test is not yet approved or cleared by the [place] FDA and has been authorized for detection and/or diagnosis of SARS-CoV-2 by FDA under an Emergency Use Authorization (EUA). This EUA will remain in effect (meaning this test can be used) for the duration of the COVID-19 declaration under Section 564(b)(1) of the Act, 21 U.S.C. section 360bbb-3(b)(1), unless the authorization is terminated or revoked.     Resp Syncytial Virus by PCR  NEGATIVE NEGATIVE    Comment: (NOTE) Fact Sheet for Patients: BloggerCourse.com  Fact Sheet for Healthcare Providers: SeriousBroker.it  This test is not yet approved or cleared by the [place] FDA and has been authorized for detection and/or diagnosis of SARS-CoV-2 by FDA under an Emergency Use Authorization (EUA). This EUA will remain in effect (meaning this test can be used) for the duration of the COVID-19 declaration under Section 564(b)(1) of the Act, 21 U.S.C. section 360bbb-3(b)(1), unless the authorization is terminated or revoked.  Performed at [facility], [address] [place], [place] [address]   CBC with Differential     Status: Abnormal   Collection Time: 03/25/23  9:07 AM  Result Value Ref Range   WBC 9.9 4.0 - 10.5 K/uL   RBC 4.63 4.22 - 5.81 MIL/uL   Hemoglobin 13.8 13.0 - 17.0 g/dL   HCT 60.4 54.0 - 98.1 %   MCV 98.7 80.0 - 100.0 fL   MCH 29.8 26.0 - 34.0 pg   MCHC 30.2 30.0 - 36.0 g/dL   RDW 19.1 (H) 47.8 - 29.5 %   Platelets 262 150 - 400 K/uL   nRBC 0.0 0.0 - 0.2 %   Neutrophils Relative % 78 %   Neutro Abs 7.8 (H)  1.7 - 7.7 K/uL   Lymphocytes Relative 15 %   Lymphs Abs 1.5 0.7 - 4.0 K/uL   Monocytes Relative 5 %   Monocytes Absolute 0.5 0.1 - 1.0 K/uL   Eosinophils Relative 1 %   Eosinophils Absolute 0.1 0.0 - 0.5 K/uL   Basophils Relative 0 %   Basophils Absolute 0.0 0.0 - 0.1 K/uL   Immature Granulocytes 1 %   Abs Immature Granulocytes 0.05 0.00 - 0.07 K/uL    Comment: Performed at [facility], [address], [place], [place] [address]  Comprehensive metabolic panel     Status: Abnormal   Collection Time: 03/25/23  9:07 AM  Result Value Ref Range   Sodium 137 135 - 145 mmol/L   Potassium 3.7 3.5 - 5.1 mmol/L   Chloride 103 98 - 111 mmol/L   CO2 24 22 - 32 mmol/L   Glucose, Bld 133 (H) 70 - 99 mg/dL    Comment: Glucose reference range applies  only to samples taken after fasting for at least 8 hours.   BUN 21 (H) 6 - 20 mg/dL   Creatinine, Ser 6.96 0.61 - 1.24 mg/dL   Calcium 9.0 8.9 - 29.5 mg/dL   Total Protein 8.0 6.5 - 8.1 g/dL   Albumin 3.4 (L) 3.5 - 5.0 g/dL   AST 24 15 - 41 U/L   ALT 38 0 - 44 U/L   Alkaline Phosphatase 68 38 - 126 U/L   Total Bilirubin 0.6 <1.2 mg/dL   GFR, Estimated >28 >41 mL/min    Comment: (NOTE) Calculated using the CKD-EPI Creatinine Equation (2021)    Anion gap 10 5 - 15    Comment: Performed at [facility] [address], [place], [place] [address]  Brain natriuretic peptide     Status: None   Collection Time: 03/25/23  9:07 AM  Result Value Ref Range   B Natriuretic Peptide 73.0 0.0 - 100.0 pg/mL    Comment: Performed at [facility], [address] [place], [place] [address]  Troponin I (High Sensitivity)     Status: Abnormal   Collection Time: 03/25/23  9:07 AM  Result Value Ref Range   Troponin I (High Sensitivity) 26 (H) <18 ng/L    Comment: (NOTE) Elevated high sensitivity troponin I (hsTnI) values and significant  changes across serial measurements may suggest ACS but many other  chronic and acute conditions are known to elevate hsTnI results.  Refer to the "Links" section for chest pain algorithms and additional  guidance. Performed at [facility], [address], [place], [place] [address]   MRSA Next Gen by PCR, Nasal     Status: None   Collection Time: 03/25/23  9:11 AM   Specimen: Nasal Mucosa; Nasal Swab  Result Value Ref Range   MRSA by PCR Next Gen NOT DETECTED NOT DETECTED    Comment: (NOTE) The GeneXpert MRSA Assay (FDA approved for NASAL specimens only), is one component of a comprehensive MRSA colonization surveillance program. It is not intended to diagnose MRSA infection nor to guide or monitor treatment for MRSA infections. Test performance is not FDA approved in patients less than 55  years old. Performed at [facility], [address], [place], [place] [address]   I-Stat Lactic Acid, ED     Status: None   Collection Time: 03/25/23  9:17 AM  Result Value Ref Range   Lactic Acid, Venous 1.7 0.5 - 1.9 mmol/L  I-Stat Lactic Acid, ED     Status: None   Collection Time: 03/25/23 11:09 AM  Result Value Ref Range   Lactic Acid, Venous 1.4 0.5 - 1.9 mmol/L   DG Chest Portable 1 View  Result Date: 03/25/2023 CLINICAL DATA:  Shortness of breath EXAM: PORTABLE CHEST 1 VIEW COMPARISON:  03/04/2023 FINDINGS: Limited exam because of portable technique and body habitus. Heart is enlarged with central vascular congestion. Suspect mild bibasilar atelectasis. No large effusion or pneumothorax. Trachea midline. Monitor leads overlie the chest. Overall stable exam. IMPRESSION: Stable chest exam.  No interval change by plain radiography Electronically Signed   By: Judie Petit.  Shick M.D.   On: 03/25/2023 10:38    Pending Labs Unresulted Labs (From admission, onward)     Start     Ordered   03/25/23 1830  Heparin level (unfractionated)  Once-Timed,   TIMED        03/25/23 1138   03/25/23 1139  Magnesium  Add-on,   AD        03/25/23 1138   03/25/23 1139  Phosphorus  Add-on,   AD        03/25/23 1138   03/25/23 1121  APTT  ONCE - STAT,   STAT        03/25/23 1121   03/25/23 1121  Protime-INR  ONCE - STAT,   STAT        03/25/23 1121   03/25/23 0903  Blood Culture (routine x 2)  (Septic presentation on arrival (screening labs, nursing and treatment orders for obvious sepsis))  BLOOD CULTURE X 2,   STAT      03/25/23 0902   03/25/23 0903  Urinalysis, w/ Reflex to Culture (Infection Suspected) -Urine, Catheterized  (Septic presentation on arrival (screening labs, nursing and treatment orders for obvious sepsis))  ONCE - URGENT,   URGENT       Question:  Specimen Source  Answer:  Urine, Catheterized   03/25/23 0902            Vitals/Pain Today's Vitals   03/25/23  0831 03/25/23 0832 03/25/23 0833 03/25/23 0834  BP:   (!) 139/95   Pulse: (!) 140     Resp: (!) 25     Temp: 97.7 F (36.5 C)     TempSrc: Oral     SpO2: (!) 80% 90%    Weight:    (!) 294.8 kg  Height:    6\' 2"  (1.88 m)  PainSc:   0-No pain     Isolation Precautions No active isolations  Medications Medications  vancomycin (VANCOREADY) IVPB 2000 mg/400 mL (2,000 mg Intravenous Not Given 03/25/23 1103)    And  vancomycin (VANCOREADY) IVPB 500 mg/100 mL (500 mg Intravenous Not Given 03/25/23 1111)  furosemide (LASIX) injection 40 mg (has no administration in time range)  diltiazem (CARDIZEM) 1 mg/mL load via infusion 20 mg (20 mg Intravenous Bolus from Bag 03/25/23 1133)    And  diltiazem (CARDIZEM) 125 mg in dextrose 5% 125 mL (1 mg/mL) infusion (5 mg/hr Intravenous New Bag/Given 03/25/23 1126)  heparin ADULT infusion 100 units/mL (25000 units/253mL) (1,500 Units/hr Intravenous New Bag/Given 03/25/23 1149)  potassium chloride SA (KLOR-CON M) CR tablet 40 mEq (has no administration in time range)  ceFEPIme (MAXIPIME) 2 g in sodium chloride 0.9 % 100 mL IVPB (0 g Intravenous Stopped 03/25/23 1112)  metroNIDAZOLE (FLAGYL) IVPB 500 mg (0 mg Intravenous Stopped 03/25/23 1049)  heparin bolus via infusion 6,000 Units (6,000 Units Intravenous Bolus from  Bag 03/25/23 1150)    Mobility walks     Focused Assessments N/A   R Recommendations: See Admitting Provider Note  Report given to:   Additional Notes: N/A

## 2023-03-25 NOTE — ED Provider Notes (Signed)
[place] EMERGENCY DEPARTMENT AT [facility] Provider Note   CSN: [id number] Arrival date & time: 03/25/23  3474     History  Chief Complaint  Patient presents with   Shortness of Breath    Willie Steele is a 42 y.o. male, history of OSA, diastolic CHF, who presents to the ED secondary to severe shortness of breath, that began 24 hours ago.  He he states that yesterday he noticed that he was having severe shortness of breath, typically is on 2 to 3 L of oxygen, but last night his oxygen was dropping in the 80s.  Notes that he has last week, since being on diuretics after being hospitalized, for cellulitis, and getting CHF treated, but that for the last 24 hours he has been very winded, and cannot catch his breath.  States he cannot even walk 10 feet, without feeling like he is going to pass out.  Denies feeling like his heart is racing, or having any chest pain.  He denies any fevers or chills.  States that he had cellulitis, up on his left lower extremity, last week, but has had it resolved, after taking antibiotics outpatient.     Home Medications Prior to Admission medications   Medication Sig Start Date End Date Taking? Authorizing Provider  acetaminophen (TYLENOL) 500 MG tablet Take 1,500 mg by mouth as needed for moderate pain (pain score 4-6).   Yes [provider]  linezolid (ZYVOX) 600 MG tablet Take 600 mg by mouth 2 (two) times daily. 03/19/23 03/29/23 Yes [provider]  losartan (COZAAR) 100 MG tablet Take 100 mg by mouth daily.   Yes [provider]  Melatonin 10 MG TABS Take 10 mg by mouth at bedtime.   Yes [provider]  torsemide (DEMADEX) 20 MG tablet Take 2 tablets (40 mg total) by mouth daily. Patient taking differently: Take 20 mg by mouth 2 (two) times daily. 03/10/23  Yes Rodolph Bong, MD  meloxicam (MOBIC) 15 MG tablet TAKE 1 TABLET BY MOUTH EVERY DAY AS NEEDED FOR PAIN Patient not taking: Reported on  03/25/2023 02/03/23   Kirtland Bouchard, PA-C      Allergies    Sulfa antibiotics    Review of Systems   Review of Systems  Respiratory:  Positive for shortness of breath.   Cardiovascular:  Negative for chest pain.    Physical Exam Updated Vital Signs BP (!) 139/95   Pulse (!) 140   Temp 97.7 F (36.5 C) (Oral)   Resp (!) 25   Ht 6\' 2"  (1.88 m)   Wt (!) 294.8 kg   SpO2 90%   BMI 83.46 kg/m  Physical Exam Vitals and nursing note reviewed.  Constitutional:      General: He is not in acute distress.    Appearance: He is well-developed. He is obese.  HENT:     Head: Normocephalic and atraumatic.  Eyes:     Conjunctiva/sclera: Conjunctivae normal.  Cardiovascular:     Rate and Rhythm: Normal rate and regular rhythm.     Heart sounds: No murmur heard. Pulmonary:     Effort: Pulmonary effort is normal.     Comments: +diminished breath sounds, dyspneic at rest, 4L O2 Abdominal:     Palpations: Abdomen is soft.     Tenderness: There is no abdominal tenderness.  Musculoskeletal:        General: No swelling.     Cervical back: Neck supple.  Skin:    General:  Skin is warm and dry.     Capillary Refill: Capillary refill takes less than 2 seconds.  Neurological:     Mental Status: He is alert.  Psychiatric:        Mood and Affect: Mood normal.     ED Results / Procedures / Treatments   Labs (all labs ordered are listed, but only abnormal results are displayed) Labs Reviewed  CBC WITH DIFFERENTIAL/PLATELET - Abnormal; Notable for the following components:      Result Value   RDW 16.8 (*)    Neutro Abs 7.8 (*)    All other components within normal limits  COMPREHENSIVE METABOLIC PANEL - Abnormal; Notable for the following components:   Glucose, Bld 133 (*)    BUN 21 (*)    Albumin 3.4 (*)    All other components within normal limits  TROPONIN I (HIGH SENSITIVITY) - Abnormal; Notable for the following components:   Troponin I (High Sensitivity) 26 (*)    All  other components within normal limits  TROPONIN I (HIGH SENSITIVITY) - Abnormal; Notable for the following components:   Troponin I (High Sensitivity) 38 (*)    All other components within normal limits  RESP PANEL BY RT-PCR (RSV, FLU A&B, COVID)  [id number]  CULTURE, BLOOD (ROUTINE X 2)  MRSA NEXT GEN BY PCR, NASAL  CULTURE, BLOOD (ROUTINE X 2)  BRAIN NATRIURETIC PEPTIDE  MAGNESIUM  PHOSPHORUS  URINALYSIS, W/ REFLEX TO CULTURE (INFECTION SUSPECTED)  HEPARIN LEVEL (UNFRACTIONATED)  PROTIME-INR  APTT  I-STAT CG4 LACTIC ACID, ED  I-STAT CG4 LACTIC ACID, ED    EKG EKG Interpretation Date/Time:  Wednesday March 25 2023 08:32:27 EST Ventricular Rate:  139 PR Interval:    QRS Duration:  179 QT Interval:  384 QTC Calculation: 587 R Axis:   8  Text Interpretation: Atrial flutter with varied AV block, Right bundle branch block Abnormal T, consider ischemia, lateral leads Confirmed by Estanislado Pandy [phone]) on 03/25/2023 9:07:39 AM  Radiology DG Chest Portable 1 View  Result Date: 03/25/2023 CLINICAL DATA:  Shortness of breath EXAM: PORTABLE CHEST 1 VIEW COMPARISON:  03/04/2023 FINDINGS: Limited exam because of portable technique and body habitus. Heart is enlarged with central vascular congestion. Suspect mild bibasilar atelectasis. No large effusion or pneumothorax. Trachea midline. Monitor leads overlie the chest. Overall stable exam. IMPRESSION: Stable chest exam.  No interval change by plain radiography Electronically Signed   By: Judie Petit.  Shick M.D.   On: 03/25/2023 10:38    Procedures Procedures    Medications Ordered in ED Medications  vancomycin (VANCOREADY) IVPB 2000 mg/400 mL (2,000 mg Intravenous Not Given 03/25/23 1103)    And  vancomycin (VANCOREADY) IVPB 500 mg/100 mL (500 mg Intravenous Not Given 03/25/23 1111)  furosemide (LASIX) injection 40 mg (has no administration in time range)  diltiazem (CARDIZEM) 1 mg/mL load via infusion 20 mg (20 mg Intravenous Bolus from Bag  03/25/23 1133)    And  diltiazem (CARDIZEM) 125 mg in dextrose 5% 125 mL (1 mg/mL) infusion (5 mg/hr Intravenous New Bag/Given 03/25/23 1126)  heparin ADULT infusion 100 units/mL (25000 units/237mL) (1,500 Units/hr Intravenous New Bag/Given 03/25/23 1149)  potassium chloride SA (KLOR-CON M) CR tablet 40 mEq (has no administration in time range)  ceFEPIme (MAXIPIME) 2 g in sodium chloride 0.9 % 100 mL IVPB (0 g Intravenous Stopped 03/25/23 1112)  metroNIDAZOLE (FLAGYL) IVPB 500 mg (0 mg Intravenous Stopped 03/25/23 1049)  heparin bolus via infusion 6,000 Units (6,000 Units Intravenous Bolus from Bag 03/25/23  1150)    ED Course/ Medical Decision Making/ A&P                                 Medical Decision Making Patient is a 42 year old male, history of CHF, who presents to the ED secondary to shortness of breath is been going on for the last 24 hours.  He denies any fevers, chills.  States he was treated for cellulitis a few weeks ago, and it came back last week, but has resolved now.  He states the shortness of breath, was acute yesterday, and he has been more short of breath, and exertion particularly.  He is having a hard time walking 10+ feet.  We will obtain EKG, chest x-ray, BNP, lactic acid, given that the patient is tachycardic, will also initiate sepsis protocol, as the patient is hypoxic, and requiring further oxygen.  And is respiratory rate is elevated.  He had a history of cellulitis, so we will do undetermined etiology for sepsis.  His lungs are greatly diminished, however this may be secondary to body habitus.  Will increase oxygen to 4 L, at this time, for comfort, and further oxygenation.  Hold off on fluids or Lasix, until chest x-ray completed to determine if fluid overloaded versus depleted  Amount and/or Complexity of Data Reviewed Labs: ordered.    Details: Labs within normal limits Radiology: ordered.    Details: Chest x-ray, shows pulmonary congestion, but no acute  findings ECG/medicine tests: ordered.    Details: A flutter with rapid rate Discussion of management or test interpretation with external provider(s): Patient has a large body habitus thus unable to CT patient, as he is around 5 to 600 pounds.  He is in a flutter, I discussed this with Dr. [place], who agrees with starting heparin, and Cardizem, as he has no history of a flutter.  He also had an echocardiogram in his last hospitalization, but there was poor visualization due to body habitus.  He does have pulmonary congestion, and chest x-ray, thus we will give him 1 dose of Lasix.  Do not believe that his tachycardia is likely related to an infection, as he has no evidence of pneumonia has not any fever, chills, and he states his cellulitis is resolving, on my exam, I do not see any skin changes, that are new per patient.  Admitted to Dr. Robb Matar, for further management.  Cardiology consult as needed.  Per Dr. [place], no need to move to [facility]  Risk Prescription drug management. Decision regarding hospitalization.    Final Clinical Impression(s) / ED Diagnoses Final diagnoses:  Atrial flutter, unspecified type (HCC)  Dyspnea, unspecified type  Pulmonary congestion    Rx / DC Orders ED Discharge Orders     None         Agustine Rossitto Elbert Ewings, PA 03/25/23 1215    Coral Spikes, DO 03/25/23 1531

## 2023-03-25 NOTE — Progress Notes (Addendum)
    Patient Name: Willie Steele           DOB: 04-05-1981  MRN: [id number]      Admission Date: 03/25/2023  Attending Provider: Bobette Mo, MD  Primary Diagnosis: Atrial flutter with rapid ventricular response [facility])   Level of care: Stepdown    CROSS COVER NOTE   Date of Service   03/25/2023   Willie Steele, 42 y.o. male, was admitted on 03/25/2023 for Atrial flutter with rapid ventricular response (HCC).    HPI/Events of Note   Notified by bedside RN of patient in respiratory distress.  Patient experiencing dyspnea on exertion and is now mildly hypoxia, increased work of breathing.  Breath sounds diminished, no wheezing or rhonchi. Respiratory therapy at bedside.  Patient has history of CPAP use for OSA.  Given body habitus and respiratory distress, would recommend trialing BiPAP tonight for extra PEEP support.    Interventions/ Plan   BiPAP Xopenex as needed        Anthoney Harada, DNP, [employer] [facility]

## 2023-03-25 NOTE — Progress Notes (Signed)
PHARMACY - ANTICOAGULATION CONSULT NOTE  Pharmacy Consult for IV heparin - transition to apixaban Indication: atrial fibrillation  Allergies  Allergen Reactions   Sulfa Antibiotics Hives and Rash    Patient Measurements: Height: 6\' 2"  (188 cm) Weight: (!) 294.8 kg (650 lb) IBW/kg (Calculated) : 82.2 Heparin Dosing Weight: 160.4 kg  Vital Signs: Temp: 97.7 F (36.5 C) (12/11 0831) Temp Source: Oral (12/11 0831) BP: 139/95 (12/11 0833) Pulse Rate: 140 (12/11 0831)  Labs: Recent Labs    03/25/23 0907 03/25/23 1100 03/25/23 1236  HGB 13.8  --   --   HCT 45.7  --   --   PLT 262  --   --   APTT  --   --  49*  LABPROT  --   --  16.2*  INR  --   --  1.3*  CREATININE 0.94  --   --   TROPONINIHS 26* 38*  --     Estimated Creatinine Clearance: 244.6 mL/min (by C-G formula based on SCr of 0.94 mg/dL).   Medical History: Past Medical History:  Diagnosis Date   Abscess of right thigh    Chronic diastolic (congestive) heart failure (HCC)    Hypertension    Morbid obesity (HCC)     Medications:  No prior to admission anticoagulation medications listed  Assessment: Pharmacy consulted to dose IV heparin for this 42 yo male with medical history significant for hypertension, super morbid obesity, diastolic congestive heart failure, and recent treatment for cellulitis being admitted to the hospital with severe shortness of breath.  12/11 EKG: atrial flutter with varied AV block, right bundle branch block, abnormal T, consider ischemia, lateral leads 12/11 0907 Troponin elevated at 26 12/11 1100 Troponin in process  Per cards, ok to transition from IV heparin to PO apixaban this evening  Goal of Therapy:  Heparin level 0.3-0.7 units/ml Monitor platelets by anticoagulation protocol: Yes   Plan:  D/C IV heparin at 1800 Start PO apixaban 5mg  BID at 1800 Monitor daily heparin level, CBC, signs/symptoms of bleeding   Thank you for allowing pharmacy to be a part of this  patient's care.  Hessie Knows, PharmD, BCPS Secure Chat if ?s 03/25/2023 2:11 PM

## 2023-03-25 NOTE — ED Notes (Signed)
Waiting to place pure wick before administering lasix per pt's request

## 2023-03-25 NOTE — Progress Notes (Signed)
ED Pharmacy Antibiotic Sign Off An antibiotic consult was received from an ED provider for vancomycin & cefepime per pharmacy dosing for cellulitis. A chart review was completed to assess appropriateness.   The following one time order(s) were placed:  Vancomycin 2 gm & 500 mg to make total dose = 2500 mg; cefepime 2 gm, flagyl 500 x 1   Further antibiotic and/or antibiotic pharmacy consults should be ordered by the admitting provider if indicated.   Thank you for allowing pharmacy to be a part of this patient's care.    Herby Abraham, Pharm.D Use secure chat for questions 03/25/2023 9:16 AM Clinical Pharmacist 03/25/23 9:15 AM

## 2023-03-25 NOTE — Progress Notes (Signed)
PHARMACY - ANTICOAGULATION CONSULT NOTE  Pharmacy Consult for IV heparin Indication: atrial fibrillation  Allergies  Allergen Reactions   Sulfa Antibiotics Hives and Rash    Patient Measurements: Height: 6\' 2"  (188 cm) Weight: (!) 294.8 kg (650 lb) IBW/kg (Calculated) : 82.2 Heparin Dosing Weight: 160.4 kg  Vital Signs: Temp: 97.7 F (36.5 C) (12/11 0831) Temp Source: Oral (12/11 0831) BP: 139/95 (12/11 0833) Pulse Rate: 140 (12/11 0831)  Labs: Recent Labs    03/25/23 0907  HGB 13.8  HCT 45.7  PLT 262  CREATININE 0.94  TROPONINIHS 26*    Estimated Creatinine Clearance: 244.6 mL/min (by C-G formula based on SCr of 0.94 mg/dL).   Medical History: Past Medical History:  Diagnosis Date   Abscess of right thigh    Chronic diastolic (congestive) heart failure (HCC)    Hypertension    Morbid obesity (HCC)     Medications:  No prior to admission anticoagulation medications listed  Assessment: Pharmacy consulted to dose IV heparin for this 42 yo male with medical history significant for hypertension, super morbid obesity, diastolic congestive heart failure, and recent treatment for cellulitis being admitted to the hospital with severe shortness of breath.  12/11 EKG: atrial flutter with varied AV block, right bundle branch block, abnormal T, consider ischemia, lateral leads 12/11 0907 Troponin elevated at 26 12/11 1100 Troponin in process  Goal of Therapy:  Heparin level 0.3-0.7 units/ml Monitor platelets by anticoagulation protocol: Yes   Plan:  Initiate heparin with 6000 unit IV bolus via infusion then 1500 units/hr IV continuous infusion 6 hour heparin level Monitor daily heparin level, CBC, signs/symptoms of bleeding   Thank you for allowing pharmacy to be a part of this patient's care.  Selinda Eon, PharmD, BCPS Clinical Pharmacist [place] 03/25/2023 11:30 AM

## 2023-03-25 NOTE — H&P (Signed)
History and Physical    Patient: Willie Steele ZOX:[id number] DOB: 05-24-1980 DOA: 03/25/2023 DOS: the patient was seen and examined on 03/25/2023 PCP: Tracey Harries, MD  Patient coming from: Home  Chief Complaint:  Chief Complaint  Patient presents with   Shortness of Breath   HPI: Willie Steele is a 42 y.o. male with medical history significant of chronic diastolic heart failure, hypertension, prediabetes, hypertriglyceridemia, metabolic syndrome, vitamin D deficiency, OSA on CPAP, class III obesity with a current BMI of 83.46 kg/m who was recently admitted and discharged due to sepsis in the setting of cellulitis who is coming today to the emergency department due to progressively worse dyspnea worsened by exertion associated with dizziness.  He has stage IV lymphedema with bilateral skin changes so it is difficult for him to know if he is retaining fluid.  He recently had a flare of the elbow his cellulitic area, but improved after he resumed linezolid.  He denied fever, chills, rhinorrhea, sore throat, wheezing or hemoptysis.  No chest pain, palpitations, diaphoresis, PND, but has orthopnea.  No abdominal pain, nausea, emesis, diarrhea, constipation, melena or hematochezia.  No flank pain, dysuria, frequency or hematuria.  No polyuria, polydipsia, polyphagia or blurred vision.   Lab work: CBC showed white count 9.9, hemoglobin 13.8 g/dL platelets 811.  Troponin 26 then 38 ng/L and BNP 73.0 pg/mL.  CMP showed an albumin of 3.4 g/dL, a glucose of 914 and BUN of 21 mg/deciliter.  The rest of the CMP measurements were normal.  Hemoglobin A1c was 6.0%.  TSH was 1.421 IU/mL.  Imaging: Portable 1 view chest radiograph was limited due to portable technique and body habitus.  Enlarged heart with central vascular congestion.  Suspect mild bibasilar atelectasis.  ED course: Initial vital signs were temperature 97.7 F, pulse 140, respiration 25, BP 139/95 mmHg O2 sat 80% on room air.  Most recent sat  was 95% on 6 L/min.  The patient received cefepime, vancomycin, Flagyl furosemide 40 mg IVP,  started on heparin and diltiazem infusion.  I added K-Lor 40 mill equivalents p.o. and magnesium sulfate 2 g IVPB.   Review of Systems: As mentioned in the history of present illness. All other systems reviewed and are negative. Past Medical History:  Diagnosis Date   Abscess of right thigh    Chronic diastolic (congestive) heart failure (HCC)    Hypertension    Morbid obesity (HCC)    History reviewed. No pertinent surgical history. Social History:  reports that he quit smoking about 11 years ago. His smoking use included cigarettes. He has never used smokeless tobacco. He reports current alcohol use. He reports that he does not use drugs.  Allergies  Allergen Reactions   Sulfa Antibiotics Hives and Rash    Family History  Problem Relation Age of Onset   Bowel Disease Father        colon blockage    Prior to Admission medications   Medication Sig Start Date End Date Taking? Authorizing Provider  acetaminophen (TYLENOL) 500 MG tablet Take 2,000 mg by mouth daily as needed for moderate pain.    [provider]  losartan (COZAAR) 100 MG tablet Take 100 mg by mouth daily.    [provider]  meloxicam (MOBIC) 15 MG tablet TAKE 1 TABLET BY MOUTH EVERY DAY AS NEEDED FOR PAIN Patient taking differently: Take 15 mg by mouth daily. 02/03/23   Kirtland Bouchard, PA-C  OVER THE COUNTER MEDICATION Take 2 tablets by mouth at bedtime. Zquil  [provider]  torsemide (DEMADEX) 20 MG tablet Take 2 tablets (40 mg total) by mouth daily. 03/10/23   Rodolph Bong, MD    Physical Exam: Vitals:   03/25/23 0831 03/25/23 0832 03/25/23 0833 03/25/23 0834  BP:   (!) 139/95   Pulse: (!) 140     Resp: (!) 25     Temp: 97.7 F (36.5 C)     TempSrc: Oral     SpO2: (!) 80% 90%    Weight:    (!) 294.8 kg  Height:    6\' 2"  (1.88 m)   Physical Exam Vitals and nursing note  reviewed.  Constitutional:      General: He is awake. He is not in acute distress.    Appearance: He is morbidly obese. He is ill-appearing.     Interventions: Nasal cannula in place.  HENT:     Head: Normocephalic.     Nose: No rhinorrhea.     Mouth/Throat:     Mouth: Mucous membranes are moist.  Eyes:     General: No scleral icterus.    Pupils: Pupils are equal, round, and reactive to light.  Neck:     Vascular: No JVD.  Cardiovascular:     Rate and Rhythm: Tachycardia present. Rhythm regularly irregular.     Heart sounds: S1 normal and S2 normal.     Comments: Stage IV lymphedema (bilateral elephantiasis of both lower extremities). Pulmonary:     Breath sounds: Examination of the right-lower field reveals decreased breath sounds. Examination of the left-lower field reveals decreased breath sounds. Decreased breath sounds present. No wheezing, rhonchi or rales.  Abdominal:     General: Bowel sounds are normal. There is no distension.     Palpations: Abdomen is soft.     Tenderness: There is no abdominal tenderness.  Musculoskeletal:     Cervical back: Neck supple.     Right lower leg: 4+ Edema present.     Left lower leg: 4+ Edema present.  Skin:    General: Skin is warm and dry.  Neurological:     General: No focal deficit present.     Mental Status: He is alert and oriented to person, place, and time.  Psychiatric:        Mood and Affect: Mood normal.        Behavior: Behavior normal. Behavior is cooperative.     Data Reviewed:  Results are pending, will review when available.  03/05/2023 transthoracic echocardiogram: IMPRESSIONS:   1. Cannot assess LVF or wall motion due to inability to visualize endocardial segments from poor acoustical windows. Left ventricular endocardial border not optimally defined to evaluate regional wall motion. Left ventricular diastolic function could not be evaluated.  2. Right ventricular systolic function was not well visualized.  The right ventricular size is not well visualized. There is normal pulmonary artery systolic pressure. The estimated right ventricular systolic pressure is 26.2 mmHg.  3. The mitral valve is normal in structure. No evidence of mitral valve regurgitation. No evidence of mitral stenosis.  4. The aortic valve was not well visualized. Aortic valve regurgitation is not visualized.  5. The inferior vena cava is normal in size with greater than 50% respiratory variability, suggesting right atrial pressure of 3 mmHg.  EKG: Vent. rate 139 BPM PR interval * ms QRS duration 179 ms QT/QTcB 384/587 ms P-R-T axes * 8 261 Atrial flutter with varied AV block, Right bundle branch block Abnormal T, consider ischemia, lateral leads  Assessment and Plan: Principal Problem:   Atrial flutter with rapid ventricular response (HCC) Observation/SDU. Continue heparin infusion. Continue diltiazem infusion. Metoprolol 5 mg IVP x 1. Potassium supplementation. Magnesium sulfate 2 g IVPB. Keep electrolytes optimized. TSH level was normal. Cardiology consult appreciated.  Active Problems:   Cardiomegaly Is in the setting of water/diastolic heart failure. Continue treatment as above.    Morbid obesity (HCC) Current BMI 83.46 kg/m. Lifestyle modifications. Follow-up closely PCP and/or bariatric clinic.    OSA on CPAP Continue CPAP at bedtime and as needed.    Prediabetes Hemoglobin A1c 6.0%.    Cellulitis Improved. Continue linezolid 600 mg p.o. twice daily.    Advance Care Planning:   Code Status: Full Code   Consults: Cardiology Epifanio Lesches, MD).  Family Communication:   Severity of Illness: The appropriate patient status for this patient is INPATIENT. Inpatient status is judged to be reasonable and necessary in order to provide the required intensity of service to ensure the patient's safety. The patient's presenting symptoms, physical exam findings, and initial radiographic  and laboratory data in the context of their chronic comorbidities is felt to place them at high risk for further clinical deterioration. Furthermore, it is not anticipated that the patient will be medically stable for discharge from the hospital within 2 midnights of admission.   * I certify that at the point of admission it is my clinical judgment that the patient will require inpatient hospital care spanning beyond 2 midnights from the point of admission due to high intensity of service, high risk for further deterioration and high frequency of surveillance required.*  Author: Bobette Mo, MD 03/25/2023 11:26 AM  For on call review [email or web address].   This document was prepared using Dragon voice recognition software and may contain some unintended transcription errors.

## 2023-03-25 NOTE — Progress Notes (Addendum)
   03/25/23 1940  BiPAP/CPAP/SIPAP  $ Non-Invasive Ventilator  Non-Invasive Vent Initial  $ Face Mask Large  Yes  BiPAP/CPAP/SIPAP Pt Type Adult  BiPAP/CPAP/SIPAP V60  Mask Type Full face mask  Mask Size Large  Set Rate 8 breaths/min  Respiratory Rate 27 breaths/min  IPAP 18 cmH20  EPAP 12 cmH2O  FiO2 (%) 40 %  Peak Inspiratory Pressure (PIP) 19  Tidal Volume (Vt) 678  Patient Home Equipment No  Auto Titrate No  Press High Alarm 30 cmH2O  Press Low Alarm 5 cmH2O  CPAP/SIPAP surface wiped down Yes  BiPAP/CPAP /SiPAP Vitals  Pulse Rate 92  Resp (!) 27  BP (!) 164/102  Bilateral Breath Sounds Diminished   Pt placed on bipap for respiratory distress and increased wob.  Pt stated he can breathe much better with bipap.  HR92, spo2 96%.

## 2023-03-25 NOTE — Progress Notes (Signed)
[facility] is following code sepsis 

## 2023-03-25 NOTE — ED Notes (Signed)
Pt advised he wanted to speak to PA before continuation of antibiotics. PA notified.

## 2023-03-25 NOTE — Progress Notes (Signed)
Patient insisting on not leaving bp cuff on. Patient understands that we need to check bps frequently due to being on a medication that can lower bp. Will spot check bps per patient request.

## 2023-03-25 NOTE — Consult Note (Addendum)
Cardiology Consultation   Patient ID: Willie Steele MRN: [id number]; DOB: Aug 17, 1980  Admit date: 03/25/2023 Date of Consult: 03/25/2023  PCP:  Tracey Harries, MD   [facility] Providers Cardiologist:  None        Patient Profile:   Willie Steele is a 42 y.o. male with a hx of OSA, diastolic CHF who is being seen 03/25/2023 for the evaluation of atrial flutter with RVR at the request of Dr. Robb Matar.  History of Present Illness:   Patient recently discharged on 11/26 after a 6 day admission with sepsis 2/2 cellulitis as well as acute hypoxic respiratory failure, acute on chronic HFpEF. He was diuresed over 25L during this admission and discharged on Demadex 40mg . He reports compliance with all medications, also has continued with nightly CPAP use.   [name] presented to the ED today with severe dyspnea, exertional intolerance, dizziness, rapid HR x24 hours. Patient noted onset of these symptoms yesterday and found that his O2 saturation was dropping into the 80s despite use of normal 2-3L O2. He also reports that his pulse oximetry device reported HR of 140. ED ECG with 2:1 atrial flutter, ventricular rate 139 bpm. Patient started on IV diltiazem and admitted to stepdown unit.   On exam, patient now in 3:1 aflutter with improved rate control on Diltiazem. He is also on a heparin infusion. Reports improvement in breathing and dizziness with better HR control. Denies chest discomfort. He still has exertional intolerance.   Past Medical History:  Diagnosis Date   Abscess of right thigh    Chronic diastolic (congestive) heart failure (HCC)    Hypertension    Morbid obesity (HCC)     History reviewed. No pertinent surgical history.   Home Medications:  Prior to Admission medications   Medication Sig Start Date End Date Taking? Authorizing Provider  acetaminophen (TYLENOL) 500 MG tablet Take 1,500 mg by mouth as needed for moderate pain (pain score 4-6).   Yes [provider]  linezolid (ZYVOX) 600 MG tablet Take 600 mg by mouth 2 (two) times daily. 03/19/23 03/29/23 Yes [provider]  losartan (COZAAR) 100 MG tablet Take 100 mg by mouth daily.   Yes [provider]  Melatonin 10 MG TABS Take 10 mg by mouth at bedtime.   Yes [provider]  torsemide (DEMADEX) 20 MG tablet Take 2 tablets (40 mg total) by mouth daily. Patient taking differently: Take 20 mg by mouth 2 (two) times daily. 03/10/23  Yes Rodolph Bong, MD  meloxicam (MOBIC) 15 MG tablet TAKE 1 TABLET BY MOUTH EVERY DAY AS NEEDED FOR PAIN Patient not taking: Reported on 03/25/2023 02/03/23   Kirtland Bouchard, PA-C    Inpatient Medications: Scheduled Meds:  insulin aspart  0-20 Units Subcutaneous TID WC   potassium chloride  40 mEq Oral Q supper   Continuous Infusions:  diltiazem (CARDIZEM) infusion 5 mg/hr (03/25/23 1200)   heparin 1,500 Units/hr (03/25/23 1200)   magnesium sulfate bolus IVPB     vancomycin     And   vancomycin     PRN Meds: acetaminophen **OR** acetaminophen, prochlorperazine  Allergies:    Allergies  Allergen Reactions   Sulfa Antibiotics Hives and Rash    Social History:   Social History   Socioeconomic History   Marital status: Single    Spouse name: Not on file   Number of children: Not on file   Years of education: Not on file   Highest education level:  Not on file  Occupational History   Not on file  Tobacco Use   Smoking status: Former    Current packs/day: 0.00    Types: Cigarettes    Quit date: 04/13/2011    Years since quitting: 11.9   Smokeless tobacco: Never  Substance and Sexual Activity   Alcohol use: Yes    Comment: every few weeks   Drug use: No   Sexual activity: Not on file  Other Topics Concern   Not on file  Social History Narrative   Not on file   Social Determinants of Health   Financial Resource Strain: Low Risk  (05/08/2022)   Received from [facility], [facility]    Overall Financial Resource Strain (CARDIA)    Difficulty of Paying Living Expenses: Not hard at all  Food Insecurity: No Food Insecurity (03/05/2023)   Hunger Vital Sign    Worried About Running Out of Food in the Last Year: Never true    Ran Out of Food in the Last Year: Never true  Transportation Needs: No Transportation Needs (03/05/2023)   PRAPARE - Administrator, Civil Service (Medical): No    Lack of Transportation (Non-Medical): No  Physical Activity: Inactive (11/12/2021)   [facility]   Exercise Vital Sign    Days of Exercise per Week: 0 days    Minutes of Exercise per Session: 0 min  Stress: Stress Concern Present (11/12/2021)   Received from [facility], [facility], [facility]   [facility] of Occupational Health - Occupational Stress Questionnaire    Feeling of Stress : To some extent  Social Connections: Unknown (11/14/2022)   Received from [facility]   Social Network    Social Network: Not on file  Intimate Partner Violence: Not At Risk (03/05/2023)   Humiliation, Afraid, Rape, and Kick questionnaire    Fear of Current or Ex-Partner: No    Emotionally Abused: No    Physically Abused: No    Sexually Abused: No    Family History:    Family History  Problem Relation Age of Onset   Bowel Disease Father        colon blockage     ROS:  Please see the history of present illness.   All other ROS reviewed and negative.     Physical Exam/Data:   Vitals:   03/25/23 0831 03/25/23 0832 03/25/23 0833 03/25/23 0834  BP:   (!) 139/95   Pulse: (!) 140     Resp: (!) 25     Temp: 97.7 F (36.5 C)     TempSrc: Oral     SpO2: (!) 80% 90%    Weight:    (!) 294.8 kg  Height:    6\' 2"  (1.88 m)    Intake/Output Summary (Last 24 hours) at 03/25/2023 1359 Last data filed at 03/25/2023 1200 Gross per 24 hour  Intake 285.38 ml  Output --  Net 285.38 ml      03/25/2023    8:34 AM 03/09/2023     9:56 AM 03/08/2023    5:00 AM  Last 3 Weights  Weight (lbs) 650 lb 636 lb 0.4 oz --  Weight (kg) 294.838 kg 288.5 kg --     Body mass index is 83.46 kg/m.  General:  Well nourished, well developed, in no acute distress HEENT: normal Neck: Unable to assess JVP due to body habitus Vascular: No carotid bruits; Distal pulses 2+ bilaterally Cardiac:  normal  S1, S2; rapid and regular rhythm. No murmur  Lungs:  diffusely diminished breath sounds with body habitus challenging exam.  Abd: soft, nontender, no hepatomegaly  Ext: bilateral LE edema with chronic skin changes Musculoskeletal:  No deformities, BUE and BLE strength normal and equal Skin: warm and dry  Neuro:  CNs 2-12 intact, no focal abnormalities noted Psych:  Normal affect   EKG:  The EKG was personally reviewed and demonstrates:  atrial flutter with 2:1 conduction, ventricular rate 139bpm. Repeat ECG with 3:1 flutter, HR in low 100s.  Telemetry:  Telemetry was personally reviewed and demonstrates:  atrial flutter with somewhat variable conduction, now consistently 3:1.  Relevant CV Studies:  Cardiac Studies & Procedures       ECHOCARDIOGRAM  ECHOCARDIOGRAM COMPLETE 03/05/2023  Narrative ECHOCARDIOGRAM REPORT    Patient Name:   TAITEN BELISLE Date of Exam: 03/05/2023 Medical Rec #:  [id number]     Height:       74.0 in Accession #:    [id number]    Weight:       675.0 lb Date of Birth:  30-Jul-1980     BSA:          3.645 m Patient Age:    42 years      BP:           142/64 mmHg Patient Gender: M             HR:           89 bpm. Exam Location:  Inpatient  Procedure: 2D Echo, Cardiac Doppler and Color Doppler  Indications:    CHF- Acute Diastolic I50.31  History:        Patient has prior history of Echocardiogram examinations, most recent 05/30/2021. CHF and Cardiomegaly; Risk Factors:Hypertension and Sleep Apnea.  Sonographer:    Lucendia Herrlich RCS Referring Phys: [phone] DANIEL V THOMPSON   Sonographer  Comments: Suboptimal parasternal window, suboptimal subcostal window, no apical window and patient is obese. TDS due to morbid obesity, over 600 pounds. IMPRESSIONS   1. Cannot assess LVF or wall motion due to inability to visualize endocardial segments from poor acoustical windows. Left ventricular endocardial border not optimally defined to evaluate regional wall motion. Left ventricular diastolic function could not be evaluated. 2. Right ventricular systolic function was not well visualized. The right ventricular size is not well visualized. There is normal pulmonary artery systolic pressure. The estimated right ventricular systolic pressure is 26.2 mmHg. 3. The mitral valve is normal in structure. No evidence of mitral valve regurgitation. No evidence of mitral stenosis. 4. The aortic valve was not well visualized. Aortic valve regurgitation is not visualized. 5. The inferior vena cava is normal in size with greater than 50% respiratory variability, suggesting right atrial pressure of 3 mmHg.  FINDINGS Left Ventricle: Left ventricular ejection fraction, by estimation, is Cannot assess due to inability to visualize endocardial segments from poor acoustical windows%. The left ventricle has Cannot assess function. Left ventricular endocardial border not optimally defined to evaluate regional wall motion. The left ventricular internal cavity size was cannot assess. There is no left ventricular hypertrophy. Left ventricular diastolic function could not be evaluated.  Right Ventricle: The right ventricular size is not well visualized. Right vetricular wall thickness was not well visualized. Right ventricular systolic function was not well visualized. There is normal pulmonary artery systolic pressure. The tricuspid regurgitant velocity is 1.67 m/s, and with an assumed right atrial pressure of 15 mmHg, the estimated right ventricular systolic pressure  is 26.2 mmHg.  Left Atrium: Left atrial size was  not well visualized.  Right Atrium: Right atrial size was not well visualized.  Pericardium: There is no evidence of pericardial effusion.  Mitral Valve: The mitral valve is normal in structure. No evidence of mitral valve regurgitation. No evidence of mitral valve stenosis.  Tricuspid Valve: The tricuspid valve is not well visualized. Tricuspid valve regurgitation is trivial. No evidence of tricuspid stenosis.  Aortic Valve: The aortic valve was not well visualized. Aortic valve regurgitation is not visualized.  Pulmonic Valve: The pulmonic valve was not well visualized. Pulmonic valve regurgitation is not visualized. No evidence of pulmonic stenosis.  Aorta: The aortic root is normal in size and structure.  Venous: The inferior vena cava is normal in size with greater than 50% respiratory variability, suggesting right atrial pressure of 3 mmHg.  IAS/Shunts: No atrial level shunt detected by color flow Doppler.   IVC IVC diam: 3.40 cm   AORTA Ao Asc diam: 3.65 cm  TRICUSPID VALVE TR Peak grad:   11.2 mmHg TR Vmax:        167.00 cm/s  Armanda Magic MD Electronically signed by Armanda Magic MD Signature Date/Time: 03/05/2023/1:03:11 PM    Final              Laboratory Data:  High Sensitivity Troponin:   Recent Labs  Lab 03/25/23 0907 03/25/23 1100  TROPONINIHS 26* 38*     Chemistry Recent Labs  Lab 03/25/23 0907 03/25/23 1100  NA 137  --   K 3.7  --   CL 103  --   CO2 24  --   GLUCOSE 133*  --   BUN 21*  --   CREATININE 0.94  --   CALCIUM 9.0  --   MG  --  1.9  GFRNONAA >60  --   ANIONGAP 10  --     Recent Labs  Lab 03/25/23 0907  PROT 8.0  ALBUMIN 3.4*  AST 24  ALT 38  ALKPHOS 68  BILITOT 0.6   Lipids No results for input(s): "CHOL", "TRIG", "HDL", "LABVLDL", "LDLCALC", "CHOLHDL" in the last 168 hours.  Hematology Recent Labs  Lab 03/25/23 0907  WBC 9.9  RBC 4.63  HGB 13.8  HCT 45.7  MCV 98.7  MCH 29.8  MCHC 30.2  RDW 16.8*   PLT 262   Thyroid No results for input(s): "TSH", "FREET4" in the last 168 hours.  BNP Recent Labs  Lab 03/25/23 0907  BNP 73.0    DDimer No results for input(s): "DDIMER" in the last 168 hours.   Radiology/Studies:  DG Chest Portable 1 View  Result Date: 03/25/2023 CLINICAL DATA:  Shortness of breath EXAM: PORTABLE CHEST 1 VIEW COMPARISON:  03/04/2023 FINDINGS: Limited exam because of portable technique and body habitus. Heart is enlarged with central vascular congestion. Suspect mild bibasilar atelectasis. No large effusion or pneumothorax. Trachea midline. Monitor leads overlie the chest. Overall stable exam. IMPRESSION: Stable chest exam.  No interval change by plain radiography Electronically Signed   By: Judie Petit.  Shick M.D.   On: 03/25/2023 10:38     Assessment and Plan:   Atrial flutter with RVR  Patient admitted with symptoms of dizziness, rapid HR, exertional intolerance, dyspnea. Found to be in atrial flutter, initially 2:1 conduction with ventricular rate 139bpm. Patient started on Diltiazem with improved rates, now in 3:1 flutter.  Troponin of 26->38 consistent with demand ischemia in the setting of dyspnea/hypoxia and aflutter with RVR. ECG without acute  ischemic changes noted.  Patient with multiple risk factors for atrial flutter including obesity and OSA. Recommend continuing Diltiazem infusion this afternoon until stable rates achieved. Would transition to PO Diltiazem at that point vs beta blocker. Patient not a good candidate for TEE/DCCV due to body habitus.  Check TSH Plan to transition from heparin to DOAC with CHA2DS2-VASc Score = 2  Defer workup of possible infection to primary team.   Hypertension  Patient on Losartan 100mg  PTA. Hold with need for diltiazem for rate control.   Acute on chronic HFpEF  Echocardiogram from recent admission severely limited by body habitus. No clear view to assess LVEF. BNP today 73 but CXR with some vascular congestion. Suspect  hypervolemia secondary to RVR. IV lasix 40mg  ordered by ED provider, reasonable to administer once and assess response. Volume status/vascular congestion will hopefully improve with rate control.   Plan to resume home Torsemide 40mg  tomorrow. Consider MRA prior to discharge.  Given elevated BMI, would not pursue SGLT2 at this time.   OSA  Patient compliant nightly with CPAP but does not currently have ability for O2 bleed in. Will need assistance with access to alternative equipment.   Recurrent cellulitis  Per primary team.   Risk Assessment/Risk Scores:        New York Heart Association (NYHA) Functional Class NYHA Class III  CHA2DS2-VASc Score = 2   This indicates a 2.2% annual risk of stroke. The patient's score is based upon: CHF History: 1 HTN History: 1 Diabetes History: 0 Stroke History: 0 Vascular Disease History: 0 Age Score: 0 Gender Score: 0     For questions or updates, please contact [facility] Please consult [email or web address] for contact info under    Signed, Perlie Gold, PA-C  03/25/2023 1:59 PM  Patient seen and examined.  Agree with above documentation.  Mr. Papendick is a 42 year old male with a history of of OSA, diastolic heart failure who we are consulted for evaluation of atrial flutter at the request of Dr. Robb Matar.  He had recent admission with sepsis due to cellulitis, complicated by acute hypoxic respiratory failure and acute on chronic diastolic heart failure.  He was diuresed over 25 L during admission and discharged on torsemide 40 mg daily.  Subsequently presented to ED today with significant shortness of breath.  Initial vital signs notable for BP 139/95, SpO2 80% on room air, heart rate 140.  EKG shows atrial flutter with RVR, rate 139.  Labs notable for creatinine 0.94, lactate 1.7, hemoglobin 13.8, platelets 262.  Recent echocardiogram 02/2023 showed poor acoustic windows, unable to determine EF. On exam, patient is alert and oriented,  irregular rhythm,normal rate, no murmurs, diminished breath sounds, nonpitting LE edema.  For his atrial flutter with RVR, will transition from heparin drip to Eliquis.  Can continue diltiazem drip for now for rate control, will transition to p.o. metoprolol tomorrow.  Rates currently well controlled, in 90s. Given BMI 83, not a good candidate for TEE/DCCV.  Will plan for rate control and anticoagulation, and if does not convert can consider DCCV once completes 3 weeks of anticoagulation.  Little Ishikawa, MD

## 2023-03-26 ENCOUNTER — Observation Stay (HOSPITAL_COMMUNITY): Payer: Managed Care, Other (non HMO)

## 2023-03-26 DIAGNOSIS — Z7901 Long term (current) use of anticoagulants: Secondary | ICD-10-CM | POA: Diagnosis not present

## 2023-03-26 DIAGNOSIS — Z8489 Family history of other specified conditions: Secondary | ICD-10-CM | POA: Diagnosis not present

## 2023-03-26 DIAGNOSIS — L0390 Cellulitis, unspecified: Secondary | ICD-10-CM | POA: Diagnosis present

## 2023-03-26 DIAGNOSIS — G4733 Obstructive sleep apnea (adult) (pediatric): Secondary | ICD-10-CM | POA: Diagnosis present

## 2023-03-26 DIAGNOSIS — I890 Lymphedema, not elsewhere classified: Secondary | ICD-10-CM | POA: Diagnosis present

## 2023-03-26 DIAGNOSIS — R0902 Hypoxemia: Secondary | ICD-10-CM | POA: Diagnosis present

## 2023-03-26 DIAGNOSIS — Z882 Allergy status to sulfonamides status: Secondary | ICD-10-CM | POA: Diagnosis not present

## 2023-03-26 DIAGNOSIS — Z6841 Body Mass Index (BMI) 40.0 and over, adult: Secondary | ICD-10-CM | POA: Diagnosis not present

## 2023-03-26 DIAGNOSIS — I4892 Unspecified atrial flutter: Secondary | ICD-10-CM | POA: Diagnosis present

## 2023-03-26 DIAGNOSIS — R7303 Prediabetes: Secondary | ICD-10-CM | POA: Diagnosis present

## 2023-03-26 DIAGNOSIS — Z79899 Other long term (current) drug therapy: Secondary | ICD-10-CM | POA: Diagnosis not present

## 2023-03-26 DIAGNOSIS — Z87891 Personal history of nicotine dependence: Secondary | ICD-10-CM | POA: Diagnosis not present

## 2023-03-26 DIAGNOSIS — E66813 Obesity, class 3: Secondary | ICD-10-CM | POA: Diagnosis present

## 2023-03-26 DIAGNOSIS — I2489 Other forms of acute ischemic heart disease: Secondary | ICD-10-CM | POA: Diagnosis present

## 2023-03-26 DIAGNOSIS — I5033 Acute on chronic diastolic (congestive) heart failure: Secondary | ICD-10-CM | POA: Diagnosis present

## 2023-03-26 DIAGNOSIS — R0989 Other specified symptoms and signs involving the circulatory and respiratory systems: Secondary | ICD-10-CM | POA: Diagnosis present

## 2023-03-26 DIAGNOSIS — E781 Pure hyperglyceridemia: Secondary | ICD-10-CM | POA: Diagnosis present

## 2023-03-26 DIAGNOSIS — Z1152 Encounter for screening for COVID-19: Secondary | ICD-10-CM | POA: Diagnosis not present

## 2023-03-26 DIAGNOSIS — I110 Hypertensive heart disease with heart failure: Secondary | ICD-10-CM | POA: Diagnosis present

## 2023-03-26 DIAGNOSIS — I4891 Unspecified atrial fibrillation: Secondary | ICD-10-CM | POA: Diagnosis present

## 2023-03-26 LAB — CBC
HCT: 43.4 % (ref 39.0–52.0)
Hemoglobin: 13.4 g/dL (ref 13.0–17.0)
MCH: 31.2 pg (ref 26.0–34.0)
MCHC: 30.9 g/dL (ref 30.0–36.0)
MCV: 101.2 fL — ABNORMAL HIGH (ref 80.0–100.0)
Platelets: 224 10*3/uL (ref 150–400)
RBC: 4.29 MIL/uL (ref 4.22–5.81)
RDW: 16.8 % — ABNORMAL HIGH (ref 11.5–15.5)
WBC: 9.4 10*3/uL (ref 4.0–10.5)
nRBC: 0 % (ref 0.0–0.2)

## 2023-03-26 LAB — COMPREHENSIVE METABOLIC PANEL
ALT: 33 U/L (ref 0–44)
AST: 17 U/L (ref 15–41)
Albumin: 3.1 g/dL — ABNORMAL LOW (ref 3.5–5.0)
Alkaline Phosphatase: 63 U/L (ref 38–126)
Anion gap: 9 (ref 5–15)
BUN: 17 mg/dL (ref 6–20)
CO2: 24 mmol/L (ref 22–32)
Calcium: 8.7 mg/dL — ABNORMAL LOW (ref 8.9–10.3)
Chloride: 103 mmol/L (ref 98–111)
Creatinine, Ser: 0.78 mg/dL (ref 0.61–1.24)
GFR, Estimated: >60 mL/min (ref >60)
Glucose, Bld: 119 mg/dL — ABNORMAL HIGH (ref 70–99)
Potassium: 3.9 mmol/L (ref 3.5–5.1)
Sodium: 136 mmol/L (ref 135–145)
Total Bilirubin: 0.7 mg/dL (ref <1.2)
Total Protein: 7.4 g/dL (ref 6.5–8.1)

## 2023-03-26 LAB — ECHOCARDIOGRAM LIMITED
Height: 74 in
Weight: 10400 [oz_av]

## 2023-03-26 LAB — MAGNESIUM: Magnesium: 2.2 mg/dL (ref 1.7–2.4)

## 2023-03-26 MED ORDER — FUROSEMIDE 10 MG/ML IJ SOLN
40.0000 mg | Freq: Once | INTRAMUSCULAR | Status: AC
  Administered 2023-03-26: 40 mg via INTRAVENOUS
  Filled 2023-03-26: qty 4

## 2023-03-26 MED ORDER — TORSEMIDE 20 MG PO TABS
40.0000 mg | ORAL_TABLET | Freq: Every day | ORAL | Status: DC
  Administered 2023-03-27 – 2023-03-28 (×2): 40 mg via ORAL
  Filled 2023-03-26 (×2): qty 2

## 2023-03-26 MED ORDER — METOPROLOL TARTRATE 5 MG/5ML IV SOLN
2.5000 mg | Freq: Once | INTRAVENOUS | Status: AC
  Administered 2023-03-26: 2.5 mg via INTRAVENOUS
  Filled 2023-03-26: qty 5

## 2023-03-26 MED ORDER — PERFLUTREN LIPID MICROSPHERE
1.0000 mL | INTRAVENOUS | Status: AC | PRN
  Administered 2023-03-26: 5 mL via INTRAVENOUS

## 2023-03-26 MED ORDER — DILTIAZEM HCL 60 MG PO TABS
60.0000 mg | ORAL_TABLET | Freq: Four times a day (QID) | ORAL | Status: DC
  Administered 2023-03-26 – 2023-03-27 (×4): 60 mg via ORAL
  Filled 2023-03-26 (×4): qty 1

## 2023-03-26 NOTE — Progress Notes (Signed)
[facility] Progress Note Patient: Willie Steele ONG:[id number] DOB: 1980/07/17 DOA: 03/25/2023  DOS: the patient was seen and examined on 03/26/2023  Brief hospital course: Willie Steele is a 42 y.o. male with medical history significant of chronic diastolic heart failure, hypertension, prediabetes, hypertriglyceridemia, metabolic syndrome, vitamin D deficiency, OSA on CPAP, class III obesity with a current BMI of 83.46 kg/m who was recently admitted and discharged due to sepsis in the setting of cellulitis who is coming today to the emergency department due to progressively worse dyspnea worsened by exertion associated with dizziness.  Found to have a flutter with RVR. Cardiology was consulted. Currently on Cardizem.  Assessment and Plan: Atrial flutter with rapid ventricular response (HCC) Initially was on IV Cardizem drip as well as IV heparin drip. Currently on oral Cardizem. Also on oral Eliquis. Continue as needed Lopressor. Continue monitor on telemetry. Limited echocardiogram ordered.  Acute on chronic HFpEF. Cardiomegaly Is in the setting of water/diastolic heart failure. Continue treatment as above. Continue diuresis.  Limited echocardiogram ordered.   Morbid obesity (HCC) Current BMI 83.46 kg/m. Lifestyle modifications. Follow-up closely PCP and/or bariatric clinic.   OSA on CPAP Continue CPAP at bedtime and as needed. Sleep study has been not repeated in last 10 years.  At present appears that he might actually benefit from having BiPAP therapy at home.  Will be working to arrange NIV on discharge.   Prediabetes Hemoglobin A1c 6.0%.   Cellulitis Improved. Continue linezolid 600 mg p.o. twice daily.  Subjective: Breathing better.  No nausea no vomiting.  Overnight had significant trouble breathing.  No chest pain.  Physical Exam: General: in Mild distress, No Rash Cardiovascular: S1 and S2 Present, No Murmur Respiratory: Good respiratory effort,  Bilateral Air entry present. No Crackles, No wheezes Abdomen: Bowel Sound present, No tenderness Extremities: Bilateral edema Neuro: Alert and oriented x3, no new focal deficit  Data Reviewed: I have Reviewed nursing notes, Vitals, and Lab results. Since last encounter, pertinent lab results CBC and BMP   . I have ordered test including CBC and BMP  . I have discussed pt's care plan and test results with cardiology  .   Disposition: Status is: Inpatient Remains inpatient appropriate because: Continue diuresis and monitor rate control apixaban (ELIQUIS) tablet 5 mg   Family Communication: No one at bedside Level of care: Progressive   Vitals:   03/26/23 1600 03/26/23 1700 03/26/23 1800 03/26/23 1859  BP: (!) 120/106 (!) 147/85 (!) 153/101   Pulse: (!) 56  89 (!) 103  Resp: 19  (!) 22 (!) 29  Temp:      TempSrc:      SpO2: 97%  95% 93%  Weight:      Height:         Author: Lynden Oxford, MD 03/26/2023 7:11 PM  Please look on [email or web address] to find out who is on call.

## 2023-03-26 NOTE — Progress Notes (Addendum)
Patient Name: Willie Steele Date of Encounter: 03/26/2023 [facility] Cardiologist: None   Interval Summary  .    Patient reports feeling better today. Denies recurrent dizziness. He does admit to some overnight shortness of breath while only supported by [place]. Breathing improved significant with BiPAP placement. He has not moved much this morning so is not sure if exertional tolerance has recovered.   Vital Signs .    Vitals:   03/26/23 0845 03/26/23 0850 03/26/23 0855 03/26/23 0900  BP: (!) 182/104   (!) 157/100  Pulse: 88 90 89 89  Resp: 20 (!) 30 17 (!) 36  Temp:      TempSrc:      SpO2: 94% 92% 93% 94%  Weight:      Height:        Intake/Output Summary (Last 24 hours) at 03/26/2023 0955 Last data filed at 03/26/2023 0900 Gross per 24 hour  Intake 1162.42 ml  Output 1615 ml  Net -452.58 ml      03/25/2023    8:34 AM 03/09/2023    9:56 AM 03/08/2023    5:00 AM  Last 3 Weights  Weight (lbs) 650 lb 636 lb 0.4 oz --  Weight (kg) 294.838 kg 288.5 kg --      Telemetry/ECG    Persistent atrial flutter. Variable conduction but largely controlled ventricular rates with HR ~90-100bpm.  - Personally Reviewed  Physical Exam .   GEN: No acute distress.   Neck: unable to assess JVP Cardiac: rapid but regular rhythm, no murmurs, rubs, or gallops.  Respiratory: Clear to auscultation bilaterally. GI: Soft, nontender, non-distended  MS: bilateral LE edema with chronic skin changes   Assessment & Plan .     Atrial flutter with RVR   Patient admitted with symptoms of dizziness, rapid HR, exertional intolerance, dyspnea. Found to be in atrial flutter, initially 2:1 conduction with ventricular rate 139bpm. Patient started on Diltiazem with improved rates, in 3:1 flutter.   Troponin of 26->38 consistent with demand ischemia in the setting of dyspnea/hypoxia and aflutter with RVR. ECG without acute ischemic changes noted.  Patient with multiple risk factors for  atrial flutter including obesity and OSA. He remains on IV diltiazem today with generally stable rates in the 90s-low 100s. Will transition to PO Diltiazem and titrate off IV diltiazem. Patient not a good candidate for TEE/DCCV due to body habitus. Would ideally plan for DCCV after >3 weeks of uninterrupted OAC if he remains in afib. Anticipate he will not be ready for d/c until tomorrow.  PO Dilitazem 60mg  Q6hr. Convert to long acting once appropriate dose identified. Continue Eliquis with CHA2DS2-VASc Score = 2  Defer workup of possible infection to primary team.    Hypertension   Patient on Losartan 100mg  PTA. Hold with need for diltiazem for rate control.    Acute on chronic HFpEF   Echocardiogram from recent admission severely limited by body habitus. No clear view to assess LVEF. BNP only 73 but CXR with some vascular congestion. Suspect hypervolemia secondary to RVR. IV lasix 40mg  ordered by ED provider.   Plan to resume home Torsemide 40mg  tomorrow. Consider MRA prior to discharge.  Given elevated BMI, would not pursue SGLT2 at this time.  Repeat limited echo with contrast to better assess LV function.   OSA   Patient compliant nightly with CPAP but does not currently have ability for O2 bleed in. Will need assistance with access to alternative equipment.    Recurrent cellulitis  Per primary team.  For questions or updates, please contact [facility] Please consult [email or web address] for contact info under       Signed, Perlie Gold, PA-C    Patient seen and examined.  Agree with above documentation.  On exam, patient is alert and oriented, irregular, normal rate, distant heart sounds, diminished breath sounds, nonpitting lower extremity edema.  Rates well-controlled on diltiazem drip.  Will convert to p.o. diltiazem.  Continue Eliquis.  Previous echo last month showed poor windows, unable to determine LV systolic function, would recommend repeat echo with Definity for  better evaluation.  Little Ishikawa, MD

## 2023-03-26 NOTE — Progress Notes (Addendum)
   03/26/23 2022  BiPAP/CPAP/SIPAP  BiPAP/CPAP/SIPAP Pt Type Adult  BiPAP/CPAP/SIPAP V60  Mask Type Full face mask  Mask Size Large  Set Rate 8 breaths/min  Respiratory Rate 28 breaths/min  IPAP 18 cmH20  EPAP 12 cmH2O  FiO2 (%) (S)  35 % (SPO2 100% on 40% fio2, weaned down to 35%, rn aware)  Minute Ventilation 19.3  Leak 42  Peak Inspiratory Pressure (PIP) 19  Tidal Volume (Vt) 655  Patient Home Equipment No  Auto Titrate No  Press High Alarm 30 cmH2O  Press Low Alarm 5 cmH2O  CPAP/SIPAP surface wiped down Yes  BiPAP/CPAP /SiPAP Vitals  Pulse Rate 96  Resp (!) 28  BP (!) 141/75  SpO2 100 %  Bilateral Breath Sounds Diminished  MEWS Score/Color  MEWS Score 2  MEWS Score Color Yellow   Upon arrival to patient's room, pt found already wearing bipap.  Pt prefers to wear the bipap vs cpap tonight.  RN aware.

## 2023-03-26 NOTE — Discharge Instructions (Signed)

## 2023-03-26 NOTE — Plan of Care (Signed)
  Problem: Health Behavior/Discharge Planning: Goal: Ability to manage health-related needs will improve Outcome: Progressing   Problem: Clinical Measurements: Goal: Ability to maintain clinical measurements within normal limits will improve Outcome: Progressing Goal: Will remain free from infection Outcome: Progressing Goal: Diagnostic test results will improve Outcome: Progressing Goal: Respiratory complications will improve Outcome: Progressing Goal: Cardiovascular complication will be avoided Outcome: Progressing   Problem: Activity: Goal: Risk for activity intolerance will decrease Outcome: Progressing   Problem: Nutrition: Goal: Adequate nutrition will be maintained Outcome: Progressing   Problem: Elimination: Goal: Will not experience complications related to bowel motility Outcome: Progressing   Problem: Pain Management: Goal: General experience of comfort will improve Outcome: Progressing   Problem: Safety: Goal: Ability to remain free from injury will improve Outcome: Progressing   Problem: Skin Integrity: Goal: Risk for impaired skin integrity will decrease Outcome: Progressing   Problem: Activity: Goal: Ability to tolerate increased activity will improve Outcome: Progressing   Problem: Cardiac: Goal: Ability to achieve and maintain adequate cardiopulmonary perfusion will improve Outcome: Progressing   Problem: Health Behavior/Discharge Planning: Goal: Ability to safely manage health-related needs after discharge will improve Outcome: Progressing   Problem: Education: Goal: Knowledge of disease or condition will improve Outcome: Progressing Goal: Understanding of medication regimen will improve Outcome: Progressing Goal: Individualized Educational Video(s) Outcome: Progressing

## 2023-03-26 NOTE — Progress Notes (Signed)
Narrative  Due to [facility] Acute on Chronic respiratory failure secondary to Obesity Hyperventilation Syndrome , non-invasive ventilation is needed to assist with normalizing carbon dioxide & oxygen levels and to reduce the risk of repeat, acute episodes of respiratory failure resulting in longer inpatient hospital stays with more acute levels of care, such as ICU and mechanical ventilation. This patient would also benefit from mouthpiece ventilation for prn daytime use. He has been hospitalized for Chronic hypercarbia respiratory failure and Obesity Hyperventilation Syndrome. BIPAP,BIPAP ST, AVAPS has been considered but has been ruled-out and insufficient. NIV therapy is needed Pt's PC02 was >52 during this hospital stay on 03/04/2023. Interruption or failure to provide NIV would quickly lead to exacerbation of the patient's condition, lead to more hospitalizations and likely harm the patient or possibly death. Continued use of the NIV is preferred. Patient is able to maintain airway and clear secretions.

## 2023-03-26 NOTE — TOC Initial Note (Signed)
Transition of Care [facility]) - Initial/Assessment Note    Patient Details  Name: Willie Steele MRN: [id number] Date of Birth: 1981-02-21  Transition of Care [facility]) CM/SW Contact:    Adrian Prows, RN Phone Number: 03/26/2023, 10:30 AM  Clinical Narrative:                 TOC for d/c planning; spoke w/ pt in room; pt says he lives at home; he plans to return at d/c; pt confirms he has insurance/PCP; pt says his transportation is TBD; he denies SDOH risks; pt says he has a cane, and walker; pt says he does not have HH services; his home oxygen is from [place]; pt says he only uses oxygen at home, and he does not need a travel tank; TOC will follow.  Expected Discharge Plan: Home/Self Care Barriers to Discharge: Continued Medical Work up   Patient Goals and CMS Choice Patient states their goals for this hospitalization and ongoing recovery are:: home CMS Medicare.gov Compare Post Acute Care list provided to:: Patient   [place] ownership interest in [facility]provided to:: Patient    Expected Discharge Plan and Services   Discharge Planning Services: CM Consult Post Acute Care Choice: Resumption of Svcs/PTA Provider (home oxygen w/ [employer]) Living arrangements for the past 2 months: Single Family Home                                      Prior Living Arrangements/Services Living arrangements for the past 2 months: Single Family Home Lives with:: Self Patient language and need for interpreter reviewed:: Yes Do you feel safe going back to the place where you live?: Yes      Need for Family Participation in Patient Care: Yes (Comment) Care giver support system in place?: Yes (comment) Current home services: DME (cane, walker, home oxygen w/ [employer]) Criminal Activity/Legal Involvement Pertinent to Current Situation/Hospitalization: No - Comment as needed  Activities of Daily Living      Permission Sought/Granted Permission sought to share information  with : Case Manager Permission granted to share information with : Yes, Verbal Permission Granted  Share Information with NAME: CaseManger     Permission granted to share info w Relationship: Rowan Bouwkamp (mother) [phone]     Emotional Assessment Appearance:: Appears stated age Attitude/Demeanor/Rapport: Gracious Affect (typically observed): Accepting Orientation: : Oriented to Self, Oriented to Place, Oriented to  Time, Oriented to Situation Alcohol / Substance Use: Not Applicable Psych Involvement: No (comment)  Admission diagnosis:  Pulmonary congestion [R09.89] Atrial flutter with rapid ventricular response (HCC) [I48.92] Atrial flutter, unspecified type (HCC) [I48.92] Dyspnea, unspecified type [R06.00] Patient Active Problem List   Diagnosis Date Noted   Atrial flutter with rapid ventricular response (HCC) 03/25/2023   Hypokalemia 03/05/2023   Sepsis due to cellulitis (HCC) 03/04/2023   OSA on CPAP 06/02/2021   Prediabetes 06/02/2021   Acute diastolic congestive heart failure (HCC) 06/02/2021   Acute respiratory failure with hypoxia (HCC) 05/29/2021   Cardiomegaly 05/29/2021   Hypertriglyceridemia 08/30/2020   Metabolic syndrome 08/30/2020   Cellulitis 08/06/2020   Exudative pharyngitis 05/12/2016   Hypertension, benign 08/03/2012   Morbid obesity (HCC) 08/03/2012   Vitamin D deficiency 04/19/2012   PCP:  Tracey Harries, MD Pharmacy:   [phone] [place], [place] - [address] AT [facility] [address] [place] [address] Phone: [phone] Fax:  [phone]  [id number] [place], [place] - [address] AT ACROSS FROM [address] [place] [address] Phone: [phone] Fax: [phone]  [place] - [facility] [address] [place] [address] Phone: [phone] Fax: [phone]     Social Drivers of Health (SDOH) Social  History: SDOH Screenings   Food Insecurity: No Food Insecurity (03/26/2023)  Housing: Unknown (03/26/2023)  Transportation Needs: No Transportation Needs (03/26/2023)  Utilities: Not At Risk (03/26/2023)  Financial Resource Strain: Low Risk  (05/08/2022)   Received from [facility], [facility]  Physical Activity: Inactive (11/12/2021)   Received from [facility], [facility], [facility]  Social Connections: Unknown (11/14/2022)   Received from [facility]  Stress: Stress Concern Present (11/12/2021)   [facility] from [facility], [facility], [facility]  Tobacco Use: Medium Risk (03/25/2023)   SDOH Interventions: Food Insecurity Interventions: Intervention Not Indicated, Inpatient TOC Housing Interventions: Intervention Not Indicated, Inpatient TOC Transportation Interventions: Intervention Not Indicated, Inpatient TOC Utilities Interventions: Intervention Not Indicated, Inpatient TOC   Readmission Risk Interventions     No data to display

## 2023-03-27 DIAGNOSIS — I4892 Unspecified atrial flutter: Secondary | ICD-10-CM | POA: Diagnosis not present

## 2023-03-27 LAB — BASIC METABOLIC PANEL
Anion gap: 7 (ref 5–15)
BUN: 15 mg/dL (ref 6–20)
CO2: 28 mmol/L (ref 22–32)
Calcium: 8.5 mg/dL — ABNORMAL LOW (ref 8.9–10.3)
Chloride: 99 mmol/L (ref 98–111)
Creatinine, Ser: 0.66 mg/dL (ref 0.61–1.24)
GFR, Estimated: >60 mL/min (ref >60)
Glucose, Bld: 130 mg/dL — ABNORMAL HIGH (ref 70–99)
Potassium: 3.8 mmol/L (ref 3.5–5.1)
Sodium: 134 mmol/L — ABNORMAL LOW (ref 135–145)

## 2023-03-27 LAB — MAGNESIUM: Magnesium: 2.1 mg/dL (ref 1.7–2.4)

## 2023-03-27 MED ORDER — SODIUM CHLORIDE 0.9% FLUSH
3.0000 mL | Freq: Two times a day (BID) | INTRAVENOUS | Status: DC
  Administered 2023-03-27 – 2023-03-28 (×2): 3 mL via INTRAVENOUS

## 2023-03-27 MED ORDER — DILTIAZEM HCL ER COATED BEADS 120 MG PO CP24
240.0000 mg | ORAL_CAPSULE | Freq: Every day | ORAL | Status: DC
  Administered 2023-03-27 – 2023-03-28 (×2): 240 mg via ORAL
  Filled 2023-03-27 (×2): qty 2

## 2023-03-27 NOTE — Progress Notes (Signed)
   03/27/23 2332  BiPAP/CPAP/SIPAP  BiPAP/CPAP/SIPAP Pt Type Adult  BiPAP/CPAP/SIPAP V60  Mask Type Full face mask  Mask Size Large  Set Rate 8 breaths/min  Respiratory Rate 18 breaths/min  IPAP 18 cmH20  EPAP 12 cmH2O  FiO2 (%) 35 %  Minute Ventilation 16  Leak 19  Peak Inspiratory Pressure (PIP) 19  Tidal Volume (Vt) 719  Patient Home Equipment No  Auto Titrate No  Press High Alarm 35 cmH2O  Press Low Alarm 5 cmH2O  CPAP/SIPAP surface wiped down Yes  BiPAP/CPAP /SiPAP Vitals  Pulse Rate 85  Resp (!) 25  BP 134/62  SpO2 97 %  Bilateral Breath Sounds Clear;Diminished  MEWS Score/Color  MEWS Score 1  MEWS Score Color Green

## 2023-03-27 NOTE — Plan of Care (Signed)
  Problem: Health Behavior/Discharge Planning: Goal: Ability to manage health-related needs will improve Outcome: Progressing   Problem: Clinical Measurements: Goal: Ability to maintain clinical measurements within normal limits will improve Outcome: Progressing Goal: Diagnostic test results will improve Outcome: Progressing Goal: Respiratory complications will improve Outcome: Progressing   Problem: Activity: Goal: Risk for activity intolerance will decrease Outcome: Progressing   Problem: Pain Management: Goal: General experience of comfort will improve Outcome: Progressing   Problem: Safety: Goal: Ability to remain free from injury will improve Outcome: Not Progressing

## 2023-03-27 NOTE — TOC Progression Note (Signed)
Transition of Care [facility]) - Progression Note    Patient Details  Name: Willie Steele MRN: [id number] Date of Birth: 1980-12-08  Transition of Care [facility]) CM/SW Contact  Adrian Prows, RN Phone Number: 03/27/2023, 2:55 PM  Clinical Narrative:    Signed orders for NIV and narrative securely emailed to [email or web address].   Expected Discharge Plan: Home/Self Care Barriers to Discharge: Continued Medical Work up  Expected Discharge Plan and Services   Discharge Planning Services: CM Consult Post Acute Care Choice: Resumption of Svcs/PTA Provider (home oxygen w/ [employer]) Living arrangements for the past 2 months: Single Family Home                                       Social Determinants of Health (SDOH) Interventions SDOH Screenings   Food Insecurity: No Food Insecurity (03/26/2023)  Housing: Unknown (03/26/2023)  Transportation Needs: No Transportation Needs (03/26/2023)  Utilities: Not At Risk (03/26/2023)  Financial Resource Strain: Low Risk  (05/08/2022)   Received from [facility], [facility]  Physical Activity: Inactive (11/12/2021)   Received from [facility], [facility], [facility]  Social Connections: Unknown (11/14/2022)   Received from [facility]  Stress: Stress Concern Present (11/12/2021)   Received from [facility], [facility], [facility]  Tobacco Use: Medium Risk (03/25/2023)    Readmission Risk Interventions     No data to display

## 2023-03-27 NOTE — Hospital Course (Signed)
Brief hospital course: Willie Steele is a 42 y.o. male with medical history significant of chronic diastolic heart failure, hypertension, prediabetes, hypertriglyceridemia, metabolic syndrome, vitamin D deficiency, OSA on CPAP, class III obesity with a current BMI of 83.46 kg/m who was recently admitted and discharged due to sepsis in the setting of cellulitis who is coming today to the emergency department due to progressively worse dyspnea worsened by exertion associated with dizziness.  Found to have a flutter with RVR. Cardiology was consulted. Currently on Cardizem.   Assessment and Plan: Atrial flutter with rapid ventricular response (HCC) Initially was on IV Cardizem drip as well as IV heparin drip. Currently on oral Cardizem.  Switched from short acting to long-acting version. Also on oral Eliquis. Continue as needed Lopressor. Continue monitor on telemetry. Most likely will require outpatient cardioversion/ablation.   Acute on chronic HFpEF. Cardiomegaly Is in the setting of water/diastolic heart failure. Continue treatment as above. Continue diuresis.  Limited echocardiogram shows preserved EF..   Morbid obesity (HCC) Body mass index is 83.46 kg/m.  Lifestyle modifications. Follow-up closely PCP and/or bariatric clinic.   OSA on CPAP Continue CPAP at bedtime and as needed. Sleep study has been not repeated in last 10 years.   Suspect CPAP therapy is not adequate given his body habitus. Continuing BiPAP/NIV in the hospital. Would recommend NIV outside of the hospital as well.   Prediabetes Hemoglobin A1c 6.0%.   Cellulitis Improved. Continue linezolid 600 mg p.o. twice daily. Technically patient was started on Zyvox as vancomycin was not an option given his weight for weight bolus protocol.  But this was only initiated as empirical treatment.  Recommend patient to use narrow spectrum antibiotics going forward. Patient has been on doxycycline intermittently.

## 2023-03-27 NOTE — Progress Notes (Addendum)
Patient Name: Willie Steele Date of Encounter: 03/27/2023 [facility] Cardiologist: None   Interval Summary  .    Patient continues to feel much better. He is anxious about discharge, worries about having recurrent RVR. Denies focal symptoms this morning. Continues breathing/sleeping better at night with BiPAP in place.   Vital Signs .    Vitals:   03/27/23 0635 03/27/23 0645 03/27/23 0700 03/27/23 0800  BP: (!) 120/105  129/76 129/65  Pulse:  72 86 65  Resp:  (!) 26 (!) 22 (!) 24  Temp:      TempSrc:      SpO2:  99% 100% 96%  Weight:      Height:        Intake/Output Summary (Last 24 hours) at 03/27/2023 0831 Last data filed at 03/27/2023 4696 Gross per 24 hour  Intake 248.46 ml  Output 1900 ml  Net -1651.54 ml      03/25/2023    8:34 AM 03/09/2023    9:56 AM 03/08/2023    5:00 AM  Last 3 Weights  Weight (lbs) 650 lb 636 lb 0.4 oz --  Weight (kg) 294.838 kg 288.5 kg --      Telemetry/ECG    Persistent atrial flutter with variable conduction. Rates this morning are 70-90. Telemetry is misinterpreting flutter waves as QRS complex and falsely elevating HR - Personally Reviewed  Physical Exam .   GEN: No acute distress.   Neck: limited assessment due to body habitus Cardiac: irregular rhythm, no murmurs, rubs, or gallops (heart sounds faint)  Respiratory: Clear to auscultation bilaterally. GI: Soft, nontender, non-distended  MS: Non pitting edema with chronic changes of skin  Assessment & Plan .     Atrial flutter with RVR   Patient admitted with symptoms of dizziness, rapid HR, exertional intolerance, dyspnea. Found to be in atrial flutter, initially 2:1 conduction with ventricular rate 139bpm. Patient started on Diltiazem with improved rates, primarily in 3:1 flutter.   Troponin of 26->38 consistent with demand ischemia in the setting of dyspnea/hypoxia and aflutter with RVR. ECG without acute ischemic changes noted.  Patient with multiple risk  factors for atrial flutter including obesity and OSA. He remains on small amount of IV diltiazem today with generally stable rates in the 70s-90s. As noted above, telemetry is misinterpreting flutter waves as beats and falsely elevating HR. Transition to cardizem CD today at noon. Patient not a good candidate for TEE/DCCV due to body habitus.  DCCV would be an option after >3 weeks of uninterrupted OAC if he remains in afib. Anticipate discharge tomorrow , will schedule close f/u in afib clinic Continue Eliquis with CHA2DS2-VASc Score = 2  Defer workup of possible infection to primary team.    Hypertension   Patient on Losartan 100mg  PTA. Hold with need for diltiazem for rate control.    Acute on chronic HFpEF   Echocardiogram from recent admission severely limited by body habitus. No clear view to assess LVEF. BNP only 73 but CXR with some vascular congestion. Suspect hypervolemia secondary to RVR. IV lasix 40mg  ordered by ED provider.   Plan to resume home Torsemide 40mg  today. Consider MRA prior to discharge or in outpatient follow up.  Given elevated BMI, would not pursue SGLT2 at this time.  Repeat limited echo with contrast shows LVEF 60-65%   OSA   Patient compliant nightly with CPAP but does not currently have ability for O2 bleed in. Will need assistance with access to alternative equipment.  Recurrent cellulitis   Per primary team.   For questions or updates, please contact [facility] Please consult [email or web address] for contact info under        Signed, Perlie Gold, PA-C    Patient seen and examined.  Agree with above documentation.  On exam, patient is alert and oriented, regular rate and rhythm, distant heart sounds, diminished breath sounds, nonpitting edema.  Echo shows EF 60 to 65%.  Still on low dose dilt gtt this morning, will transition to p.o. diltiazem 240 mg today.  If rates controlled, can likely plan discharge tomorrow.  Convert to p.o. torsemide  for diuresis.  Would plan follow-up in A-fib clinic in 2 weeks.  Little Ishikawa, MD

## 2023-03-27 NOTE — Progress Notes (Signed)
[facility] Progress Note Patient: Willie Steele XBM:[id number] DOB: 1980/05/21 DOA: 03/25/2023  DOS: the patient was seen and examined on 03/27/2023  Brief hospital course: Willie Steele is a 42 y.o. male with medical history significant of chronic diastolic heart failure, hypertension, prediabetes, hypertriglyceridemia, metabolic syndrome, vitamin D deficiency, OSA on CPAP, class III obesity with a current BMI of 83.46 kg/m who was recently admitted and discharged due to sepsis in the setting of cellulitis who is coming today to the emergency department due to progressively worse dyspnea worsened by exertion associated with dizziness.  Found to have a flutter with RVR. Cardiology was consulted. Currently on Cardizem.   Assessment and Plan: Atrial flutter with rapid ventricular response (HCC) Initially was on IV Cardizem drip as well as IV heparin drip. Currently on oral Cardizem.  Switched from short acting to long-acting version. Also on oral Eliquis. Continue as needed Lopressor. Continue monitor on telemetry. Most likely will require outpatient cardioversion/ablation.   Acute on chronic HFpEF. Cardiomegaly Is in the setting of water/diastolic heart failure. Continue treatment as above. Continue diuresis.  Limited echocardiogram shows preserved EF..   Morbid obesity (HCC) Body mass index is 83.46 kg/m.  Lifestyle modifications. Follow-up closely PCP and/or bariatric clinic.   OSA on CPAP Continue CPAP at bedtime and as needed. Sleep study has been not repeated in last 10 years.   Suspect CPAP therapy is not adequate given his body habitus. Continuing BiPAP/NIV in the hospital. Would recommend NIV outside of the hospital as well.   Prediabetes Hemoglobin A1c 6.0%.   Cellulitis Improved. Continue linezolid 600 mg p.o. twice daily. Technically patient was started on Zyvox as vancomycin was not an option given his weight for weight bolus protocol.  But this was only  initiated as empirical treatment.  Recommend patient to use narrow spectrum antibiotics going forward. Patient has been on doxycycline intermittently.   Subjective: No acute complaint no nausea no vomiting.  No overnight shortness of breath still reported.  Physical Exam: General: in Mild distress, No Rash Cardiovascular: S1 and S2 Present, No Murmur Respiratory: Good respiratory effort, Bilateral Air entry present. No Crackles, No wheezes Abdomen: Bowel Sound present, No tenderness Extremities: Chronic lymphedema, no acute edema Neuro: Alert and oriented x3, no new focal deficit  Data Reviewed: I have Reviewed nursing notes, Vitals, and Lab results. Since last encounter, pertinent lab results CBC and BMP   . I have ordered test including Mg and BMP  . I have discussed pt's care plan and test results with cardiology  .   Disposition: Status is: Inpatient Remains inpatient appropriate because: Monitor for volume status improvement  apixaban (ELIQUIS) tablet 5 mg   Family Communication: No one at bedside Level of care: Stepdown   Vitals:   03/27/23 1400 03/27/23 1500 03/27/23 1527 03/27/23 1600  BP: (!) 147/66 (!) 162/85  (!) 124/91  Pulse: 88 87  87  Resp: (!) 26 19  (!) 25  Temp:   98.5 F (36.9 C)   TempSrc:   Oral   SpO2: 92% 95%  99%  Weight:      Height:         Author: Lynden Oxford, MD 03/27/2023 6:27 PM  Please look on [email or web address] to find out who is on call.

## 2023-03-27 NOTE — Progress Notes (Signed)
   03/27/23 2011  BiPAP/CPAP/SIPAP  BiPAP/CPAP/SIPAP Pt Type Adult  BiPAP/CPAP/SIPAP V60  Reason BIPAP/CPAP not in use Other(comment) (Pt not ready to wear his bipap yet)  BiPAP/CPAP /SiPAP Vitals  Pulse Rate 90  Resp (!) 29  SpO2 93 %  MEWS Score/Color  MEWS Score 2  MEWS Score Color Yellow

## 2023-03-28 ENCOUNTER — Other Ambulatory Visit (HOSPITAL_COMMUNITY): Payer: Self-pay

## 2023-03-28 DIAGNOSIS — I4892 Unspecified atrial flutter: Secondary | ICD-10-CM | POA: Diagnosis not present

## 2023-03-28 LAB — BASIC METABOLIC PANEL
Anion gap: 6 (ref 5–15)
BUN: 16 mg/dL (ref 6–20)
CO2: 29 mmol/L (ref 22–32)
Calcium: 8.5 mg/dL — ABNORMAL LOW (ref 8.9–10.3)
Chloride: 99 mmol/L (ref 98–111)
Creatinine, Ser: 0.69 mg/dL (ref 0.61–1.24)
GFR, Estimated: >60 mL/min (ref >60)
Glucose, Bld: 136 mg/dL — ABNORMAL HIGH (ref 70–99)
Potassium: 3.4 mmol/L — ABNORMAL LOW (ref 3.5–5.1)
Sodium: 134 mmol/L — ABNORMAL LOW (ref 135–145)

## 2023-03-28 LAB — MAGNESIUM: Magnesium: 2.1 mg/dL (ref 1.7–2.4)

## 2023-03-28 MED ORDER — LOSARTAN POTASSIUM 50 MG PO TABS
100.0000 mg | ORAL_TABLET | Freq: Every day | ORAL | Status: DC
  Administered 2023-03-28: 100 mg via ORAL
  Filled 2023-03-28: qty 2

## 2023-03-28 MED ORDER — POTASSIUM CHLORIDE CRYS ER 20 MEQ PO TBCR: 20.0000 meq | EXTENDED_RELEASE_TABLET | Freq: Two times a day (BID) | ORAL | 0 refills | Status: AC

## 2023-03-28 MED ORDER — METOPROLOL TARTRATE 5 MG/5ML IV SOLN: 2.5000 mg | Freq: Four times a day (QID) | INTRAVENOUS | Status: DC | PRN

## 2023-03-28 MED ORDER — TORSEMIDE 20 MG PO TABS: 40.0000 mg | ORAL_TABLET | Freq: Every day | ORAL | 0 refills | Status: AC

## 2023-03-28 MED ORDER — APIXABAN 5 MG PO TABS
5.0000 mg | ORAL_TABLET | Freq: Two times a day (BID) | ORAL | 0 refills | Status: DC
  Filled 2023-03-28: qty 60, 30d supply, fill #0

## 2023-03-28 MED ORDER — APIXABAN 5 MG PO TABS: 5.0000 mg | ORAL_TABLET | Freq: Two times a day (BID) | ORAL | 0 refills | Status: DC

## 2023-03-28 MED ORDER — DILTIAZEM HCL ER COATED BEADS 240 MG PO CP24: 240.0000 mg | ORAL_CAPSULE | Freq: Every day | ORAL | 0 refills | Status: DC

## 2023-03-28 NOTE — Plan of Care (Signed)
  Problem: Health Behavior/Discharge Planning: Goal: Ability to manage health-related needs will improve Outcome: Progressing   Problem: Clinical Measurements: Goal: Ability to maintain clinical measurements within normal limits will improve Outcome: Progressing Goal: Will remain free from infection Outcome: Progressing Goal: Diagnostic test results will improve Outcome: Progressing Goal: Respiratory complications will improve Outcome: Progressing Goal: Cardiovascular complication will be avoided Outcome: Progressing   Problem: Activity: Goal: Risk for activity intolerance will decrease Outcome: Progressing   Problem: Nutrition: Goal: Adequate nutrition will be maintained Outcome: Progressing   Problem: Elimination: Goal: Will not experience complications related to bowel motility Outcome: Progressing   Problem: Pain Management: Goal: General experience of comfort will improve Outcome: Progressing   Problem: Safety: Goal: Ability to remain free from injury will improve Outcome: Progressing

## 2023-03-28 NOTE — Progress Notes (Signed)
A-Flutter changed 3:1 to 5:1~4:1,notified Abby C ,NP, New order received.

## 2023-03-28 NOTE — TOC Transition Note (Addendum)
Transition of Care [facility]) - Discharge Note   Patient Details  Name: Willie Steele MRN: [id number] Date of Birth: 09/19/1980  Transition [facility]) CM/SW Contact:  Adrian Prows, RN Phone Number: 03/28/2023, 12:43 PM   Clinical Narrative:    D/C orders received; spoke w/ Vaughan Basta at [facility]; he says auth for NIV has been received; he also says NIV will be delivered to pt's home, and RT will provide education at that time; travel oxygen tank will be delivered to pt's room; spoke w/ pt in room; he verified d/c address: [address] [place] [address]; pt says his friends will transport him home; he should arrive 3:30 PM - 4:00 PM; pt can be contacted at [phone]; his mother Elder Cyphers can be contacted at [phone]; Vaughan Basta was given pt contact info; agency contact info placed in follow up provider section of d/c instructions; no TOC needs.   Final next level of care: Home/Self Care Barriers to Discharge: No Barriers Identified   Patient Goals and CMS Choice Patient states their goals for this hospitalization and ongoing recovery are:: home CMS Medicare.gov Compare Post Acute Care list provided to:: Patient   [place] ownership interest in [facility].provided to:: Patient    Discharge Placement                       Discharge Plan and Services Additional resources added to the After Visit Summary for     Discharge Planning Services: CM Consult Post Acute Care Choice: Resumption of Svcs/PTA Provider (home oxygen w/ [employer])          DME Arranged: NIV, Oxygen DME Agency: [employer] Date DME Agency Contacted: 03/28/23 Time DME Agency Contacted: [id number] Representative spoke with at DME Agency: Vaughan Basta            Social Drivers of Health (SDOH) Interventions SDOH Screenings   Food Insecurity: No Food Insecurity (03/26/2023)  Housing: Unknown (03/26/2023)  Transportation Needs: No Transportation Needs (03/26/2023)  Utilities: Not  At Risk (03/26/2023)  Financial Resource Strain: Low Risk  (05/08/2022)   Received from [facility], [facility]  Physical Activity: Inactive (11/12/2021)   Received [facility], [facility]  Social Connections: Unknown (11/14/2022)   Received from [facility]  Stress: Stress Concern Present (11/12/2021)   Received from [facility], [facility], [facility]  Tobacco Use: Medium Risk (03/25/2023)     Readmission Risk Interventions     No data to display

## 2023-03-28 NOTE — Progress Notes (Signed)
   03/28/23 0348  BiPAP/CPAP/SIPAP  BiPAP/CPAP/SIPAP Pt Type Adult  BiPAP/CPAP/SIPAP V60  Mask Type Full face mask  Mask Size Large  Set Rate 8 breaths/min  Respiratory Rate 23 breaths/min  IPAP 18 cmH20  EPAP 12 cmH2O  FiO2 (%) 35 %  Minute Ventilation 13  Leak 17  Peak Inspiratory Pressure (PIP) 19  Tidal Volume (Vt) 550  Patient Home Equipment No  Auto Titrate No  Press High Alarm 35 cmH2O  Press Low Alarm 5 cmH2O  BiPAP/CPAP /SiPAP Vitals  Pulse Rate 85  Resp (!) 23  BP (!) 166/66  SpO2 96 %  Bilateral Breath Sounds Clear;Diminished  MEWS Score/Color  MEWS Score 1  MEWS Score Color Green

## 2023-03-30 LAB — CULTURE, BLOOD (ROUTINE X 2)
Culture: NO GROWTH
Culture: NO GROWTH
Special Requests: ADEQUATE
Special Requests: ADEQUATE

## 2023-03-30 NOTE — Discharge Summary (Signed)
Physician Discharge Summary   Patient: Willie Steele MRN: [id number] DOB: 1980-10-15  Admit date:     03/25/2023  Discharge date: 03/28/2023  Discharge Physician: Lynden Oxford  PCP: Tracey Harries, MD  Recommendations at discharge: Follow-up with PCP in 1 week. Check BMP. Continue home oxygen on NIV.   Follow-up Information     Tracey Harries, MD. Schedule an appointment as soon as possible for a visit in 1 week(s).   Specialty: Family Medicine Why: with BMP lab to look at kidney and electrolytes Contact information: [address] [facility] [address] [phone]         [facility] Follow up.   Why: Home Oxygen and NIV Contact information: [address], [address] [place], [place] [address] [phone]               Discharge Diagnoses: Principal Problem:   Atrial flutter with rapid ventricular response (HCC) Active Problems:   Morbid obesity (HCC)   Cardiomegaly   OSA on CPAP   Prediabetes   Cellulitis  Brief hospital course: Willie Steele is a 42 y.o. male with medical history significant of chronic diastolic heart failure, hypertension, prediabetes, hypertriglyceridemia, metabolic syndrome, vitamin D deficiency, OSA on CPAP, class III obesity with a current BMI of 83.46 kg/m who was recently admitted and discharged due to sepsis in the setting of cellulitis who is coming today to the emergency department due to progressively worse dyspnea worsened by exertion associated with dizziness.  Found to have a flutter with RVR. Cardiology was consulted. Currently on Cardizem.   Assessment and Plan: Atrial flutter with rapid ventricular response (HCC) Initially was on IV Cardizem drip as well as IV heparin drip. Currently on oral Cardizem.  Switched from short acting to long-acting version. Also on oral Eliquis. Most likely will require outpatient cardioversion/ablation.   Acute on chronic HFpEF. Cardiomegaly Is in the  setting of water/diastolic heart failure. Continue treatment as above. Continue diuresis.  Limited echocardiogram shows preserved EF..   Morbid obesity (HCC) Body mass index is 83.46 kg/m.  Lifestyle modifications. Follow-up closely PCP and/or bariatric clinic.   OSA on CPAP Continue CPAP at bedtime and as needed. Sleep study has been not repeated in last 10 years.   Suspect CPAP therapy is not adequate given his body habitus. Continuing BiPAP/NIV in the hospital. Would recommend NIV outside of the hospital as well.   Prediabetes Hemoglobin A1c 6.0%.   Cellulitis Improved. Continue linezolid 600 mg p.o. twice daily. Technically patient was started on Zyvox as vancomycin was not an option given his weight for weight bolus protocol.  But this was only initiated as empirical treatment.  Recommend patient to use narrow spectrum antibiotics going forward. Patient has been on doxycycline intermittently.  Consultants:  Cardiology  Procedures performed:  Echocardiogram  DISCHARGE MEDICATION: Allergies as of 03/28/2023       Reactions   Sulfa Antibiotics Hives, Rash        Medication List     TAKE these medications    acetaminophen 500 MG tablet Commonly known as: TYLENOL Take 1,500 mg by mouth as needed for moderate pain (pain score 4-6).   diltiazem 240 MG 24 hr capsule Commonly known as: CARDIZEM CD Take 1 capsule (240 mg total) by mouth daily.   Eliquis 5 MG Tabs tablet Generic drug: apixaban Take 1 tablet (5 mg total) by mouth 2 (two) times daily.   losartan 100 MG tablet Commonly known as: COZAAR Take 100  mg by mouth daily.   Melatonin 10 MG Tabs Take 10 mg by mouth at bedtime.   meloxicam 15 MG tablet Commonly known as: MOBIC TAKE 1 TABLET BY MOUTH EVERY DAY AS NEEDED FOR PAIN   potassium chloride SA 20 MEQ tablet Commonly known as: KLOR-CON M Take 1 tablet (20 mEq total) by mouth 2 (two) times daily.   torsemide 20 MG tablet Commonly known as:  DEMADEX Take 2 tablets (40 mg total) by mouth daily. What changed:  how much to take when to take this       ASK your doctor about these medications    linezolid 600 MG tablet Commonly known as: ZYVOX Take 600 mg by mouth 2 (two) times daily. Ask about: Should I take this medication?       Disposition: Home Diet recommendation: Cardiac diet  Discharge Exam: Vitals:   03/28/23 0900 03/28/23 1200 03/28/23 1300 03/28/23 1400  BP: (!) 177/121 136/69 (!) 151/127 (!) 165/86  Pulse: 85 88 94 99  Resp: (!) 25 18 14  (!) 21  Temp:  98.5 F (36.9 C) 97.8 F (36.6 C)   TempSrc:  Axillary Axillary   SpO2: 93% 96% 95% 92%  Weight:      Height:       General: Appear in mild distress; no visible Abnormal Neck Mass Or lumps, Conjunctiva normal Cardiovascular: S1 and S2 Present, no Murmur, Respiratory: good respiratory effort, Bilateral Air entry present and CTA, no Crackles, no wheezes Abdomen: Bowel Sound present, Non tender  Extremities: Chronic lymphedema Neurology: alert and oriented to time, place, and person  Filed Weights   03/25/23 0834  Weight: (!) 294.8 kg   Condition at discharge: stable  The results of significant diagnostics from this hospitalization (including imaging, microbiology, ancillary and laboratory) are listed below for reference.   Imaging Studies: ECHOCARDIOGRAM LIMITED Result Date: 03/26/2023    ECHOCARDIOGRAM LIMITED REPORT   Patient Name:   Willie Steele Date of Exam: 03/26/2023 Medical Rec #:  [id number]     Height:       74.0 in Accession #:    [id number]    Weight:       650.0 lb Date of Birth:  Apr 05, 1981     BSA:          3.587 m Patient Age:    42 years      BP:           174/128 mmHg Patient Gender: M             HR:           90 bpm. Exam Location:  Inpatient Procedure: 2D Echo and Intracardiac Opacification Agent Indications:    Aflutter/ Function  History:        Patient has prior history of Echocardiogram examinations, most                  recent 03/05/2023.  Sonographer:    Harriette Bouillon RDCS Referring Phys: [id number] CHRISTOPHER L SCHUMANN IMPRESSIONS  1. Limited echo for function with echo contrast. Even with contrast, acoustic windows are very limited. Apical windows show normal LVEF without apparent wall motion abnormalities. Left ventricular ejection fraction, by estimation, is 60 to 65%. The left  ventricle has normal function. The left ventricle has no regional wall motion abnormalities. FINDINGS  Left Ventricle: Limited echo for function with echo contrast. Even with contrast, acoustic windows are very limited. Apical windows show normal LVEF without apparent wall motion abnormalities. Left  ventricular ejection fraction, by estimation, is 60 to 65%. The left ventricle has normal function. The left ventricle has no regional wall motion abnormalities. Definity contrast agent was given IV to delineate the left ventricular endocardial borders. Jodelle Red MD Electronically signed by Jodelle Red MD Signature Date/Time: 03/26/2023/7:15:08 PM    Final    DG Chest Portable 1 View Result Date: 03/25/2023 CLINICAL DATA:  Shortness of breath EXAM: PORTABLE CHEST 1 VIEW COMPARISON:  03/04/2023 FINDINGS: Limited exam because of portable technique and body habitus. Heart is enlarged with central vascular congestion. Suspect mild bibasilar atelectasis. No large effusion or pneumothorax. Trachea midline. Monitor leads overlie the chest. Overall stable exam. IMPRESSION: Stable chest exam.  No interval change by plain radiography Electronically Signed   By: Judie Petit.  Shick M.D.   On: 03/25/2023 10:38   VAS [place] LOWER EXTREMITY VENOUS (DVT) Result Date: 03/05/2023  Lower Venous DVT Study Patient Name:  SILVESTER WITHROW  Date of Exam:   03/05/2023 Medical Rec #: [id number]      Accession #:    [id number] Date of Birth: [birth year]      Patient Gender: M Patient Age:   65 years Exam Location:  [facility] Procedure:      VAS [place] LOWER  EXTREMITY VENOUS (DVT) Referring Phys: [facility] --------------------------------------------------------------------------------  Indications: Swelling, and Edema.  Risk Factors: Recent extended travel obesity. Limitations: Body habitus, bandages and poor ultrasound/tissue interface. Comparison Study: No significant changes seen since prior exam 05/30/21 Performing Technologist: Shona Simpson  Examination Guidelines: A complete evaluation includes B-mode imaging, spectral Doppler, color Doppler, and power Doppler as needed of all accessible portions of each vessel. Bilateral testing is considered an integral part of a complete examination. Limited examinations for reoccurring indications may be performed as noted. The reflux portion of the exam is performed with the patient in reverse Trendelenburg.  +---------+---------------+---------+-----------+----------+-------------------+ RIGHT    CompressibilityPhasicitySpontaneityPropertiesThrombus Aging      +---------+---------------+---------+-----------+----------+-------------------+ CFV                                                   Not well visualized +---------+---------------+---------+-----------+----------+-------------------+ SFJ                                                   Not well visualized +---------+---------------+---------+-----------+----------+-------------------+ FV Prox  Full                                                             +---------+---------------+---------+-----------+----------+-------------------+ FV Mid   Full           Yes      Yes                                      +---------+---------------+---------+-----------+----------+-------------------+ FV Distal               Yes      Yes  Patent by color     +---------+---------------+---------+-----------+----------+-------------------+ PFV                                                   Not well  visualized +---------+---------------+---------+-----------+----------+-------------------+ POP                     Yes      Yes                  Patent by color     +---------+---------------+---------+-----------+----------+-------------------+ PTV                                                   Patent by color     +---------+---------------+---------+-----------+----------+-------------------+ PERO                                                  Not well visualized +---------+---------------+---------+-----------+----------+-------------------+   +---------+---------------+---------+-----------+----------+-------------------+ LEFT     CompressibilityPhasicitySpontaneityPropertiesThrombus Aging      +---------+---------------+---------+-----------+----------+-------------------+ CFV                                                   Not well visualized +---------+---------------+---------+-----------+----------+-------------------+ SFJ                                                   Not well visualized +---------+---------------+---------+-----------+----------+-------------------+ FV Prox  Full                                                             +---------+---------------+---------+-----------+----------+-------------------+ FV Mid   Full           Yes      Yes                                      +---------+---------------+---------+-----------+----------+-------------------+ FV Distal               Yes      Yes                                      +---------+---------------+---------+-----------+----------+-------------------+ PFV                                                   Not well visualized +---------+---------------+---------+-----------+----------+-------------------+ POP  Full           Yes      Yes                                       +---------+---------------+---------+-----------+----------+-------------------+ PTV                                                   Not well visualized +---------+---------------+---------+-----------+----------+-------------------+ PERO                                                  Not well visualized +---------+---------------+---------+-----------+----------+-------------------+     Summary: RIGHT: - There is no evidence of deep vein thrombosis in the lower extremity.  - No cystic structure found in the popliteal fossa.  LEFT: - There is no evidence of deep vein thrombosis in the lower extremity.  - No cystic structure found in the popliteal fossa.  *See table(s) above for measurements and observations. Electronically signed by Carolynn Sayers on 03/05/2023 at 4:06:06 PM.    Final    ECHOCARDIOGRAM COMPLETE Result Date: 03/05/2023    ECHOCARDIOGRAM REPORT   Patient Name:   JEPTHA LAMANCE Date of Exam: 03/05/2023 Medical Rec #:  [id number]     Height:       74.0 in Accession #:    [id number]    Weight:       675.0 lb Date of Birth:  Sep 07, 1980     BSA:          3.645 m Patient Age:    42 years      BP:           142/64 mmHg Patient Gender: M             HR:           89 bpm. Exam Location:  Inpatient Procedure: 2D Echo, Cardiac Doppler and Color Doppler Indications:    CHF- Acute Diastolic I50.31  History:        Patient has prior history of Echocardiogram examinations, most                 recent 05/30/2021. CHF and Cardiomegaly; Risk                 Factors:Hypertension and Sleep Apnea.  Sonographer:    Lucendia Herrlich RCS Referring Phys: [phone] DANIEL V THOMPSON  Sonographer Comments: Suboptimal parasternal window, suboptimal subcostal window, no apical window and patient is obese. TDS due to morbid obesity, over 600 pounds. IMPRESSIONS  1. Cannot assess LVF or wall motion due to inability to visualize endocardial segments from poor acoustical windows. Left ventricular endocardial border not  optimally defined to evaluate regional wall motion. Left ventricular diastolic function could not be evaluated.  2. Right ventricular systolic function was not well visualized. The right ventricular size is not well visualized. There is normal pulmonary artery systolic pressure. The estimated right ventricular systolic pressure is 26.2 mmHg.  3. The mitral valve is normal in structure. No evidence of mitral valve regurgitation. No evidence of mitral stenosis.  4. The aortic valve was not well visualized. Aortic valve  regurgitation is not visualized.  5. The inferior vena cava is normal in size with greater than 50% respiratory variability, suggesting right atrial pressure of 3 mmHg. FINDINGS  Left Ventricle: Left ventricular ejection fraction, by estimation, is Cannot assess due to inability to visualize endocardial segments from poor acoustical windows%. The left ventricle has Cannot assess function. Left ventricular endocardial border not optimally defined to evaluate regional wall motion. The left ventricular internal cavity size was cannot assess. There is no left ventricular hypertrophy. Left ventricular diastolic function could not be evaluated. Right Ventricle: The right ventricular size is not well visualized. Right vetricular wall thickness was not well visualized. Right ventricular systolic function was not well visualized. There is normal pulmonary artery systolic pressure. The tricuspid regurgitant velocity is 1.67 m/s, and with an assumed right atrial pressure of 15 mmHg, the estimated right ventricular systolic pressure is 26.2 mmHg. Left Atrium: Left atrial size was not well visualized. Right Atrium: Right atrial size was not well visualized. Pericardium: There is no evidence of pericardial effusion. Mitral Valve: The mitral valve is normal in structure. No evidence of mitral valve regurgitation. No evidence of mitral valve stenosis. Tricuspid Valve: The tricuspid valve is not well visualized.  Tricuspid valve regurgitation is trivial. No evidence of tricuspid stenosis. Aortic Valve: The aortic valve was not well visualized. Aortic valve regurgitation is not visualized. Pulmonic Valve: The pulmonic valve was not well visualized. Pulmonic valve regurgitation is not visualized. No evidence of pulmonic stenosis. Aorta: The aortic root is normal in size and structure. Venous: The inferior vena cava is normal in size with greater than 50% respiratory variability, suggesting right atrial pressure of 3 mmHg. IAS/Shunts: No atrial level shunt detected by color flow Doppler.  IVC IVC diam: 3.40 cm  AORTA Ao Asc diam: 3.65 cm TRICUSPID VALVE TR Peak grad:   11.2 mmHg TR Vmax:        167.00 cm/s Armanda Magic MD Electronically signed by Armanda Magic MD Signature Date/Time: 03/05/2023/1:03:11 PM    Final    DG Tibia/Fibula Left Port Result Date: 03/04/2023 CLINICAL DATA:  Infection, left leg cellulitis. EXAM: PORTABLE LEFT TIBIA AND FIBULA - 2 VIEW COMPARISON:  None Available. FINDINGS: Cortical margins of the tibia and fibular intact. No fracture. No erosive or bony destructive change. No knee or ankle dislocation. Generalized subcutaneous edema versus habitus. Scattered ill-defined soft tissue calcifications. No soft tissue gas or radiopaque foreign body. IMPRESSION: 1. Generalized subcutaneous edema versus habitus. No soft tissue gas or radiopaque foreign body. 2. No radiographic evidence of osteomyelitis. Electronically Signed   By: Narda Rutherford M.D.   On: 03/04/2023 16:01   DG Chest Portable 1 View Result Date: 03/04/2023 CLINICAL DATA:  Shortness of breath. EXAM: PORTABLE CHEST 1 VIEW COMPARISON:  05/29/2021 FINDINGS: Stable prominent cardiac silhouette. Peribronchial thickening without focal airspace disease. No pleural effusion. No pneumothorax. More detailed assessment is limited due to soft tissue attenuation from habitus. IMPRESSION: 1. Peribronchial thickening without focal airspace disease. 2.  Stable prominent cardiac silhouette. Electronically Signed   By: Narda Rutherford M.D.   On: 03/04/2023 16:00    Microbiology: Results for orders placed or performed during the hospital encounter of 03/25/23  Blood Culture (routine x 2)     Status: None   Collection Time: 03/25/23  9:00 AM   Specimen: BLOOD RIGHT ARM  Result Value Ref Range Status   Specimen Description   Final    BLOOD RIGHT ARM Performed at [facility], [address], [place], [place] [address]    Special Requests   Final    BOTTLES DRAWN AEROBIC AND ANAEROBIC Blood Culture adequate volume Performed at [facility], [address] [place], [place] [address]    Culture   Final    NO GROWTH 5 DAYS Performed at [facility], [address], [place], [place] [address]    Report Status 03/30/2023 FINAL  Final  Blood Culture (routine x 2)     Status: None   Collection Time: 03/25/23  9:00 AM   Specimen: BLOOD LEFT ARM  Result Value Ref Range Status   Specimen Description   Final    BLOOD LEFT ARM Performed at [facility], [address] [place], [place] [address]    Special Requests   Final    BOTTLES DRAWN AEROBIC AND ANAEROBIC Blood Culture adequate volume Performed at [facility], [address] [place], [place] [address]    Culture   Final    NO GROWTH 5 DAYS Performed at [facility], [address] [place], [place] [address]    Report Status 03/30/2023 FINAL  Final  Resp panel by RT-PCR (RSV, Flu A&B, Covid) Nasal Mucosa     Status: None   Collection Time: 03/25/23  9:03 AM   Specimen: Nasal Mucosa; Nasal Swab  Result Value Ref Range Status   SARS Coronavirus 2 by RT PCR NEGATIVE NEGATIVE Final    Comment: (NOTE) SARS-CoV-2 target nucleic acids are NOT DETECTED.  The SARS-CoV-2 RNA is generally detectable in upper respiratory specimens during the acute phase of infection. The lowest concentration of SARS-CoV-2 viral copies this  assay can detect is 138 copies/mL. A negative result does not preclude SARS-Cov-2 infection and should not be used as the sole basis for treatment or other patient management decisions. A negative result may occur with  improper specimen collection/handling, submission of specimen other than nasopharyngeal swab, presence of viral mutation(s) within the areas targeted by this assay, and inadequate number of viral copies(<138 copies/mL). A negative result must be combined with clinical observations, patient history, and epidemiological information. The expected result is Negative.  Fact Sheet for Patients:  BloggerCourse.com  Fact Sheet for Healthcare Providers:  SeriousBroker.it  This test is no t yet approved or cleared by the [place] FDA and  has been authorized for detection and/or diagnosis of SARS-CoV-2 by FDA under an Emergency Use Authorization (EUA). This EUA will remain  in effect (meaning this test can be used) for the duration of the COVID-19 declaration under Section 564(b)(1) of the Act, 21 U.S.C.section 360bbb-3(b)(1), unless the authorization is terminated  or revoked sooner.       Influenza A by PCR NEGATIVE NEGATIVE Final   Influenza B by PCR NEGATIVE NEGATIVE Final    Comment: (NOTE) The Xpert Xpress SARS-CoV-2/FLU/RSV plus assay is intended as an aid in the diagnosis of influenza from Nasopharyngeal swab specimens and should not be used as a sole basis for treatment. Nasal washings and aspirates are unacceptable for Xpert Xpress SARS-CoV-2/FLU/RSV testing.  Fact Sheet for Patients: BloggerCourse.com  Fact Sheet for Healthcare Providers: SeriousBroker.it  This test is not yet approved or cleared by the [place] FDA and has been authorized for detection and/or diagnosis of SARS-CoV-2 by FDA under an Emergency Use Authorization (EUA). This EUA will  remain in effect (meaning this test can be used) for the duration of the COVID-19 declaration under Section 564(b)(1) of the Act, 21  U.S.C. section 360bbb-3(b)(1), unless the authorization is terminated or revoked.     Resp Syncytial Virus by PCR NEGATIVE NEGATIVE Final    Comment: (NOTE) Fact Sheet for Patients: BloggerCourse.com  Fact Sheet for Healthcare Providers: SeriousBroker.it  This test is not yet approved or cleared by the [place] FDA and has been authorized for detection and/or diagnosis of SARS-CoV-2 by FDA under an Emergency Use Authorization (EUA). This EUA will remain in effect (meaning this test can be used) for the duration of the COVID-19 declaration under Section 564(b)(1) of the Act, 21 U.S.C. section 360bbb-3(b)(1), unless the authorization is terminated or revoked.  Performed at [facility], [address], [place], [place] [address]   MRSA Next Gen by PCR, Nasal     Status: None   Collection Time: 03/25/23  9:11 AM   Specimen: Nasal Mucosa; Nasal Swab  Result Value Ref Range Status   MRSA by PCR Next Gen NOT DETECTED NOT DETECTED Final    Comment: (NOTE) The GeneXpert MRSA Assay (FDA approved for NASAL specimens only), is one component of a comprehensive MRSA colonization surveillance program. It is not intended to diagnose MRSA infection nor to guide or monitor treatment for MRSA infections. Test performance is not FDA approved in patients less than 18 years old. Performed at [facility], [address], [place], [place] [address]    Labs: CBC: Recent Labs  Lab 03/25/23 0907 03/26/23 0102  WBC 9.9 9.4  NEUTROABS 7.8*  --   HGB 13.8 13.4  HCT 45.7 43.4  MCV 98.7 101.2*  PLT 262 224   Basic Metabolic Panel: Recent Labs  Lab 03/25/23 0907 03/25/23 1100 03/26/23 0102 03/27/23 0306 03/28/23 0323  NA 137  --  136 134* 134*  K 3.7  --  3.9 3.8  3.4*  CL 103  --  103 99 99  CO2 24  --  24 28 29   GLUCOSE 133*  --  119* 130* 136*  BUN 21*  --  17 15 16   CREATININE 0.94  --  0.78 0.66 0.69  CALCIUM 9.0  --  8.7* 8.5* 8.5*  MG  --  1.9 2.2 2.1 2.1  PHOS  --  3.7  --   --   --    Liver Function Tests: Recent Labs  Lab 03/25/23 0907 03/26/23 0102  AST 24 17  ALT 38 33  ALKPHOS 68 63  BILITOT 0.6 0.7  PROT 8.0 7.4  ALBUMIN 3.4* 3.1*   CBG: Recent Labs  Lab 03/25/23 1345  GLUCAP 110*    Discharge time spent: greater than 30 minutes.  Author: Lynden Oxford, MD  [facility] 03/28/2023

## 2023-03-31 ENCOUNTER — Telehealth (HOSPITAL_COMMUNITY): Payer: Self-pay | Admitting: Pharmacy Technician

## 2023-03-31 NOTE — Telephone Encounter (Signed)
Pharmacy Patient Advocate Encounter  Received notification from [facility] that Prior Authorization for Eliquis 5MG  tablets has been APPROVED from 03/31/2023 to 03/29/2024   PA #/Case ID/Reference #: [id number] Key: [id number]

## 2023-04-09 ENCOUNTER — Ambulatory Visit (HOSPITAL_COMMUNITY)
Admit: 2023-04-09 | Discharge: 2023-04-09 | Disposition: A | Discharge status: Home / Self Care | Payer: Managed Care, Other (non HMO) | Source: Ambulatory Visit | Attending: Internal Medicine | Admitting: Internal Medicine

## 2023-04-09 ENCOUNTER — Encounter (HOSPITAL_COMMUNITY): Payer: Self-pay | Admitting: Internal Medicine

## 2023-04-09 VITALS — BP 148/100 | HR 123 | Ht 74.0 in | Wt >= 6400 oz

## 2023-04-09 DIAGNOSIS — G4733 Obstructive sleep apnea (adult) (pediatric): Secondary | ICD-10-CM | POA: Insufficient documentation

## 2023-04-09 DIAGNOSIS — I110 Hypertensive heart disease with heart failure: Secondary | ICD-10-CM | POA: Diagnosis not present

## 2023-04-09 DIAGNOSIS — Z7901 Long term (current) use of anticoagulants: Secondary | ICD-10-CM | POA: Diagnosis not present

## 2023-04-09 DIAGNOSIS — I480 Paroxysmal atrial fibrillation: Secondary | ICD-10-CM | POA: Diagnosis not present

## 2023-04-09 DIAGNOSIS — E8881 Metabolic syndrome: Secondary | ICD-10-CM | POA: Insufficient documentation

## 2023-04-09 DIAGNOSIS — E559 Vitamin D deficiency, unspecified: Secondary | ICD-10-CM | POA: Diagnosis not present

## 2023-04-09 DIAGNOSIS — I5022 Chronic systolic (congestive) heart failure: Secondary | ICD-10-CM | POA: Diagnosis not present

## 2023-04-09 DIAGNOSIS — D6869 Other thrombophilia: Secondary | ICD-10-CM | POA: Diagnosis not present

## 2023-04-09 DIAGNOSIS — I4892 Unspecified atrial flutter: Secondary | ICD-10-CM | POA: Diagnosis present

## 2023-04-09 LAB — CBC
HCT: 43.8 % (ref 39.0–52.0)
Hemoglobin: 13.4 g/dL (ref 13.0–17.0)
MCH: 30.1 pg (ref 26.0–34.0)
MCHC: 30.6 g/dL (ref 30.0–36.0)
MCV: 98.4 fL (ref 80.0–100.0)
Platelets: 242 10*3/uL (ref 150–400)
RBC: 4.45 MIL/uL (ref 4.22–5.81)
RDW: 15.9 % — ABNORMAL HIGH (ref 11.5–15.5)
WBC: 7.9 10*3/uL (ref 4.0–10.5)
nRBC: 0 % (ref 0.0–0.2)

## 2023-04-09 MED ORDER — RIVAROXABAN 20 MG PO TABS: 20.0000 mg | ORAL_TABLET | Freq: Every day | ORAL | 4 refills | Status: DC

## 2023-04-09 MED ORDER — DILTIAZEM HCL ER COATED BEADS 360 MG PO CP24: 360.0000 mg | ORAL_CAPSULE | Freq: Every day | ORAL | 3 refills | Status: DC

## 2023-04-09 NOTE — H&P (View-Only) (Signed)
 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 male with a history of morbid obesity, chronic diastolic heart failure, OSA on CPAP, HTN, hypertriglyceridemia, metabolic syndrome, vitamin D deficiency, prediabetes, cellulitis, and paroxysmal atrial fibrillation who presents for consultation in the [facility]. Recent hospital admission 12/11-14/24 for new onset atrial flutter with RVR. He was recently treated previously to this admission for cellulitis with linezolid. Placed on diltiazem 240 mg daily. Noted to be in atrial flutter at time of discharge. Patient is on Eliquis 5 mg BID for a CHADS2VASC score of 2.  On evaluation today, he is currently in atrial flutter with RVR. Patient notes he does not have cardiac awareness of arrhythmia. He did state on day of episode noted his Apple watch told him his HR was in the 130s and he felt presyncopal upon standing. He has been compliant with Eliquis. He had bloodwork through outside institution on 12/23 (Cmet - K 4.1, creatinine 0.81). He has noted significant improvement in feeling better with taking torsemide and his new BiPAP.   Today, he denies symptoms of palpitations, chest pain, shortness of breath, orthopnea, PND, lower extremity edema, syncope, snoring, daytime somnolence, bleeding, or neurologic sequela. The patient is tolerating medications without difficulties and is otherwise without complaint today.    Atrial Fibrillation Risk Factors:  he does have symptoms or diagnosis of sleep apnea. he is compliant with CPAP therapy.  he has a BMI of Body mass index is 82.43 kg/m.Marland Kitchen Filed Weights   04/09/23 0957  Weight: (!) 291.2 kg    Current Outpatient Medications  Medication Sig Dispense Refill   acetaminophen (TYLENOL) 500 MG tablet Take 1,500 mg by mouth as needed for moderate pain (pain  score 4-6).     Melatonin 10 MG TABS Take 10 mg by mouth at bedtime.     potassium chloride SA (KLOR-CON M) 20 MEQ tablet Take 1 tablet (20 mEq total) by mouth 2 (two) times daily. 30 tablet 0   rivaroxaban (XARELTO) 20 MG TABS tablet Take 1 tablet (20 mg total) by mouth daily with supper. 30 tablet 4   torsemide (DEMADEX) 20 MG tablet Take 2 tablets (40 mg total) by mouth daily. 60 tablet 0   diltiazem (CARDIZEM CD) 360 MG 24 hr capsule Take 1 capsule (360 mg total) by mouth daily. 30 capsule 3   No current facility-administered medications for this encounter.    Atrial Fibrillation Management history:  Previous antiarrhythmic drugs: none Previous cardioversions: none Previous ablations: none Anticoagulation history: Eliquis 5 mg BID   ROS- All systems are reviewed and negative except as per the HPI above.  Physical Exam: BP (!) 148/100   Pulse (!) 123   Ht 6\' 2"  (1.88 m)   Wt (!) 291.2 kg   BMI 82.43 kg/m   GEN: Well nourished, well developed in no acute distress NECK: No JVD; No carotid bruits CARDIAC: Regular tachycardic rate (atrial flutter), no murmurs, rubs, gallops RESPIRATORY:  Clear to auscultation without rales, wheezing or rhonchi  ABDOMEN: Soft, non-tender, non-distended EXTREMITIES:  No edema; No deformity   EKG today demonstrates  Vent. rate 123 BPM PR interval * ms QRS duration 84 ms QT/QTcB 312/446 ms P-R-T axes 255 28 56 Atrial flutter with 2:1 A-V conduction Low voltage QRS Nonspecific ST abnormality Abnormal ECG When compared with ECG of 25-Mar-2023 13:37, PREVIOUS ECG IS  PRESENT  Echo 03/26/23 demonstrated  1. Limited echo for function with echo contrast. Even with contrast,  acoustic windows are very limited. Apical windows show normal LVEF without  apparent wall motion abnormalities. Left ventricular ejection fraction, by  estimation, is 60 to 65%. The left   ventricle has normal function. The left ventricle has no regional wall  motion  abnormalities.    ASSESSMENT & PLAN CHA2DS2-VASc Score = 2  The patient's score is based upon: CHF History: 1 HTN History: 1 Diabetes History: 0 Stroke History: 0 Vascular Disease History: 0 Age Score: 0 Gender Score: 0       ASSESSMENT AND PLAN: Persistent Atrial Flutter The patient's CHA2DS2-VASc score is 2, indicating a 2.2% annual risk of stroke.    He is in atrial flutter with RVR. We discussed anticoagulation and after speaking with pharmacist will transition patient today from Eliquis to Xarelto. Specifically, I discussed with pharmacy how Xarelto has been studied more so than Eliquis with regards to anticoagulation in the setting of increased body habitus. Due to this, out of safety for the patient will begin Xarelto 20 mg daily. We discussed treatment options and how cardioversion is a procedure done outpatient to help try to convert patients to normal rhythm with electrical energy. Patient agrees to this and will draw CBC today. Outside Cmet on 12/23 reviewed and stable. We also briefly discussed if patient has ERAF there are medication options for treatment in the form of rhythm control. Will increase diltiazem to 360 mg daily to try and improve rate control.   Informed Consent   Shared Decision Making/Informed Consent The risks (stroke, cardiac arrhythmias rarely resulting in the need for a temporary or permanent pacemaker, skin irritation or burns and complications associated with conscious sedation including aspiration, arrhythmia, respiratory failure and death), benefits (restoration of normal sinus rhythm) and alternatives of a direct current cardioversion were explained in detail to Mr. Chand and he agrees to proceed.      Secondary Hypercoagulable State (ICD10:  D68.69) The patient is at significant risk for stroke/thromboembolism based upon his CHA2DS2-VASc Score of 2.  Start Rivaroxaban (Xarelto).  Xarelto 20 mg daily to begin today.    Follow up as scheduled  with cardiology.    Lake Bells, PA-C  [facility], [place] [address] [place], [place] [address] [phone]

## 2023-04-09 NOTE — Progress Notes (Addendum)
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 male with a history of morbid obesity, chronic diastolic heart failure, OSA on CPAP, HTN, hypertriglyceridemia, metabolic syndrome, vitamin D deficiency, prediabetes, cellulitis, and paroxysmal atrial fibrillation who presents for consultation in the [facility]. Recent hospital admission 12/11-14/24 for new onset atrial flutter with RVR. He was recently treated previously to this admission for cellulitis with linezolid. Placed on diltiazem 240 mg daily. Noted to be in atrial flutter at time of discharge. Patient is on Eliquis 5 mg BID for a CHADS2VASC score of 2.  On evaluation today, he is currently in atrial flutter with RVR. Patient notes he does not have cardiac awareness of arrhythmia. He did state on day of episode noted his Apple watch told him his HR was in the 130s and he felt presyncopal upon standing. He has been compliant with Eliquis. He had bloodwork through outside institution on 12/23 (Cmet - K 4.1, creatinine 0.81). He has noted significant improvement in feeling better with taking torsemide and his new BiPAP.   Today, he denies symptoms of palpitations, chest pain, shortness of breath, orthopnea, PND, lower extremity edema, syncope, snoring, daytime somnolence, bleeding, or neurologic sequela. The patient is tolerating medications without difficulties and is otherwise without complaint today.    Atrial Fibrillation Risk Factors:  he does have symptoms or diagnosis of sleep apnea. he is compliant with CPAP therapy.  he has a BMI of Body mass index is 82.43 kg/m.Marland Kitchen Filed Weights   04/09/23 0957  Weight: (!) 291.2 kg    Current Outpatient Medications  Medication Sig Dispense Refill   acetaminophen (TYLENOL) 500 MG tablet Take 1,500 mg by mouth as needed for moderate pain (pain  score 4-6).     Melatonin 10 MG TABS Take 10 mg by mouth at bedtime.     potassium chloride SA (KLOR-CON M) 20 MEQ tablet Take 1 tablet (20 mEq total) by mouth 2 (two) times daily. 30 tablet 0   rivaroxaban (XARELTO) 20 MG TABS tablet Take 1 tablet (20 mg total) by mouth daily with supper. 30 tablet 4   torsemide (DEMADEX) 20 MG tablet Take 2 tablets (40 mg total) by mouth daily. 60 tablet 0   diltiazem (CARDIZEM CD) 360 MG 24 hr capsule Take 1 capsule (360 mg total) by mouth daily. 30 capsule 3   No current facility-administered medications for this encounter.    Atrial Fibrillation Management history:  Previous antiarrhythmic drugs: none Previous cardioversions: none Previous ablations: none Anticoagulation history: Eliquis 5 mg BID   ROS- All systems are reviewed and negative except as per the HPI above.  Physical Exam: BP (!) 148/100   Pulse (!) 123   Ht 6\' 2"  (1.88 m)   Wt (!) 291.2 kg   BMI 82.43 kg/m   GEN: Well nourished, well developed in no acute distress NECK: No JVD; No carotid bruits CARDIAC: Regular tachycardic rate (atrial flutter), no murmurs, rubs, gallops RESPIRATORY:  Clear to auscultation without rales, wheezing or rhonchi  ABDOMEN: Soft, non-tender, non-distended EXTREMITIES:  No edema; No deformity   EKG today demonstrates  Vent. rate 123 BPM PR interval * ms QRS duration 84 ms QT/QTcB 312/446 ms P-R-T axes 255 28 56 Atrial flutter with 2:1 A-V conduction Low voltage QRS Nonspecific ST abnormality Abnormal ECG When compared with ECG of 25-Mar-2023 13:37, PREVIOUS ECG IS  PRESENT  Echo 03/26/23 demonstrated  1. Limited echo for function with echo contrast. Even with contrast,  acoustic windows are very limited. Apical windows show normal LVEF without  apparent wall motion abnormalities. Left ventricular ejection fraction, by  estimation, is 60 to 65%. The left   ventricle has normal function. The left ventricle has no regional wall  motion  abnormalities.    ASSESSMENT & PLAN CHA2DS2-VASc Score = 2  The patient's score is based upon: CHF History: 1 HTN History: 1 Diabetes History: 0 Stroke History: 0 Vascular Disease History: 0 Age Score: 0 Gender Score: 0       ASSESSMENT AND PLAN: Persistent Atrial Flutter The patient's CHA2DS2-VASc score is 2, indicating a 2.2% annual risk of stroke.    He is in atrial flutter with RVR. We discussed anticoagulation and after speaking with pharmacist will transition patient today from Eliquis to Xarelto. Specifically, I discussed with pharmacy how Xarelto has been studied more so than Eliquis with regards to anticoagulation in the setting of increased body habitus. Due to this, out of safety for the patient will begin Xarelto 20 mg daily. We discussed treatment options and how cardioversion is a procedure done outpatient to help try to convert patients to normal rhythm with electrical energy. Patient agrees to this and will draw CBC today. Outside Cmet on 12/23 reviewed and stable. We also briefly discussed if patient has ERAF there are medication options for treatment in the form of rhythm control. Will increase diltiazem to 360 mg daily to try and improve rate control.   Informed Consent   Shared Decision Making/Informed Consent The risks (stroke, cardiac arrhythmias rarely resulting in the need for a temporary or permanent pacemaker, skin irritation or burns and complications associated with conscious sedation including aspiration, arrhythmia, respiratory failure and death), benefits (restoration of normal sinus rhythm) and alternatives of a direct current cardioversion were explained in detail to Mr. Chand and he agrees to proceed.      Secondary Hypercoagulable State (ICD10:  D68.69) The patient is at significant risk for stroke/thromboembolism based upon his CHA2DS2-VASc Score of 2.  Start Rivaroxaban (Xarelto).  Xarelto 20 mg daily to begin today.    Follow up as scheduled  with cardiology.    Lake Bells, PA-C  [facility], [place] [address] [place], [place] [address] [phone]

## 2023-04-09 NOTE — Patient Instructions (Addendum)
Stop eliquis   Start Xarelto 20mg  once a day with supper  Increase cardizem to 360mg  once a day    Cardioversion scheduled for: Friday, January 17th    - Arrive at the [facility] and go to admitting at [facility] not eat or drink anything after midnight the night prior to your procedure.   - Take all your morning medication (except diabetic medications) with a sip of water prior to arrival.  - You will not be able to drive home after your procedure.    - Do NOT miss any doses of your blood thinner - if you should miss a dose please notify our office immediately.   - If you feel as if you go back into normal rhythm prior to scheduled cardioversion, please notify our office immediately.   If your procedure is canceled in the cardioversion suite you will be charged a cancellation fee.    Hold below medications 7 days prior to scheduled procedure/anesthesia.  Restart medication on the normal dosing day after scheduled procedure/anesthesia  Dulaglutide (Trulicity) Exenatide extended release (Bydureon bcise) Semaglutide (Ozempic) (WEGOVY)  Tirzepatide (Mounjaro)     Hold below medications 72 hours prior to scheduled procedure/anesthesia. Restart medication on the following day after scheduled procedure/anesthesia Bexagliflozin (Brenzavvy) Canagliflozin (Invokana) Canagliflozin/metformin (Invokamet/Invokamet XR) Dapagliflozin Marcelline Deist) Dapagliflozin/metformin (Xigduo XR) Dapagliflozin/saxagliptin Colbert Coyer) Empagliflozin (Jardiance) Empagliflozin/linagliptin (Glyxambi) Empagliflozin/linagliptin/metformin (Trijardy XR) Empagliflozin/metformin (Synjardy/Synjardy XR) Ertugliflozin (Steglatro) Ertugliflozin/metformin (Segluromet) Ertugliflozin/sitagliptin (Steglujan)    Hold below medications 24 hours prior to scheduled procedure/anesthesia.   Restart medication on the following day after scheduled procedure/anesthesia   Exenatide (Byetta)  Liraglutide (Victoza,  Saxenda)  Lixisenatide (Adlyxin)  Semaglutide (Rybelsus) Polyethylene Glycol Loxenatide        For those patients who have a scheduled procedure/anesthesia on the same day of the week as their dose, hold the medication on the day of surgery.  They can take their scheduled dose the week before.  **Patients on the above medications scheduled for elective procedures that have not held the medication for the appropriate amount of time are at risk of cancellation or change in the anesthetic plan.

## 2023-04-30 NOTE — Anesthesia Preprocedure Evaluation (Addendum)
Anesthesia Evaluation  Patient identified by MRN, date of birth, ID band Patient awake    Reviewed: Allergy & Precautions, NPO status , Patient's Chart, lab work & pertinent test results  Airway Mallampati: II  TM Distance: >3 FB Neck ROM: Full    Dental no notable dental hx. (+) Dental Advisory Given, Teeth Intact   Pulmonary former smoker   Pulmonary exam normal breath sounds clear to auscultation       Cardiovascular hypertension, Pt. on medications +CHF  Normal cardiovascular exam Rhythm:Regular Rate:Normal  Echo 1. Limited echo for function with echo contrast. Even with contrast, acoustic windows are very limited. Apical windows show normal LVEF without apparent wall motion abnormalities. Left ventricular ejection fraction, by estimation, is 60 to 65%. The left ventricle has normal function. The left ventricle has no regional wall motion abnormalities.      Neuro/Psych negative neurological ROS     GI/Hepatic negative GI ROS, Neg liver ROS,,,  Endo/Other    Class 4 obesity  Renal/GU negative Renal ROS     Musculoskeletal negative musculoskeletal ROS (+)    Abdominal  (+) + obese  Peds  Hematology negative hematology ROS (+)   Anesthesia Other Findings   Reproductive/Obstetrics                             Anesthesia Physical Anesthesia Plan  ASA: 4  Anesthesia Plan: General   Post-op Pain Management: Minimal or no pain anticipated   Induction: Intravenous  PONV Risk Score and Plan: 2 and Propofol infusion, Treatment may vary due to age or medical condition and TIVA  Airway Management Planned: Natural Airway and Mask  Additional Equipment:   Intra-op Plan:   Post-operative Plan:   Informed Consent: I have reviewed the patients History and Physical, chart, labs and discussed the procedure including the risks, benefits and alternatives for the proposed anesthesia with the  patient or authorized representative who has indicated his/her understanding and acceptance.     Dental advisory given  Plan Discussed with: CRNA  Anesthesia Plan Comments: (Risks of anesthesia explained at length. This includes, but is not limited to, sore throat, damage to teeth, lips gums, tongue and vocal cords, nausea and vomiting, reactions to medications, stroke, heart attack, and death. All patient questions were answered and the patient wishes to proceed. )        Anesthesia Quick Evaluation

## 2023-04-30 NOTE — Progress Notes (Signed)
 Spoke to pt and instructed them to come at 0730 and to be NPO after 0000.  Confirmed no missed doses of AC and instructed to take in AM with a small sip of water.  Confirmed that pt will have a ride home and someone to stay with them for 24 hours after the procedure. Instructed patient to not wear any jewelry or lotion.

## 2023-05-01 ENCOUNTER — Ambulatory Visit (HOSPITAL_COMMUNITY): Payer: 59 | Admitting: Certified Registered"

## 2023-05-01 ENCOUNTER — Ambulatory Visit (HOSPITAL_COMMUNITY)
Admission: RE | Admit: 2023-05-01 | Discharge: 2023-05-01 | Disposition: A | Discharge status: Home / Self Care | Payer: 59 | Attending: Internal Medicine | Admitting: Internal Medicine

## 2023-05-01 ENCOUNTER — Ambulatory Visit (HOSPITAL_BASED_OUTPATIENT_CLINIC_OR_DEPARTMENT_OTHER): Payer: 59 | Admitting: Certified Registered"

## 2023-05-01 ENCOUNTER — Other Ambulatory Visit: Payer: Self-pay

## 2023-05-01 ENCOUNTER — Encounter (HOSPITAL_COMMUNITY)
Admission: RE | Disposition: A | Discharge status: Home / Self Care | Payer: Self-pay | Source: Home / Self Care | Attending: Internal Medicine

## 2023-05-01 DIAGNOSIS — I110 Hypertensive heart disease with heart failure: Secondary | ICD-10-CM | POA: Insufficient documentation

## 2023-05-01 DIAGNOSIS — R7303 Prediabetes: Secondary | ICD-10-CM | POA: Insufficient documentation

## 2023-05-01 DIAGNOSIS — G4733 Obstructive sleep apnea (adult) (pediatric): Secondary | ICD-10-CM | POA: Diagnosis not present

## 2023-05-01 DIAGNOSIS — I480 Paroxysmal atrial fibrillation: Secondary | ICD-10-CM | POA: Insufficient documentation

## 2023-05-01 DIAGNOSIS — Z79899 Other long term (current) drug therapy: Secondary | ICD-10-CM | POA: Diagnosis not present

## 2023-05-01 DIAGNOSIS — I4892 Unspecified atrial flutter: Secondary | ICD-10-CM

## 2023-05-01 DIAGNOSIS — D6869 Other thrombophilia: Secondary | ICD-10-CM | POA: Diagnosis not present

## 2023-05-01 DIAGNOSIS — Z87891 Personal history of nicotine dependence: Secondary | ICD-10-CM

## 2023-05-01 DIAGNOSIS — I5032 Chronic diastolic (congestive) heart failure: Secondary | ICD-10-CM | POA: Diagnosis not present

## 2023-05-01 DIAGNOSIS — Z6841 Body Mass Index (BMI) 40.0 and over, adult: Secondary | ICD-10-CM | POA: Diagnosis not present

## 2023-05-01 DIAGNOSIS — I5031 Acute diastolic (congestive) heart failure: Secondary | ICD-10-CM | POA: Diagnosis not present

## 2023-05-01 DIAGNOSIS — E781 Pure hyperglyceridemia: Secondary | ICD-10-CM | POA: Insufficient documentation

## 2023-05-01 DIAGNOSIS — Z7901 Long term (current) use of anticoagulants: Secondary | ICD-10-CM | POA: Insufficient documentation

## 2023-05-01 HISTORY — PX: CARDIOVERSION: EP1203

## 2023-05-01 LAB — POCT I-STAT, CHEM 8
BUN: 17 mg/dL (ref 6–20)
Calcium, Ion: 1.24 mmol/L (ref 1.15–1.40)
Chloride: 100 mmol/L (ref 98–111)
Creatinine, Ser: 0.9 mg/dL (ref 0.61–1.24)
Glucose, Bld: 118 mg/dL — ABNORMAL HIGH (ref 70–99)
HCT: 41 % (ref 39.0–52.0)
Hemoglobin: 13.9 g/dL (ref 13.0–17.0)
Potassium: 4 mmol/L (ref 3.5–5.1)
Sodium: 141 mmol/L (ref 135–145)
TCO2: 30 mmol/L (ref 22–32)

## 2023-05-01 SURGERY — CARDIOVERSION (CATH LAB)
Anesthesia: General

## 2023-05-01 MED ORDER — SODIUM CHLORIDE 0.9% FLUSH
3.0000 mL | Freq: Two times a day (BID) | INTRAVENOUS | Status: DC
Start: 2023-05-01 — End: 2023-05-01

## 2023-05-01 MED ORDER — SODIUM CHLORIDE 0.9% FLUSH: 3.0000 mL | INTRAVENOUS | Status: DC | PRN

## 2023-05-01 MED ORDER — KETAMINE HCL 50 MG/5ML IJ SOSY
PREFILLED_SYRINGE | INTRAMUSCULAR | Status: AC
  Filled 2023-05-01: qty 5

## 2023-05-01 MED ORDER — PROPOFOL 10 MG/ML IV BOLUS
INTRAVENOUS | Status: DC | PRN
  Administered 2023-05-01: 200 mg via INTRAVENOUS

## 2023-05-01 MED ORDER — SODIUM CHLORIDE 0.9 % IV SOLN: INTRAVENOUS | Status: DC | PRN

## 2023-05-01 MED ORDER — LIDOCAINE 2% (20 MG/ML) 5 ML SYRINGE
INTRAMUSCULAR | Status: DC | PRN
  Administered 2023-05-01: 100 mg via INTRAVENOUS

## 2023-05-01 SURGICAL SUPPLY — 1 items: PAD DEFIB RADIO PHYSIO CONN (PAD) ×1 IMPLANT

## 2023-05-01 NOTE — Anesthesia Postprocedure Evaluation (Signed)
Anesthesia Post Note  Patient: Public house manager  Procedure(s) Performed: CARDIOVERSION     Patient location during evaluation: PACU Anesthesia Type: General Level of consciousness: sedated and patient cooperative Pain management: pain level controlled Vital Signs Assessment: post-procedure vital signs reviewed and stable Respiratory status: spontaneous breathing Cardiovascular status: stable Anesthetic complications: no   No notable events documented.  Last Vitals:  Vitals:   05/01/23 0922 05/01/23 0925  BP: (!) 140/95   Pulse: (!) 104 (!) 108  Resp: (!) 21 14  Temp:    SpO2: 98% 97%    Last Pain:  Vitals:   05/01/23 0901  TempSrc: Temporal  PainSc: 0-No pain                 Lewie Loron

## 2023-05-01 NOTE — Transfer of Care (Signed)
Immediate Anesthesia Transfer of Care Note  Patient: Willie Steele  Procedure(s) Performed: CARDIOVERSION  Patient Location: PACU and Cath Lab  Anesthesia Type:General  Level of Consciousness: awake, alert , and oriented  Airway & Oxygen Therapy: Patient Spontanous Breathing and Patient connected to nasal cannula oxygen  Post-op Assessment: Report given to RN and Post -op Vital signs reviewed and stable  Post vital signs: Reviewed and stable  Last Vitals:  Vitals Value Taken Time  BP    Temp    Pulse    Resp    SpO2      Last Pain: There were no vitals filed for this visit.       Complications: No notable events documented.

## 2023-05-01 NOTE — Interval H&P Note (Signed)
History and Physical Interval Note:  05/01/2023 8:27 AM  Willie Steele  has presented today for surgery, with the diagnosis of AFIB.  The various methods of treatment have been discussed with the patient and family. After consideration of risks, benefits and other options for treatment, the patient has consented to  Procedure(s): CARDIOVERSION (N/A) as a surgical intervention.  The patient's history has been reviewed, patient examined, no change in status, stable for surgery.  I have reviewed the patient's chart and labs.  Questions were answered to the patient's satisfaction.     Dietrich Pates

## 2023-05-01 NOTE — CV Procedure (Signed)
Electrical Cardioversion Procedure Note Kert Makosky [id number] 05-Aug-1980  Procedure: Electrical Cardioversion Indications:  Atrial Flutter  Procedure Details Consent: Risks of procedure as well as the alternatives and risks of each were explained to the (patient/caregiver).  Consent for procedure obtained. Time Out: Verified patient identification, verified procedure, site/side was marked, verified correct patient position, special equipment/implants available, medications/allergies/relevent history reviewed, required imaging and test results available.  Performed  Patient placed on cardiac monitor, pulse oximetry, supplemental oxygen as necessary.  Sedation given:  Propofol Pacer pads placed  apex / base .  Cardioverted 1 time(s).  Cardioverted at 360J. biphasic  Evaluation Findings: Post procedure EKG shows: NSR Complications: None Patient did tolerate procedure well.   Dietrich Pates 05/01/2023, 8:55 AM

## 2023-05-04 ENCOUNTER — Encounter (HOSPITAL_COMMUNITY): Payer: Self-pay | Admitting: Internal Medicine

## 2023-05-06 NOTE — Progress Notes (Addendum)
Cardiology Office Note:    Date:  05/08/2023  ID:  [name], DOB 1980/10/02, MRN [id number] PCP: Tracey Harries, MD  [facility] Providers Cardiologist:  Little Ishikawa, MD       Patient Profile:     Willie Steele is a 43 y.o. male with visit-pertinent history of OSA, diastolic CHF, atrial flutter, super morbid obesity, HTN, metabolic syndrome, prediabetes.  He was admitted 05/2021 with new acute diastolic congestive heart failure and acute respiratory failure with hypoxia.  This was likely multifactorial secondary to fluid overload and superobesity.  BMI was noted to be 84. 2D echo 05/2021 nondiagnostic due to very poor echo windows and visualization of cardiac structures and unable to visualize LV endocardium however based on very limited views EF appeared to be within normal range of 55 to 65%. Intake and output noted with total of -40981 mL and diuresed almost 70 pounds of fluid .  He was changed from furosemide 20 mg to torsemide 20 mg daily on discharge and losartan has been added to the regimen.   He was admitted to the hospital from 03/04/2023 through 03/10/2023.  He was diagnosed with sepsis due to cellulitis, acute hypoxic respiratory failure, and acute on chronic diastolic CHF exacerbation.  Due to body habitus and weight unable to get CTA chest or V/Q study. Repeat 2D echo 02/2023 unable to assess LVEF or wall motion due to inability to visualize endocardial segments from poor acoustical windows.  Unable to optimally evaluate regional wall motion.  Left ventricular diastolic function cannot be evaluated. Noted to be -25.6 L during this hospitalization.   He established with cardiology service with Dr. Bjorn Pippin during an admission after he presented to the ED on 03/25/2023 with rapid HR and admitted with new onset atrial flutter with RVR.  ED EKG with 2:1 atrial flutter and ventricular rate 139 bpm.  BNP only 73 but CXR with some vascular congestion. Troponins 26, 38  consistent with demand ischemia in the setting of dyspnea/hypoxia. Limited echocardiogram 03/2023 showing acoustic windows are very limited due to body habitus, LVEF 60 to 65%, no RWMA.  Patient deemed not a good candidate for TEE/DCCV due to body habitus.  DCCV was agreed to be a better option after greater than 3 weeks of uninterrupted OAC.  He was to follow-up with A-fib clinic and d/c on Eliquis.  Was seen by A-fib clinic on 04/09/2023, he was in atrial flutter with RVR with HR 123bpm.  He was noted not to have cardiac awareness of arrhythmia.  He was transitioned from Eliquis to Xarelto, due to Xarelto being studied more so than Eliquis with regards to anticoagulation in the setting of increased body habitus.  Diltiazem was increased from 360 mg daily.  Cardioversion performed 05/01/2023 and was successful with 1 shock at 360J.      History of Present Illness:  Willie Steele is a 43 y.o. male who returns for follow-up post cardioversion for atrial flutter.  Today he arrives in the clinic alone.  He is without any major cardiovascular concerns or complaints at this time.  EKG shows 2:1 conduction atrial flutter with RVR HR 124 bpm.  He is unaware of his arrhythmia with no complaints of irregular heart rates, tachycardia, palpitations.  He has chronic SOB/DOE that he attributes to his obesity which he denies any worsening or exacerbation.  He is unsure of the exact moment that he converted back into atrial flutter.  He does note that his heart rates at home over  this past week have generally been in the 70s to 80s.  He attributes his initial heart rate elevation to his deconditioning, weight and the wall to our office today.  He has been attempting to begin GLP-1 therapy however is having issues with his insurance.  He denies chest pain, lower extremity edema, fatigue, palpitations, diaphoresis, weakness, presyncope, syncope, orthopnea, and PND.       Review of Systems  Constitutional: Negative for  weight gain and weight loss.  Cardiovascular:  Negative for chest pain, claudication, irregular heartbeat, leg swelling, near-syncope, orthopnea, palpitations, paroxysmal nocturnal dyspnea and syncope.  Respiratory:  Negative for cough.   Gastrointestinal:  Negative for abdominal pain.  Neurological:  Negative for dizziness and light-headedness.     See HPI     Home Medications:    Prior to Admission medications   Medication Sig Start Date End Date Taking? Authorizing Provider  acetaminophen (TYLENOL) 500 MG tablet Take 1,500 mg by mouth every 8 (eight) hours as needed for moderate pain (pain score 4-6).    [provider]  diltiazem (CARDIZEM CD) 360 MG 24 hr capsule Take 1 capsule (360 mg total) by mouth daily. 04/09/23   Eustace Pen, PA-C  Melatonin 10 MG TABS Take 10 mg by mouth at bedtime.    [provider]  methylcellulose oral powder Take by mouth daily. 30 ml in coffee or water    [provider]  potassium chloride SA (KLOR-CON M) 20 MEQ tablet Take 1 tablet (20 mEq total) by mouth 2 (two) times daily. 03/28/23   Rolly Salter, MD  rivaroxaban (XARELTO) 20 MG TABS tablet Take 1 tablet (20 mg total) by mouth daily with supper. 04/09/23   Eustace Pen, PA-C  torsemide (DEMADEX) 20 MG tablet Take 2 tablets (40 mg total) by mouth daily. 03/28/23   Rolly Salter, MD   Studies Reviewed:   EKG Interpretation Date/Time:  Friday May 08 2023 10:21:28 EST Ventricular Rate:  124 PR Interval:    QRS Duration:  142 QT Interval:  296 QTC Calculation: 425 R Axis:   31  Text Interpretation: Atrial flutter with 2:1 A-V conduction Non-specific intra-ventricular conduction block When compared with ECG of 01-May-2023 09:02, Atrial flutter has replaced Sinus rhythm Confirmed by Rise Paganini [id number] on 05/08/2023 11:38:54 AM   Echocardiogram 03/26/23  1. Limited echo for function with echo contrast. Even with contrast, acoustic windows are very  limited. Apical windows show normal LVEF without apparent wall motion abnormalities. Left ventricular ejection fraction, by estimation, is 60 to 65%. The left  ventricle has normal function. The left ventricle has no regional wall  motion abnormalities.  Echocardiogram 03/05/2023 1. Cannot assess LVF or wall motion due to inability to visualize  endocardial segments from poor acoustical windows. Left ventricular  endocardial border not optimally defined to evaluate regional wall motion.  Left ventricular diastolic function could  not be evaluated.   2. Right ventricular systolic function was not well visualized. The right  ventricular size is not well visualized. There is normal pulmonary artery  systolic pressure. The estimated right ventricular systolic pressure is  26.2 mmHg.   3. The mitral valve is normal in structure. No evidence of mitral valve  regurgitation. No evidence of mitral stenosis.   4. The aortic valve was not well visualized. Aortic valve regurgitation  is not visualized.   5. The inferior vena cava is normal in size with greater than 50%  respiratory variability, suggesting right atrial pressure  of 3 mmHg.   Risk Assessment/Calculations:    CHA2DS2-VASc Score = 2   This indicates a 2.2% annual risk of stroke. The patient's score is based upon: CHF History: 1 HTN History: 1 Diabetes History: 0 Stroke History: 0 Vascular Disease History: 0 Age Score: 0 Gender Score: 0            Physical Exam:   VS:  BP 134/82 (BP Location: Left Wrist, Patient Position: Sitting, Cuff Size: Large)   Pulse (!) 102   Ht 6\' 3"  (1.905 m)   Wt (!) 700 lb (317.5 kg)   SpO2 94%   BMI 87.49 kg/m    Wt Readings from Last 3 Encounters:  05/08/23 (!) 700 lb (317.5 kg)  04/09/23 (!) 642 lb (291.2 kg)  03/25/23 (!) 650 lb (294.8 kg)    Constitutional:      Appearance: Normal and healthy appearance. Not in distress.  Neck:     Vascular: JVD normal.  Pulmonary:     Effort:  Pulmonary effort is normal.     Breath sounds: Normal breath sounds.  Chest:     Chest wall: Not tender to palpatation.  Cardiovascular:     PMI at left midclavicular line. Normal rate. Irregularly irregular rhythm. Normal S1. Normal S2.      Murmurs: There is no murmur.     No gallop.  No click. No rub.  Pulses:    Intact distal pulses.  Edema:    Peripheral edema present.    Pretibial: bilateral 1+ edema of the pretibial area.    Ankle: bilateral 1+ edema of the ankle.    Feet: bilateral 1+ edema of the feet. Musculoskeletal: Normal range of motion.     Cervical back: Normal range of motion and neck supple. Skin:    General: Skin is warm and dry.  Neurological:     General: No focal deficit present.     Mental Status: Alert, oriented to person, place, and time and oriented to person, place and time.  Psychiatric:        Mood and Affect: Mood and affect normal.        Behavior: Behavior is cooperative.        Thought Content: Thought content normal.        Assessment and Plan:  Atrial flutter S/p successful cardioversion 05/01/2023 EKG today shows atrial flutter 2:1 RVR with HR 124bpm. He does not have cardiac awareness of arrhythmia. Elevated HR likely in the setting of deconditioning/obesity as he notes long walks such as to clinic today typically rise HR to 120s. At rest his HR improved to 90bpm. HRs at home this week averaged 70s-80s He has multiple risk factors for recurrent atrial flutter including obesity and OSA -Will add Metoprolol tartrate 25mg  BID -Continue Xarelto 20mg  daily and Diltiazem 360mg  daily -Plan to send to A-fib clinic in 1 week for consideration of another DCCV versus initiation of antiarrhythmic therapy. Not a good candidate for amiodarone given age and BMI.   CHA2DS2-VASc Score = 2 [CHF History: 1, HTN History: 1, Diabetes History: 0, Stroke History: 0, Vascular Disease History: 0, Age Score: 0, Gender Score: 0].  Therefore, the patient's annual risk of  stroke is 2.2 %.      Hypertension  BP today 134/82.  Not currently at goal of less than 130/80. -Continue monitoring blood pressure at home -Continue Diltiazem 360mg  daily -Plan to add metoprolol tartrate 25 mg twice daily  Acute on chronic HFpEF Limited echocardiogram with contrast  on 03/2023 shows LVEF 60-65%  Euvolemic and well compensated on exam No increase in chronic lower extremity edema, SOB, DOE Would not pursue SGLT2i given elevated BMI.  Will consider addition of MRA once atrial flutter is controlled -Continue torsemide 20 mg daily  OSA Reports continued adherence to CPAP  Obesity BMI 87.49 -Refer to PharmD for consideration of GLP1 therapy               Dispo: Follow-up with A-fib clinic in 1 week and follow-up with Dr. Bjorn Pippin in 3 months  Signed, Denyce Robert, NP

## 2023-05-08 ENCOUNTER — Encounter: Payer: Self-pay | Admitting: Physician Assistant

## 2023-05-08 ENCOUNTER — Ambulatory Visit: Payer: 59 | Attending: Physician Assistant | Admitting: Emergency Medicine

## 2023-05-08 VITALS — BP 134/82 | HR 102 | Ht 75.0 in | Wt >= 6400 oz

## 2023-05-08 DIAGNOSIS — I4892 Unspecified atrial flutter: Secondary | ICD-10-CM

## 2023-05-08 DIAGNOSIS — G4733 Obstructive sleep apnea (adult) (pediatric): Secondary | ICD-10-CM

## 2023-05-08 DIAGNOSIS — I5031 Acute diastolic (congestive) heart failure: Secondary | ICD-10-CM | POA: Diagnosis not present

## 2023-05-08 DIAGNOSIS — I10 Essential (primary) hypertension: Secondary | ICD-10-CM | POA: Diagnosis not present

## 2023-05-08 MED ORDER — METOPROLOL TARTRATE 25 MG PO TABS: 25.0000 mg | ORAL_TABLET | Freq: Two times a day (BID) | ORAL | 3 refills | Status: DC

## 2023-05-08 NOTE — Patient Instructions (Signed)
Medication Instructions:  START METOPROLOL TARTRATE 25 MG TWICE DAILY *If you need a refill on your cardiac medications before your next appointment, please call your pharmacy*   Lab Work: NO LABS If you have labs (blood work) drawn today and your tests are completely normal, you will receive your results only by: MyChart Message (if you have MyChart) OR A paper copy in the mail If you have any lab test that is abnormal or we need to change your treatment, we will call you to review the results.   Testing/Procedures: NO TESTING   Follow-Up: At [facility], you and your health needs are our priority.  As part of our continuing mission to provide you with exceptional heart care, we have created designated Provider Care Teams.  These Care Teams include your primary Cardiologist (physician) and Advanced Practice Providers (APPs -  Physician Assistants and Nurse Practitioners) who all work together to provide you with the care you need, when you need it.  Your next appointment:   3 month(s)  Provider:   Little Ishikawa, MD   Other Instructions You have been referred to Pharm-D for GLP-1    Needs appointment with Afib Clinic in 1 week

## 2023-05-18 ENCOUNTER — Ambulatory Visit (HOSPITAL_COMMUNITY): Payer: 59 | Admitting: Internal Medicine

## 2023-05-26 ENCOUNTER — Inpatient Hospital Stay (HOSPITAL_COMMUNITY): Admission: RE | Admit: 2023-05-26 | Payer: 59 | Source: Ambulatory Visit | Admitting: Internal Medicine

## 2023-06-08 ENCOUNTER — Encounter (HOSPITAL_COMMUNITY): Payer: Self-pay

## 2023-06-08 ENCOUNTER — Ambulatory Visit (HOSPITAL_COMMUNITY): Payer: 59 | Admitting: Internal Medicine

## 2023-06-08 NOTE — Progress Notes (Incomplete)
Primary Care Physician: Tracey Harries, MD Primary Cardiologist: Little Ishikawa, MD Electrophysiologist: None     Referring Physician: Dr. Charletta Cousin is a 43 y.o. male with a history of morbid obesity, chronic diastolic heart failure, OSA on CPAP, HTN, hypertriglyceridemia, metabolic syndrome, vitamin D deficiency, prediabetes, cellulitis, and paroxysmal atrial fibrillation who presents for consultation in the [facility]. Recent hospital admission 12/11-14/24 for new onset atrial flutter with RVR. He was recently treated previously to this admission for cellulitis with linezolid. Placed on diltiazem 240 mg daily. Noted to be in atrial flutter at time of discharge. Patient is on Eliquis 5 mg BID for a CHADS2VASC score of 2.  On evaluation today, he is currently in atrial flutter with RVR. Patient notes he does not have cardiac awareness of arrhythmia. He did state on day of episode noted his Apple watch told him his HR was in the 130s and he felt presyncopal upon standing. He has been compliant with Eliquis. He had bloodwork through outside institution on 12/23 (Cmet - K 4.1, creatinine 0.81). He has noted significant improvement in feeling better with taking torsemide and his new BiPAP.   On follow up 06/08/23, he is currently in atrial flutter with RVR. S/p successful DCCV (360J x 1) on 05/01/23. He was seen by Cardiology on 05/08/23 and noted to be back in atrial flutter with RVR. Lopressor 25 mg BID was added to patient's medication regimen for improved rate control. No missed doses of Xarelto 20 mg daily. ***  Only Tikosyn ***  Today, he denies symptoms of palpitations, chest pain, shortness of breath, orthopnea, PND, lower extremity edema, syncope, snoring, daytime somnolence, bleeding, or neurologic sequela. The patient is tolerating medications without difficulties and is otherwise without complaint today.    Atrial Fibrillation Risk  Factors:  he does have symptoms or diagnosis of sleep apnea. he is compliant with CPAP therapy.  he has a BMI of There is no height or weight on file to calculate BMI.. There were no vitals filed for this visit.   Current Outpatient Medications  Medication Sig Dispense Refill   acetaminophen (TYLENOL) 500 MG tablet Take 1,500 mg by mouth every 8 (eight) hours as needed for moderate pain (pain score 4-6).     diltiazem (CARDIZEM CD) 360 MG 24 hr capsule Take 1 capsule (360 mg total) by mouth daily. 30 capsule 3   Melatonin 10 MG TABS Take 10 mg by mouth at bedtime.     methylcellulose oral powder Take by mouth daily. 30 ml in coffee or water     metoprolol tartrate (LOPRESSOR) 25 MG tablet Take 1 tablet (25 mg total) by mouth 2 (two) times daily. 180 tablet 3   potassium chloride SA (KLOR-CON M) 20 MEQ tablet Take 1 tablet (20 mEq total) by mouth 2 (two) times daily. 30 tablet 0   rivaroxaban (XARELTO) 20 MG TABS tablet Take 1 tablet (20 mg total) by mouth daily with supper. 30 tablet 4   torsemide (DEMADEX) 20 MG tablet Take 2 tablets (40 mg total) by mouth daily. 60 tablet 0   No current facility-administered medications for this visit.    Atrial Fibrillation Management history:  Previous antiarrhythmic drugs: none Previous cardioversions: 05/01/23 Previous ablations: none Anticoagulation history: Eliquis, Xarelto   ROS- All systems are reviewed and negative except as per the HPI above.  Physical Exam: There were no vitals taken for this visit.  GEN- The patient is  well appearing, alert and oriented x 3 today.   Neck - no JVD or carotid bruit noted Lungs- Clear to ausculation bilaterally, normal work of breathing Heart- ***Regular rate and rhythm, no murmurs, rubs or gallops, PMI not laterally displaced Extremities- no clubbing, cyanosis, or edema Skin - no rash or ecchymosis noted   EKG today demonstrates  ***  Echo 03/26/23 demonstrated  1. Limited echo for function  with echo contrast. Even with contrast,  acoustic windows are very limited. Apical windows show normal LVEF without  apparent wall motion abnormalities. Left ventricular ejection fraction, by  estimation, is 60 to 65%. The left   ventricle has normal function. The left ventricle has no regional wall  motion abnormalities.   Echo 03/05/23:  1. Cannot assess LVF or wall motion due to inability to visualize  endocardial segments from poor acoustical windows. Left ventricular  endocardial border not optimally defined to evaluate regional wall motion.  Left ventricular diastolic function could  not be evaluated.   2. Right ventricular systolic function was not well visualized. The right  ventricular size is not well visualized. There is normal pulmonary artery  systolic pressure. The estimated right ventricular systolic pressure is  26.2 mmHg.   3. The mitral valve is normal in structure. No evidence of mitral valve  regurgitation. No evidence of mitral stenosis.   4. The aortic valve was not well visualized. Aortic valve regurgitation  is not visualized.   5. The inferior vena cava is normal in size with greater than 50%  respiratory variability, suggesting right atrial pressure of 3 mmHg.     ASSESSMENT & PLAN CHA2DS2-VASc Score = 2  The patient's score is based upon: CHF History: 1 HTN History: 1 Diabetes History: 0 Stroke History: 0 Vascular Disease History: 0 Age Score: 0 Gender Score: 0       ASSESSMENT AND PLAN: Persistent Atrial Flutter The patient's CHA2DS2-VASc score is 2, indicating a 2.2% annual risk of stroke.   S/p successful DCCV on 05/01/23.  He is currently in atrial flutter with RVR. He has had ERAF. We had a long discussion about medication treatments for rhythm control. Patient has a prolonged PR interval and history of recent acute on chronic HFpEF with atrial flutter with RVR in December. Due to all this, I would not prescribe Multaq or flecainide at this  time. His main and current option for rhythm control would be Tikosyn. He is not a candidate for amiodarone due to age. We talked about the monitoring required for Tikosyn and the hospital admission for Tikosyn, and potential adverse effects. After discussion, patient will price check medication and contact clinic with decision.  CrCl unable to be calculated at current weight; with creatinine of 0.9 from I-STAT on 1/17 and using the software's weight limit of 281 kg, I am able to calculate a CrCl estimate of 425 mL/min.  Qtc in NSR 438 ms.  Secondary Hypercoagulable State (ICD10:  D68.69) The patient is at significant risk for stroke/thromboembolism based upon his CHA2DS2-VASc Score of 2.  Start Rivaroxaban (Xarelto).  Continue Xarelto 20 mg daily without interruption.    Follow up ***.    Lake Bells, PA-C  Afib Clinic [facility] [address] [place], [place] [address] [phone]

## 2023-06-09 ENCOUNTER — Telehealth: Payer: Self-pay

## 2023-06-09 ENCOUNTER — Other Ambulatory Visit (HOSPITAL_COMMUNITY): Payer: Self-pay | Admitting: *Deleted

## 2023-06-09 NOTE — Telephone Encounter (Signed)
 VOB submitted for Orthovisc, right knee

## 2023-06-12 ENCOUNTER — Other Ambulatory Visit: Payer: Self-pay

## 2023-06-12 DIAGNOSIS — M1711 Unilateral primary osteoarthritis, right knee: Secondary | ICD-10-CM

## 2023-06-15 ENCOUNTER — Ambulatory Visit: Payer: No Typology Code available for payment source | Admitting: Orthopaedic Surgery

## 2023-06-22 ENCOUNTER — Ambulatory Visit: Payer: No Typology Code available for payment source

## 2023-06-22 ENCOUNTER — Other Ambulatory Visit (HOSPITAL_COMMUNITY): Payer: Self-pay

## 2023-06-22 MED ORDER — RIVAROXABAN 20 MG PO TABS: 20.0000 mg | ORAL_TABLET | Freq: Every day | ORAL | 1 refills | Status: DC

## 2023-06-22 NOTE — Progress Notes (Deleted)
 Office Visit    Patient Name: Willie Steele Date of Encounter: 06/22/2023  Primary Care Provider:  Tracey Harries, MD Primary Cardiologist:  Little Ishikawa, MD  Chief Complaint    Weight management  Significant Past Medical History   preDM 12/24 A1c 6  AF 1/25 DCCV not successful, f/u with AF Clinic; CHADS2-VASc = 2  CHF EF 60-65%, euvolemic at last appt, on torsemide  OSA On CPAP       Allergies  Allergen Reactions   Sulfa Antibiotics Hives and Rash    History of Present Illness    Willie Steele is a 43 y.o. male patient of Dr Bjorn Pippin, in the office today to discuss options for weight management.    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   Social History:   Tobacco:  Alcohol:  Caffeine:   Adherence Assessment  Do you ever forget to take your medication? [] Yes [] No  Do you ever skip doses due to side effects? [] Yes [] No  Do you have trouble affording your medicines? [] Yes [] No  Are you ever unable to pick up your medication due to transportation difficulties? [] Yes [] No  Do you ever stop taking your medications because you don't believe they are helping? [] Yes [] No  Do you check your weight daily? [] Yes [] No   Adherence strategy: ***  Barriers to obtaining medications: ***   Accessory Clinical Findings    Lab Results  Component Value Date   CREATININE 0.90 05/01/2023   BUN 17 05/01/2023   NA 141 05/01/2023   K 4.0 05/01/2023    CL 100 05/01/2023   CO2 29 03/28/2023   Lab Results  Component Value Date   ALT 33 03/26/2023   AST 17 03/26/2023   ALKPHOS 63 03/26/2023   BILITOT 0.7 03/26/2023   Lab Results  Component Value Date   HGBA1C 6.0 (H) 03/25/2023      Home Medications/Allergies    Current Outpatient Medications  Medication Sig Dispense Refill   acetaminophen (TYLENOL) 500 MG tablet Take 1,500 mg by mouth every 8 (eight) hours as needed for moderate pain (pain score 4-6).     diltiazem (CARDIZEM CD) 360 MG 24 hr capsule Take 1 capsule (360 mg total) by mouth daily. 30 capsule 3   Melatonin 10 MG TABS Take 10 mg by mouth at bedtime.     methylcellulose oral powder Take by mouth daily. 30 ml in coffee or water     metoprolol tartrate (LOPRESSOR) 25 MG tablet Take 1 tablet (25 mg total) by mouth 2 (two) times daily. 180 tablet 3   potassium chloride SA (KLOR-CON M) 20 MEQ tablet Take 1 tablet (20 mEq total) by mouth 2 (two) times daily. 30 tablet 0   rivaroxaban (XARELTO) 20 MG TABS tablet Take 1 tablet (20 mg total) by mouth daily with supper. 30 tablet 4   torsemide (DEMADEX) 20 MG tablet Take 2 tablets (40 mg total) by mouth daily. 60 tablet 0   No current facility-administered  medications for this visit.     Allergies  Allergen Reactions   Sulfa Antibiotics Hives and Rash    Assessment & Plan    No problem-specific Assessment & Plan notes found for this encounter.   Phillips Hay PharmD CPP [facility]  [address] [place], [place] [address] [phone]

## 2023-06-25 ENCOUNTER — Ambulatory Visit: Admitting: Physician Assistant

## 2023-06-29 ENCOUNTER — Ambulatory Visit: Admitting: Orthopaedic Surgery

## 2023-07-02 ENCOUNTER — Ambulatory Visit: Admitting: Orthopaedic Surgery

## 2023-07-07 ENCOUNTER — Encounter: Payer: Self-pay | Admitting: Cardiology

## 2023-07-08 ENCOUNTER — Ambulatory Visit: Admitting: Physician Assistant

## 2023-07-08 ENCOUNTER — Ambulatory Visit: Admitting: Orthopaedic Surgery

## 2023-07-09 ENCOUNTER — Emergency Department (HOSPITAL_COMMUNITY)

## 2023-07-09 ENCOUNTER — Encounter (HOSPITAL_COMMUNITY): Payer: Self-pay | Admitting: Internal Medicine

## 2023-07-09 ENCOUNTER — Other Ambulatory Visit: Payer: Self-pay

## 2023-07-09 ENCOUNTER — Inpatient Hospital Stay (HOSPITAL_COMMUNITY)
Admission: EM | Admit: 2023-07-09 | Discharge: 2023-07-13 | DRG: 603 | Disposition: A | Discharge status: Home Health | Attending: Internal Medicine | Admitting: Internal Medicine

## 2023-07-09 DIAGNOSIS — Z6841 Body Mass Index (BMI) 40.0 and over, adult: Secondary | ICD-10-CM

## 2023-07-09 DIAGNOSIS — I890 Lymphedema, not elsewhere classified: Secondary | ICD-10-CM | POA: Diagnosis present

## 2023-07-09 DIAGNOSIS — Z87891 Personal history of nicotine dependence: Secondary | ICD-10-CM

## 2023-07-09 DIAGNOSIS — I5032 Chronic diastolic (congestive) heart failure: Secondary | ICD-10-CM | POA: Diagnosis present

## 2023-07-09 DIAGNOSIS — Z79899 Other long term (current) drug therapy: Secondary | ICD-10-CM | POA: Diagnosis not present

## 2023-07-09 DIAGNOSIS — I483 Typical atrial flutter: Secondary | ICD-10-CM | POA: Diagnosis not present

## 2023-07-09 DIAGNOSIS — Z7901 Long term (current) use of anticoagulants: Secondary | ICD-10-CM

## 2023-07-09 DIAGNOSIS — G4733 Obstructive sleep apnea (adult) (pediatric): Secondary | ICD-10-CM | POA: Diagnosis present

## 2023-07-09 DIAGNOSIS — I4892 Unspecified atrial flutter: Secondary | ICD-10-CM | POA: Diagnosis present

## 2023-07-09 DIAGNOSIS — I110 Hypertensive heart disease with heart failure: Secondary | ICD-10-CM | POA: Diagnosis present

## 2023-07-09 DIAGNOSIS — I4891 Unspecified atrial fibrillation: Secondary | ICD-10-CM | POA: Diagnosis present

## 2023-07-09 DIAGNOSIS — Z882 Allergy status to sulfonamides status: Secondary | ICD-10-CM

## 2023-07-09 DIAGNOSIS — E669 Obesity, unspecified: Secondary | ICD-10-CM | POA: Diagnosis present

## 2023-07-09 DIAGNOSIS — L03115 Cellulitis of right lower limb: Secondary | ICD-10-CM | POA: Diagnosis present

## 2023-07-09 DIAGNOSIS — E873 Alkalosis: Secondary | ICD-10-CM | POA: Diagnosis present

## 2023-07-09 DIAGNOSIS — L03119 Cellulitis of unspecified part of limb: Secondary | ICD-10-CM | POA: Diagnosis present

## 2023-07-09 LAB — CBC WITH DIFFERENTIAL/PLATELET
Abs Immature Granulocytes: 0.12 10*3/uL — ABNORMAL HIGH (ref 0.00–0.07)
Basophils Absolute: 0 10*3/uL (ref 0.0–0.1)
Basophils Relative: 0 %
Eosinophils Absolute: 0.1 10*3/uL (ref 0.0–0.5)
Eosinophils Relative: 1 %
HCT: 46.8 % (ref 39.0–52.0)
Hemoglobin: 14 g/dL (ref 13.0–17.0)
Immature Granulocytes: 1 %
Lymphocytes Relative: 12 %
Lymphs Abs: 1.6 10*3/uL (ref 0.7–4.0)
MCH: 30.6 pg (ref 26.0–34.0)
MCHC: 29.9 g/dL — ABNORMAL LOW (ref 30.0–36.0)
MCV: 102.4 fL — ABNORMAL HIGH (ref 80.0–100.0)
Monocytes Absolute: 0.9 10*3/uL (ref 0.1–1.0)
Monocytes Relative: 7 %
Neutro Abs: 10.5 10*3/uL — ABNORMAL HIGH (ref 1.7–7.7)
Neutrophils Relative %: 79 %
Platelets: 298 10*3/uL (ref 150–400)
RBC: 4.57 MIL/uL (ref 4.22–5.81)
RDW: 16.2 % — ABNORMAL HIGH (ref 11.5–15.5)
WBC: 13.2 10*3/uL — ABNORMAL HIGH (ref 4.0–10.5)
nRBC: 0 % (ref 0.0–0.2)

## 2023-07-09 LAB — COMPREHENSIVE METABOLIC PANEL WITH GFR
ALT: 63 U/L — ABNORMAL HIGH (ref 0–44)
AST: 33 U/L (ref 15–41)
Albumin: 3.4 g/dL — ABNORMAL LOW (ref 3.5–5.0)
Alkaline Phosphatase: 53 U/L (ref 38–126)
Anion gap: 10 (ref 5–15)
BUN: 24 mg/dL — ABNORMAL HIGH (ref 6–20)
CO2: 31 mmol/L (ref 22–32)
Calcium: 8.8 mg/dL — ABNORMAL LOW (ref 8.9–10.3)
Chloride: 98 mmol/L (ref 98–111)
Creatinine, Ser: 0.7 mg/dL (ref 0.61–1.24)
GFR, Estimated: >60 mL/min (ref >60)
Glucose, Bld: 107 mg/dL — ABNORMAL HIGH (ref 70–99)
Potassium: 3.6 mmol/L (ref 3.5–5.1)
Sodium: 139 mmol/L (ref 135–145)
Total Bilirubin: 1 mg/dL (ref 0.0–1.2)
Total Protein: 7.3 g/dL (ref 6.5–8.1)

## 2023-07-09 LAB — BRAIN NATRIURETIC PEPTIDE: B Natriuretic Peptide: 108.1 pg/mL — ABNORMAL HIGH (ref 0.0–100.0)

## 2023-07-09 MED ORDER — ONDANSETRON HCL 4 MG PO TABS
4.0000 mg | ORAL_TABLET | Freq: Four times a day (QID) | ORAL | Status: DC | PRN
Start: 2023-07-09 — End: 2023-07-13

## 2023-07-09 MED ORDER — TRAZODONE HCL 50 MG PO TABS
25.0000 mg | ORAL_TABLET | Freq: Every evening | ORAL | Status: DC | PRN
  Administered 2023-07-09: 25 mg via ORAL
  Filled 2023-07-09: qty 1

## 2023-07-09 MED ORDER — FUROSEMIDE 10 MG/ML IJ SOLN
80.0000 mg | Freq: Two times a day (BID) | INTRAMUSCULAR | Status: DC
  Administered 2023-07-10 – 2023-07-12 (×5): 80 mg via INTRAVENOUS
  Filled 2023-07-09 (×6): qty 8

## 2023-07-09 MED ORDER — POTASSIUM CHLORIDE CRYS ER 20 MEQ PO TBCR
20.0000 meq | EXTENDED_RELEASE_TABLET | Freq: Two times a day (BID) | ORAL | Status: DC
  Administered 2023-07-09 – 2023-07-10 (×2): 20 meq via ORAL
  Filled 2023-07-09 (×2): qty 1

## 2023-07-09 MED ORDER — METOPROLOL TARTRATE 25 MG PO TABS
25.0000 mg | ORAL_TABLET | Freq: Two times a day (BID) | ORAL | Status: DC
  Administered 2023-07-09 – 2023-07-13 (×8): 25 mg via ORAL
  Filled 2023-07-09 (×8): qty 1

## 2023-07-09 MED ORDER — RIVAROXABAN 20 MG PO TABS
20.0000 mg | ORAL_TABLET | Freq: Every day | ORAL | Status: DC
  Administered 2023-07-09 – 2023-07-12 (×4): 20 mg via ORAL
  Filled 2023-07-09 (×4): qty 1

## 2023-07-09 MED ORDER — LINEZOLID 600 MG/300ML IV SOLN
600.0000 mg | Freq: Two times a day (BID) | INTRAVENOUS | Status: DC
  Administered 2023-07-09 – 2023-07-10 (×3): 600 mg via INTRAVENOUS
  Filled 2023-07-09 (×3): qty 300

## 2023-07-09 MED ORDER — OXYCODONE HCL 5 MG PO TABS
5.0000 mg | ORAL_TABLET | Freq: Four times a day (QID) | ORAL | Status: DC | PRN
  Administered 2023-07-09 – 2023-07-10 (×3): 5 mg via ORAL
  Filled 2023-07-09 (×3): qty 1

## 2023-07-09 MED ORDER — ALBUTEROL SULFATE (2.5 MG/3ML) 0.083% IN NEBU: 2.5000 mg | INHALATION_SOLUTION | RESPIRATORY_TRACT | Status: DC | PRN

## 2023-07-09 MED ORDER — ONDANSETRON HCL 4 MG/2ML IJ SOLN: 4.0000 mg | Freq: Four times a day (QID) | INTRAMUSCULAR | Status: DC | PRN

## 2023-07-09 MED ORDER — MORPHINE SULFATE (PF) 2 MG/ML IV SOLN
2.0000 mg | Freq: Once | INTRAVENOUS | Status: AC
  Administered 2023-07-09: 2 mg via INTRAVENOUS
  Filled 2023-07-09: qty 1

## 2023-07-09 MED ORDER — ACETAMINOPHEN 650 MG RE SUPP: 650.0000 mg | Freq: Four times a day (QID) | RECTAL | Status: DC | PRN

## 2023-07-09 MED ORDER — DILTIAZEM HCL ER COATED BEADS 240 MG PO CP24
360.0000 mg | ORAL_CAPSULE | Freq: Every day | ORAL | Status: DC
  Administered 2023-07-10 – 2023-07-13 (×4): 360 mg via ORAL
  Filled 2023-07-09 (×3): qty 1
  Filled 2023-07-09: qty 3

## 2023-07-09 MED ORDER — FUROSEMIDE 10 MG/ML IJ SOLN: 40.0000 mg | Freq: Once | INTRAMUSCULAR | Status: DC

## 2023-07-09 MED ORDER — ACETAMINOPHEN 325 MG PO TABS
650.0000 mg | ORAL_TABLET | Freq: Four times a day (QID) | ORAL | Status: DC | PRN
  Administered 2023-07-10 (×4): 650 mg via ORAL
  Filled 2023-07-09 (×4): qty 2

## 2023-07-09 NOTE — ED Triage Notes (Signed)
 BIB EMS from home with R leg pain since Sunday, 10/10 pain hx cellulitis, Afib Numb right foot Open wound on right shin, no bleedings 150/82 94 ra 80 hr 20 rr  Allergies: sulfa

## 2023-07-09 NOTE — Progress Notes (Signed)
   07/09/23 1950  BiPAP/CPAP/SIPAP  $ Non-Invasive Home Ventilator  Initial  $ Face Mask XL Yes  BiPAP/CPAP/SIPAP Pt Type Adult  BiPAP/CPAP/SIPAP DREAMSTATIOND  Mask Type Full face mask  Dentures removed? Not applicable  Mask Size Extra large  Flow Rate 2 lpm  Patient Home Machine No  Patient Home Mask No  Patient Home Tubing No  Auto Titrate Yes (Imax 25 Emin 10 PS 8)  CPAP/SIPAP surface wiped down Yes  BiPAP/CPAP /SiPAP Vitals  Pulse Rate (!) 102  Resp 20  SpO2 99 %  Bilateral Breath Sounds Diminished  MEWS Score/Color  MEWS Score 1  MEWS Score Color Green

## 2023-07-09 NOTE — H&P (Signed)
 History and Physical  Willie Steele XBM:[id number] DOB: 1980/10/11 DOA: 07/09/2023  PCP: Tracey Harries, MD   Chief Complaint: Right leg pain, with wound  HPI: Willie Steele is a 43 y.o. male with medical history significant for chronic diastolic congestive heart failure, hypertension, chronic atrial flutter, super morbid obesity being admitted to the hospital with recurrent right lower extremity cellulitis.  He gets cellulitis about once per year, he was admitted to this facility in November 2024, treated with Zyvox successfully.  Since that time he was admitted with atrial flutter.  He has been doing well, about 5 days ago he noticed redness and pain in his right lower extremity.  No open wound or injury as far as he can recall.  Started p.o. doxycycline ordered by urgent care.  Initially seem to help, but today he had increased swelling, redness and pain.  Denies any fevers, chills, cough, shortness of breath or other complication.  Review of Systems: Please see HPI for pertinent positives and negatives. A complete 10 system review of systems are otherwise negative.  Past Medical History:  Diagnosis Date   Abscess of right thigh    Chronic diastolic (congestive) heart failure (HCC)    Hypertension    Morbid obesity (HCC)    Past Surgical History:  Procedure Laterality Date   CARDIOVERSION N/A 05/01/2023   Procedure: CARDIOVERSION;  Surgeon: Pricilla Riffle, MD;  Location: [facility] INVASIVE CV LAB;  Service: Cardiovascular;  Laterality: N/A;   Social History:  reports that he quit smoking about 12 years ago. His smoking use included cigarettes. He has never used smokeless tobacco. He reports current alcohol use. He reports that he does not use drugs.  Allergies  Allergen Reactions   Sulfa Antibiotics Hives and Rash    Family History  Problem Relation Age of Onset   Bowel Disease Father        colon blockage     Prior to Admission medications   Medication Sig Start Date End Date Taking?  Authorizing Provider  acetaminophen (TYLENOL) 500 MG tablet Take 1,500 mg by mouth every 8 (eight) hours as needed for moderate pain (pain score 4-6).    [provider]  diltiazem (CARDIZEM CD) 360 MG 24 hr capsule Take 1 capsule (360 mg total) by mouth daily. 04/09/23   Eustace Pen, PA-C  Melatonin 10 MG TABS Take 10 mg by mouth at bedtime.    [provider]  methylcellulose oral powder Take by mouth daily. 30 ml in coffee or water    [provider]  metoprolol tartrate (LOPRESSOR) 25 MG tablet Take 1 tablet (25 mg total) by mouth 2 (two) times daily. 05/08/23 08/06/23  Denyce Robert, NP  potassium chloride SA (KLOR-CON M) 20 MEQ tablet Take 1 tablet (20 mEq total) by mouth 2 (two) times daily. 03/28/23   Rolly Salter, MD  rivaroxaban (XARELTO) 20 MG TABS tablet Take 1 tablet (20 mg total) by mouth daily with supper. Appointment Required For Further Refills [phone] 06/22/23   Eustace Pen, PA-C  torsemide (DEMADEX) 20 MG tablet Take 2 tablets (40 mg total) by mouth daily. 03/28/23   Rolly Salter, MD    Physical Exam: BP (!) 171/98   Pulse 83   Temp 98.4 F (36.9 C)   Resp (!) 22   Wt (!) 329.9 kg   SpO2 100%   BMI 90.91 kg/m  General:  Alert, oriented, calm, in no acute distress, speaking in full sentences on room air.  Morbidly obese. Eyes: EOMI, clear conjuctivae, white sclerea Neck: supple, no masses, trachea mildline  Cardiovascular: RRR, no murmurs or rubs, no peripheral edema  Respiratory: clear to auscultation bilaterally, no wheezes, no crackles  Abdomen: soft, nontender, nondistended, normal bowel tones heard  Skin: dry, no rashes, right lower extremity with obvious cellulitis, no open lesions or areas of fluctuance.  Somewhat tender to palpation. Musculoskeletal: no joint effusions, normal range of motion  Psychiatric: appropriate affect, normal speech  Neurologic: extraocular muscles intact, clear speech, moving all  extremities with intact sensorium          Labs on Admission:  Basic Metabolic Panel: Recent Labs  Lab 07/09/23 1451  NA 139  K 3.6  CL 98  CO2 31  GLUCOSE 107*  BUN 24*  CREATININE 0.70  CALCIUM 8.8*   Liver Function Tests: Recent Labs  Lab 07/09/23 1451  AST 33  ALT 63*  ALKPHOS 53  BILITOT 1.0  PROT 7.3  ALBUMIN 3.4*   No results for input(s): "LIPASE", "AMYLASE" in the last 168 hours. No results for input(s): "AMMONIA" in the last 168 hours. CBC: Recent Labs  Lab 07/09/23 1451  WBC 13.2*  NEUTROABS 10.5*  HGB 14.0  HCT 46.8  MCV 102.4*  PLT 298   Cardiac Enzymes: No results for input(s): "CKTOTAL", "CKMB", "CKMBINDEX", "TROPONINI" in the last 168 hours. BNP (last 3 results) Recent Labs    03/04/23 1135 03/25/23 0907 07/09/23 1451  BNP 95.7 73.0 108.1*    ProBNP (last 3 results) No results for input(s): "PROBNP" in the last 8760 hours.  CBG: No results for input(s): "GLUCAP" in the last 168 hours.  Radiological Exams on Admission: DG Tibia/Fibula Right Port Result Date: 07/09/2023 CLINICAL DATA:  Right leg pain for several days. EXAM: PORTABLE RIGHT TIBIA AND FIBULA - 2 VIEW COMPARISON:  None Available. FINDINGS: There is no evidence of fracture or other focal bone lesions. Soft tissues are unremarkable. IMPRESSION: Negative. Electronically Signed   By: Lupita Raider M.D.   On: 07/09/2023 16:01   [place] RT LOWER EXTREM LTD SOFT TISSUE NON VASCULAR Result Date: 07/09/2023 CLINICAL DATA:  Concern for abscess in the right lower extremity EXAM: ULTRASOUND RIGHT LOWER EXTREMITY LIMITED TECHNIQUE: Ultrasound examination of the lower extremity soft tissues was performed in the area of clinical concern. COMPARISON:  None Available. FINDINGS: Ultrasound performed in the right anterior calf in the area concern. Soft tissue/subcutaneous edema noted in the area concern. No focal fluid collection to suggest abscess. IMPRESSION: Edema noted in the area concern.  No  focal fluid collection/abscess. Electronically Signed   By: Charlett Nose M.D.   On: 07/09/2023 14:07   Assessment/Plan Willie Steele is a 43 y.o. male with medical history significant for chronic diastolic congestive heart failure, hypertension, chronic atrial flutter, super morbid obesity being admitted to the hospital with recurrent right lower extremity cellulitis.   Right lower extremity cellulitis-without evidence of sepsis, his biggest risk factor is probably his body habitus.  Emergent as above is limited due to body habitus, but shows no evidence of complication. -Inpatient admission -Empiric IV Zyvox -Not meeting sepsis criteria, blood cultures were not obtained  Chronic atrial flutter-continue Cardizem, metoprolol, Xarelto  Diastolic congestive heart failure-no obvious evidence of acute failure exacerbation, on previous admission patient was hypoxic and obviously very fluid overloaded.  However I do suspect he is somewhat his dry weight as well as weights in the hospital have been historically very difficult to determine. -Continue metoprolol -Diuresis with  Lasix 80 mg twice daily for now  Super morbid obesity-90.91, complicating hospice of care  DVT prophylaxis: Xarelto    Code Status: Full Code  Consults called: None  Admission status: The appropriate patient status for this patient is INPATIENT. Inpatient status is judged to be reasonable and necessary in order to provide the required intensity of service to ensure the patient's safety. The patient's presenting symptoms, physical exam findings, and initial radiographic and laboratory data in the context of their chronic comorbidities is felt to place them at high risk for further clinical deterioration. Furthermore, it is not anticipated that the patient will be medically stable for discharge from the hospital within 2 midnights of admission.    I certify that at the point of admission it is my clinical judgment that the  patient will require inpatient hospital care spanning beyond 2 midnights from the point of admission due to high intensity of service, high risk for further deterioration and high frequency of surveillance required  Time spent: 55 minutes  Juliane Guest Sharlette Dense MD [facility] Pager [phone]  If 7PM-7AM, please contact night-coverage [email or web address] Password [facility]  07/09/2023, 4:25 PM

## 2023-07-09 NOTE — ED Provider Notes (Signed)
 [place] EMERGENCY DEPARTMENT AT [facility] Provider Note   CSN: [id number] Arrival date & time: 07/09/23  1301     History  Chief Complaint  Patient presents with   Leg Pain    Right leg    Willie Steele is a 43 y.o. male.   Leg Pain Patient presents with right leg pain and redness.  History of cellulitis and abscess.  History of A-fib.  History of congestive heart failure.  States around Sunday began to get more redness.  Has pictures on his phone that I have reviewed.  Started on doxycycline through PCP with a virtual visit.  Had been doing better but now increasing pain and increasing redness.  No fevers.  Has been on doxycycline for this episode was had previous episodes requiring admission.  States he also feels if he is carrying extra fluid.   Past Medical History:  Diagnosis Date   Abscess of right thigh    Chronic diastolic (congestive) heart failure (HCC)    Hypertension    Morbid obesity (HCC)     Home Medications Prior to Admission medications   Medication Sig Start Date End Date Taking? Authorizing Provider  acetaminophen (TYLENOL) 500 MG tablet Take 1,500 mg by mouth every 8 (eight) hours as needed for moderate pain (pain score 4-6).    [provider]  diltiazem (CARDIZEM CD) 360 MG 24 hr capsule Take 1 capsule (360 mg total) by mouth daily. 04/09/23   Eustace Pen, PA-C  Melatonin 10 MG TABS Take 10 mg by mouth at bedtime.    [provider]  methylcellulose oral powder Take by mouth daily. 30 ml in coffee or water    [provider]  metoprolol tartrate (LOPRESSOR) 25 MG tablet Take 1 tablet (25 mg total) by mouth 2 (two) times daily. 05/08/23 08/06/23  Denyce Robert, NP  potassium chloride SA (KLOR-CON M) 20 MEQ tablet Take 1 tablet (20 mEq total) by mouth 2 (two) times daily. 03/28/23   Rolly Salter, MD  rivaroxaban (XARELTO) 20 MG TABS tablet Take 1 tablet (20 mg total) by mouth daily with supper.  Appointment Required For Further Refills [phone] 06/22/23   Eustace Pen, PA-C  torsemide (DEMADEX) 20 MG tablet Take 2 tablets (40 mg total) by mouth daily. 03/28/23   Rolly Salter, MD      Allergies    Sulfa antibiotics    Review of Systems   Review of Systems  Physical Exam Updated Vital Signs BP (!) 171/98   Pulse 83   Temp 98.4 F (36.9 C)   Resp (!) 22   Wt (!) 329.9 kg   SpO2 100%   BMI 90.91 kg/m  Physical Exam Vitals and nursing note reviewed.  Constitutional:      Appearance: He is obese.  Skin:    Comments: Chronic venous changes bilateral lower extremities and abdomen.  Has edema and erythema on right medial thigh and right lower leg.  No fluctuance.          ED Results / Procedures / Treatments   Labs (all labs ordered are listed, but only abnormal results are displayed) Labs Reviewed  COMPREHENSIVE METABOLIC PANEL WITH GFR  BRAIN NATRIURETIC PEPTIDE  CBC WITH DIFFERENTIAL/PLATELET    EKG None  Radiology [place] RT LOWER EXTREM LTD SOFT TISSUE NON VASCULAR Result Date: 07/09/2023 CLINICAL DATA:  Concern for abscess in the right lower extremity EXAM: ULTRASOUND RIGHT LOWER EXTREMITY LIMITED TECHNIQUE: Ultrasound examination of the  lower extremity soft tissues was performed in the area of clinical concern. COMPARISON:  None Available. FINDINGS: Ultrasound performed in the right anterior calf in the area concern. Soft tissue/subcutaneous edema noted in the area concern. No focal fluid collection to suggest abscess. IMPRESSION: Edema noted in the area concern.  No focal fluid collection/abscess. Electronically Signed   By: Charlett Nose M.D.   On: 07/09/2023 14:07    Procedures Procedures    Medications Ordered in ED Medications - No data to display  ED Course/ Medical Decision Making/ A&P                                 Medical Decision Making Amount and/or Complexity of Data Reviewed Labs: ordered. Radiology: ordered.   Patient with  likely cellulitis right lower leg.  Differential diagnosis does include abscess and other chronic conditions changes.  Does not appear septic at this time.  However has been on antibiotics.  Also history of CHF.  States he does think he is carrying extra fluid.  Will get x-ray.  Will get ultrasound to help evaluate if there are abscess.  Patient is too large for CT scan.  I think having worsening pain and erythema patient would benefit from admission to the hospital.  Reviewed previous discharge note.  Care will be turned over to oncoming provider for likely admission        Final Clinical Impression(s) / ED Diagnoses Final diagnoses:  Cellulitis of right lower extremity    Rx / DC Orders ED Discharge Orders     None         Benjiman Core, MD 07/09/23 1444

## 2023-07-09 NOTE — ED Provider Notes (Signed)
 Pt signed out by Dr. Rubin Payor pending labs/xr.  CBC with wbc elevated at 13.2, CMP nl, bnp nl.  Xrays reviewed by me.  I agree with the radiologist.  R tib/fib: Negative.   [place]: Ultrasound performed in the right anterior calf in the area concern.  Soft tissue/subcutaneous edema noted in the area concern. No focal  fluid collection to suggest abscess.    IMPRESSION:  Edema noted in the area concern.  No focal fluid collection/abscess.    Pt given IV Zyvox and IV lasix.  He requested bipap at bedside as he has severe sleep apnea.    Pt d/w Dr. Kirby Crigler ([facility]) for admission.   Jacalyn Lefevre, MD 07/09/23 [phone]

## 2023-07-10 DIAGNOSIS — I483 Typical atrial flutter: Secondary | ICD-10-CM | POA: Diagnosis not present

## 2023-07-10 DIAGNOSIS — Z7901 Long term (current) use of anticoagulants: Secondary | ICD-10-CM | POA: Diagnosis not present

## 2023-07-10 DIAGNOSIS — G4733 Obstructive sleep apnea (adult) (pediatric): Secondary | ICD-10-CM

## 2023-07-10 DIAGNOSIS — L03115 Cellulitis of right lower limb: Secondary | ICD-10-CM | POA: Diagnosis not present

## 2023-07-10 DIAGNOSIS — E669 Obesity, unspecified: Secondary | ICD-10-CM

## 2023-07-10 LAB — BASIC METABOLIC PANEL WITH GFR
Anion gap: 8 (ref 5–15)
BUN: 22 mg/dL — ABNORMAL HIGH (ref 6–20)
CO2: 29 mmol/L (ref 22–32)
Calcium: 8.2 mg/dL — ABNORMAL LOW (ref 8.9–10.3)
Chloride: 99 mmol/L (ref 98–111)
Creatinine, Ser: 0.69 mg/dL (ref 0.61–1.24)
GFR, Estimated: >60 mL/min (ref >60)
Glucose, Bld: 112 mg/dL — ABNORMAL HIGH (ref 70–99)
Potassium: 3.9 mmol/L (ref 3.5–5.1)
Sodium: 136 mmol/L (ref 135–145)

## 2023-07-10 LAB — CBC
HCT: 38.1 % — ABNORMAL LOW (ref 39.0–52.0)
Hemoglobin: 12.1 g/dL — ABNORMAL LOW (ref 13.0–17.0)
MCH: 31.3 pg (ref 26.0–34.0)
MCHC: 31.8 g/dL (ref 30.0–36.0)
MCV: 98.4 fL (ref 80.0–100.0)
Platelets: 220 10*3/uL (ref 150–400)
RBC: 3.87 MIL/uL — ABNORMAL LOW (ref 4.22–5.81)
RDW: 16.2 % — ABNORMAL HIGH (ref 11.5–15.5)
WBC: 9.7 10*3/uL (ref 4.0–10.5)
nRBC: 0 % (ref 0.0–0.2)

## 2023-07-10 MED ORDER — MELATONIN 5 MG PO TABS
10.0000 mg | ORAL_TABLET | Freq: Every day | ORAL | Status: DC
  Administered 2023-07-10 – 2023-07-12 (×3): 10 mg via ORAL
  Filled 2023-07-10 (×3): qty 2

## 2023-07-10 MED ORDER — POTASSIUM CHLORIDE CRYS ER 20 MEQ PO TBCR
40.0000 meq | EXTENDED_RELEASE_TABLET | Freq: Two times a day (BID) | ORAL | Status: DC
  Administered 2023-07-10 – 2023-07-13 (×6): 40 meq via ORAL
  Filled 2023-07-10 (×6): qty 2

## 2023-07-10 MED ORDER — MELATONIN 5 MG PO TABS: 10.0000 mg | ORAL_TABLET | Freq: Every day | ORAL | Status: DC

## 2023-07-10 MED ORDER — MORPHINE SULFATE (PF) 4 MG/ML IV SOLN: 4.0000 mg | INTRAVENOUS | Status: DC | PRN

## 2023-07-10 MED ORDER — OXYCODONE HCL 5 MG PO TABS
10.0000 mg | ORAL_TABLET | ORAL | Status: DC | PRN
  Administered 2023-07-10 – 2023-07-12 (×7): 10 mg via ORAL
  Filled 2023-07-10 (×7): qty 2

## 2023-07-10 MED ORDER — MELATONIN 5 MG PO TABS
5.0000 mg | ORAL_TABLET | Freq: Once | ORAL | Status: AC
  Administered 2023-07-10: 5 mg via ORAL
  Filled 2023-07-10: qty 1

## 2023-07-10 MED ORDER — LINEZOLID 600 MG PO TABS
600.0000 mg | ORAL_TABLET | Freq: Two times a day (BID) | ORAL | Status: DC
  Administered 2023-07-10 – 2023-07-13 (×6): 600 mg via ORAL
  Filled 2023-07-10 (×6): qty 1

## 2023-07-10 NOTE — Assessment & Plan Note (Addendum)
 07-10-2023 continue bipap  07-11-2023 stable on bipap  07-12-2023 stable on bipap.  07-13-2023 stable

## 2023-07-10 NOTE — Progress Notes (Addendum)
 PROGRESS NOTE    Willie Steele  IEP:[id number] DOB: 06/19/1980 DOA: 07/09/2023 PCP: Tracey Harries, MD  Subjective: Pt seen and examined. Pt is ambulatory at home. C/o right leg pain.   Hospital Course: HPI: Willie Steele is a 43 y.o. male with medical history significant for chronic diastolic congestive heart failure, hypertension, chronic atrial flutter, super morbid obesity being admitted to the hospital with recurrent right lower extremity cellulitis.  He gets cellulitis about once per year, he was admitted to this facility in November 2024, treated with Zyvox successfully.  Since that time he was admitted with atrial flutter.  He has been doing well, about 5 days ago he noticed redness and pain in his right lower extremity.  No open wound or injury as far as he can recall.  Started p.o. doxycycline ordered by urgent care.  Initially seem to help, but today he had increased swelling, redness and pain.  Denies any fevers, chills, cough, shortness of breath or other complication.   Significant Events: Admitted 07/09/2023 for right leg cellulitis   Significant Labs: WBC 13.2, HgB 14, Plt 298 Na 139, K 3.6, CO2 31, BUN 24, Scr 0.7, glu 107 BNP 108  Significant Imaging Studies:   Antibiotic Therapy: Anti-infectives (From admission, onward)    Start     Dose/Rate Route Frequency Ordered Stop   07/09/23 1500  linezolid (ZYVOX) IVPB 600 mg        600 mg 300 mL/hr over 60 Minutes Intravenous Every 12 hours 07/09/23 1448         Procedures:   Consultants:     Assessment and Plan: * Cellulitis, leg 07-10-2023 continue with IV Zyvox. He had admission in 02-2023 where he responded to zyvox and was discharged home. Check ASO titer. Increase IV morphine prn to 4 mg q4h prn. Continue IV lasix for diuresis  Chronic anticoagulation - on Xarelto for atrial flutter 07-10-2023 Xarelto started by Afib clinic despite his massive obesity. Continue Xarelto.  Atrial flutter  (HCC) 07-10-2023 move to telemetry floor due to his hx of atrial flutter. Continue cardizem CD and xarelto.  OSA on CPAP 07-10-2023 continue cpap  Super obesity Body mass index is 87.49 kg/m.   Chronic diastolic heart failure (HCC) 07-10-2023 not exacerbated. IV lasix for his edema in his cellulitic leg, not for CHF exacerbation.  DVT prophylaxis:  rivaroxaban (XARELTO) tablet 20 mg     Code Status: Full Code Family Communication: no family at beside. He is decisional. Disposition Plan: return home Reason for continuing need for hospitalization: remains on IV Abx.  Objective: Vitals:   07/09/23 2151 07/10/23 0156 07/10/23 0542 07/10/23 0902  BP: 139/87 131/82 (!) 140/88 (!) 145/83  Pulse: 92 92 87 91  Resp: 20 19 18    Temp: 97.6 F (36.4 C) 97.6 F (36.4 C) 97.8 F (36.6 C)   TempSrc: Axillary Axillary Oral   SpO2: 99% 98% 97%   Weight:      Height:        Intake/Output Summary (Last 24 hours) at 07/10/2023 1023 Last data filed at 07/10/2023 6606 Gross per 24 hour  Intake 840.05 ml  Output 800 ml  Net 40.05 ml   Filed Weights   07/09/23 1303 07/09/23 1708  Weight: (!) 329.9 kg (!) 317.5 kg    Examination:  Physical Exam Vitals and nursing note reviewed.  Constitutional:      Appearance: He is obese.  HENT:     Head: Normocephalic and atraumatic.     Nose: Nose  normal.  Pulmonary:     Effort: No respiratory distress.  Abdominal:     General: Abdomen is protuberant.     Tenderness: There is no abdominal tenderness.  Skin:    Comments: Erythema of right lower leg, right medial inner thigh and tracking up to right side of his groin.    Data Reviewed: I have personally reviewed following labs and imaging studies  CBC: Recent Labs  Lab 07/09/23 1451 07/10/23 0356  WBC 13.2* 9.7  NEUTROABS 10.5*  --   HGB 14.0 12.1*  HCT 46.8 38.1*  MCV 102.4* 98.4  PLT 298 220   Basic Metabolic Panel: Recent Labs  Lab 07/09/23 1451 07/10/23 0356  NA 139  136  K 3.6 3.9  CL 98 99  CO2 31 29  GLUCOSE 107* 112*  BUN 24* 22*  CREATININE 0.70 0.69  CALCIUM 8.8* 8.2*   GFR: Estimated Creatinine Clearance: 302.3 mL/min (by C-G formula based on SCr of 0.69 mg/dL). Liver Function Tests: Recent Labs  Lab 07/09/23 1451  AST 33  ALT 63*  ALKPHOS 53  BILITOT 1.0  PROT 7.3  ALBUMIN 3.4*   BNP (last 3 results) Recent Labs    03/04/23 1135 03/25/23 0907 07/09/23 1451  BNP 95.7 73.0 108.1*   Radiology Studies: DG Tibia/Fibula Right Port Result Date: 07/09/2023 CLINICAL DATA:  Right leg pain for several days. EXAM: PORTABLE RIGHT TIBIA AND FIBULA - 2 VIEW COMPARISON:  None Available. FINDINGS: There is no evidence of fracture or other focal bone lesions. Soft tissues are unremarkable. IMPRESSION: Negative. Electronically Signed   By: Lupita Raider M.D.   On: 07/09/2023 16:01   [place] RT LOWER EXTREM LTD SOFT TISSUE NON VASCULAR Result Date: 07/09/2023 CLINICAL DATA:  Concern for abscess in the right lower extremity EXAM: ULTRASOUND RIGHT LOWER EXTREMITY LIMITED TECHNIQUE: Ultrasound examination of the lower extremity soft tissues was performed in the area of clinical concern. COMPARISON:  None Available. FINDINGS: Ultrasound performed in the right anterior calf in the area concern. Soft tissue/subcutaneous edema noted in the area concern. No focal fluid collection to suggest abscess. IMPRESSION: Edema noted in the area concern.  No focal fluid collection/abscess. Electronically Signed   By: Charlett Nose M.D.   On: 07/09/2023 14:07    Scheduled Meds:  diltiazem  360 mg Oral Daily   furosemide  80 mg Intravenous BID   [START ON 07/11/2023] melatonin  10 mg Oral QHS   metoprolol tartrate  25 mg Oral BID   potassium chloride SA  40 mEq Oral BID   rivaroxaban  20 mg Oral Q supper   Continuous Infusions:  linezolid (ZYVOX) IV 600 mg (07/10/23 0915)     LOS: 1 day   Time spent: 45 minutes  Carollee Herter, DO  [facility]  07/10/2023,  10:23 AM

## 2023-07-10 NOTE — Assessment & Plan Note (Addendum)
 07-10-2023 continue with IV Zyvox. He had admission in 02-2023 where he responded to zyvox and was discharged home. Check ASO titer. Increase IV morphine prn to 4 mg q4h prn. Continue IV lasix for diuresis  07-11-2023 continue with po zyvox. Continue with prn IV morphine/oxycodone. Add nystatin powder to scrotal area. Less erythema on right inner thigh. No tenderness in perineal area/perinuem/scrotum. Doubt Fournier pathology.   07-12-2023 remains on PO zyvox. Change to po lasix. Stop IV lasix. BMP showing signs of contraction alkalosis with CO2 up to 34. On admission, serum CO2 was 31. Pt is compliant with wearing his Bipap.  Plan for DC in AM.  Home health PT and Nurse aide ordered. DME wide walker ordered. Pt declines need for [facility].  07-13-2023 remains afebrile. Tolerating po abx now for 3-4 days now. No indicating for further inpatient hospitalization. Pt with lymphedema of both legs and hyperkeratotic skin. Will DC to home to complete 10 days of po zyvox. Prn oxycodone. Will refer to outpatient ID to see if they have any other suggestions for him to prevent further occurrences of cellulitis. Pt has been diuresed about 12 liters since admission. Weight 727 lbs --->700 lbs at discharge.  At discharge, this is what his right leg/thigh looked like.          On admission, this is what his right leg looked like.

## 2023-07-10 NOTE — Assessment & Plan Note (Addendum)
 07-10-2023 not exacerbated. IV lasix for his edema in his cellulitic leg, not for CHF exacerbation.  07-11-2023 awaiting AM labs.  07-12-2023 Change to po lasix. Stop IV lasix. BMP showing signs of contraction alkalosis with CO2 up to 34. On admission, serum CO2 was 31. Pt did not have any pulmonary edema. IV lasix was for his edematous legs from his cellulitis. He did not have any acute CHF exacerbation.  07-13-2023 stable. DC to home on his demadex 40 mg qday. Admit weight 727. Discharge weight 700. Diuresed off about 12 liters. All the fluid was in his legs.

## 2023-07-10 NOTE — Progress Notes (Signed)
 Pt complained of pain to right leg throughout the night. He reports feeling like his leg is on fire and it goes to his toes. Provider on call notified. Orders were placed for oxycodone prn as well as a one time dose of morphin. Also melatonin was ordered to help with sleep. Will continue to monitor patient.

## 2023-07-10 NOTE — Subjective & Objective (Addendum)
 Pt seen and examined. Remains on PO Zyvox. Only using po oxycodone. Has not yet had BM. Less pain and erythema right lower leg and right medial thigh. Remains afebrile. Plan for DC in AM. Pt wants home health. He promises to work with PT today.  BMP showing signs of contraction alkalosis with CO2 up to 34.  On admission, serum CO2 was 31. Pt is compliant with wearing his Bipap.

## 2023-07-10 NOTE — Assessment & Plan Note (Addendum)
 07-10-2023 Xarelto started by Afib clinic despite his massive obesity. Continue Xarelto.  07-11-2023 continue xarelto.  07-12-2023 stable. continue Xarelto.  07-13-2023 stable

## 2023-07-10 NOTE — Assessment & Plan Note (Addendum)
 07-10-2023 move to telemetry floor due to his hx of atrial flutter. Continue cardizem CD and xarelto.  03-29-225 stable. Chronic. continue cardizem CD and Xarelto.

## 2023-07-10 NOTE — Plan of Care (Signed)

## 2023-07-10 NOTE — Hospital Course (Signed)
 HPI: Willie Steele is a 43 y.o. male with medical history significant for chronic diastolic congestive heart failure, hypertension, chronic atrial flutter, super morbid obesity being admitted to the hospital with recurrent right lower extremity cellulitis.  He gets cellulitis about once per year, he was admitted to this facility in November 2024, treated with Zyvox successfully.  Since that time he was admitted with atrial flutter.  He has been doing well, about 5 days ago he noticed redness and pain in his right lower extremity.  No open wound or injury as far as he can recall.  Started p.o. doxycycline ordered by urgent care.  Initially seem to help, but today he had increased swelling, redness and pain.  Denies any fevers, chills, cough, shortness of breath or other complication.   Significant Events: Admitted 07/09/2023 for right leg cellulitis   Significant Labs: WBC 13.2, HgB 14, Plt 298 Na 139, K 3.6, CO2 31, BUN 24, Scr 0.7, glu 107 BNP 108  Significant Imaging Studies:   Antibiotic Therapy: Anti-infectives (From admission, onward)    Start     Dose/Rate Route Frequency Ordered Stop   07/09/23 1500  linezolid (ZYVOX) IVPB 600 mg        600 mg 300 mL/hr over 60 Minutes Intravenous Every 12 hours 07/09/23 1448         Procedures:   Consultants:

## 2023-07-10 NOTE — Assessment & Plan Note (Signed)
 Body mass index is 87.49 kg/m.

## 2023-07-10 NOTE — Progress Notes (Signed)
 PHARMACIST - PHYSICIAN COMMUNICATION DR:   Carollee Herter CONCERNING: Antibiotic IV to Oral Route Change Policy  RECOMMENDATION: This patient is receiving linezolid by the intravenous route.  Based on criteria approved by the Pharmacy and Therapeutics Committee, the antibiotic(s) is/are being converted to the equivalent oral dose form(s).   DESCRIPTION: These criteria include: Patient being treated for a respiratory tract infection, urinary tract infection, cellulitis or clostridium difficile associated diarrhea if on metronidazole The patient is not neutropenic and does not exhibit a GI malabsorption state The patient is eating (either orally or via tube) and/or has been taking other orally administered medications for a least 24 hours The patient is improving clinically and has a Tmax < 100.5  If you have questions about this conversion, please contact the Pharmacy Department  []   [phone] )  [facility] []   [phone] )  [facility]  []   [phone] )  [facility] [x]   [phone] )  [facility]    Thank you for allowing pharmacy to be a part of this patient's care.   Lyn Henri, [facility] PharmD Candidate 07/10/2023 11:25 AM

## 2023-07-11 DIAGNOSIS — I5032 Chronic diastolic (congestive) heart failure: Secondary | ICD-10-CM

## 2023-07-11 DIAGNOSIS — I483 Typical atrial flutter: Secondary | ICD-10-CM | POA: Diagnosis not present

## 2023-07-11 DIAGNOSIS — Z7901 Long term (current) use of anticoagulants: Secondary | ICD-10-CM | POA: Diagnosis not present

## 2023-07-11 DIAGNOSIS — L03115 Cellulitis of right lower limb: Secondary | ICD-10-CM | POA: Diagnosis not present

## 2023-07-11 DIAGNOSIS — G4733 Obstructive sleep apnea (adult) (pediatric): Secondary | ICD-10-CM | POA: Diagnosis not present

## 2023-07-11 LAB — CBC WITH DIFFERENTIAL/PLATELET
Abs Immature Granulocytes: 0.1 10*3/uL — ABNORMAL HIGH (ref 0.00–0.07)
Basophils Absolute: 0 10*3/uL (ref 0.0–0.1)
Basophils Relative: 0 %
Eosinophils Absolute: 0.1 10*3/uL (ref 0.0–0.5)
Eosinophils Relative: 1 %
HCT: 46.8 % (ref 39.0–52.0)
Hemoglobin: 14.5 g/dL (ref 13.0–17.0)
Immature Granulocytes: 1 %
Lymphocytes Relative: 14 %
Lymphs Abs: 1.5 10*3/uL (ref 0.7–4.0)
MCH: 31 pg (ref 26.0–34.0)
MCHC: 31 g/dL (ref 30.0–36.0)
MCV: 100.2 fL — ABNORMAL HIGH (ref 80.0–100.0)
Monocytes Absolute: 0.8 10*3/uL (ref 0.1–1.0)
Monocytes Relative: 7 %
Neutro Abs: 8.3 10*3/uL — ABNORMAL HIGH (ref 1.7–7.7)
Neutrophils Relative %: 77 %
Platelets: 244 10*3/uL (ref 150–400)
RBC: 4.67 MIL/uL (ref 4.22–5.81)
RDW: 15.8 % — ABNORMAL HIGH (ref 11.5–15.5)
WBC: 10.7 10*3/uL — ABNORMAL HIGH (ref 4.0–10.5)
nRBC: 0 % (ref 0.0–0.2)

## 2023-07-11 LAB — COMPREHENSIVE METABOLIC PANEL WITH GFR
ALT: 38 U/L (ref 0–44)
AST: 22 U/L (ref 15–41)
Albumin: 2.8 g/dL — ABNORMAL LOW (ref 3.5–5.0)
Alkaline Phosphatase: 53 U/L (ref 38–126)
Anion gap: 13 (ref 5–15)
BUN: 15 mg/dL (ref 6–20)
CO2: 31 mmol/L (ref 22–32)
Calcium: 8.9 mg/dL (ref 8.9–10.3)
Chloride: 93 mmol/L — ABNORMAL LOW (ref 98–111)
Creatinine, Ser: 0.7 mg/dL (ref 0.61–1.24)
GFR, Estimated: >60 mL/min (ref >60)
Glucose, Bld: 104 mg/dL — ABNORMAL HIGH (ref 70–99)
Potassium: 4 mmol/L (ref 3.5–5.1)
Sodium: 137 mmol/L (ref 135–145)
Total Bilirubin: 1.3 mg/dL — ABNORMAL HIGH (ref 0.0–1.2)
Total Protein: 7 g/dL (ref 6.5–8.1)

## 2023-07-11 LAB — MAGNESIUM: Magnesium: 1.9 mg/dL (ref 1.7–2.4)

## 2023-07-11 LAB — ANTISTREPTOLYSIN O TITER: ASO: 29 [IU]/mL (ref 0.0–200.0)

## 2023-07-11 MED ORDER — BISACODYL 5 MG PO TBEC
10.0000 mg | DELAYED_RELEASE_TABLET | Freq: Every day | ORAL | Status: DC
  Administered 2023-07-11: 10 mg via ORAL
  Filled 2023-07-11: qty 2

## 2023-07-11 MED ORDER — NYSTATIN 100000 UNIT/GM EX POWD
Freq: Three times a day (TID) | CUTANEOUS | Status: DC
  Administered 2023-07-11: 1 via TOPICAL
  Filled 2023-07-11: qty 15

## 2023-07-11 NOTE — Plan of Care (Signed)

## 2023-07-11 NOTE — Progress Notes (Signed)
 PROGRESS NOTE    Willie Steele  ZOX:[id number] DOB: 1980-05-21 DOA: 07/09/2023 PCP: Tracey Harries, MD  Subjective: Pt seen and examined. Moved to telemetry floor. Tele shows rate controlled atrial flutter. No fevers. On po zyvox. Right lower leg is still tender to touch. ASO titer on 29.  Less erythema right inner thigh.   Hospital Course: HPI: Willie Steele is a 43 y.o. male with medical history significant for chronic diastolic congestive heart failure, hypertension, chronic atrial flutter, super morbid obesity being admitted to the hospital with recurrent right lower extremity cellulitis.  He gets cellulitis about once per year, he was admitted to this facility in November 2024, treated with Zyvox successfully.  Since that time he was admitted with atrial flutter.  He has been doing well, about 5 days ago he noticed redness and pain in his right lower extremity.  No open wound or injury as far as he can recall.  Started p.o. doxycycline ordered by urgent care.  Initially seem to help, but today he had increased swelling, redness and pain.  Denies any fevers, chills, cough, shortness of breath or other complication.   Significant Events: Admitted 07/09/2023 for right leg cellulitis   Significant Labs: WBC 13.2, HgB 14, Plt 298 Na 139, K 3.6, CO2 31, BUN 24, Scr 0.7, glu 107 BNP 108  Significant Imaging Studies:   Antibiotic Therapy: Anti-infectives (From admission, onward)    Start     Dose/Rate Route Frequency Ordered Stop   07/09/23 1500  linezolid (ZYVOX) IVPB 600 mg        600 mg 300 mL/hr over 60 Minutes Intravenous Every 12 hours 07/09/23 1448         Procedures:   Consultants:     Assessment and Plan: * Cellulitis of right leg 07-10-2023 continue with IV Zyvox. He had admission in 02-2023 where he responded to zyvox and was discharged home. Check ASO titer. Increase IV morphine prn to 4 mg q4h prn. Continue IV lasix for diuresis  07-11-2023 continue with po  zyvox. Continue with prn IV morphine/oxycodone. Add nystatin powder to scrotal area. Less erythema on right inner thigh. No tenderness in perineal area/perinuem/scrotum. Doubt Fournier pathology.   Chronic anticoagulation - on Xarelto for atrial flutter 07-10-2023 Xarelto started by Afib clinic despite his massive obesity. Continue Xarelto.  07-11-2023 continue xarelto.  Atrial flutter (HCC) 07-10-2023 move to telemetry floor due to his hx of atrial flutter. Continue cardizem CD and xarelto.  03-29-225 stable. Chronic. continue cardizem CD and Xarelto.  OSA treated with BiPAP 07-10-2023 continue bipap  07-11-2023 stable on bipap  Super obesity Body mass index is 87.49 kg/m.   Chronic diastolic heart failure (HCC) 07-10-2023 not exacerbated. IV lasix for his edema in his cellulitic leg, not for CHF exacerbation.  07-11-2023 awaiting AM labs.   DVT prophylaxis:  rivaroxaban (XARELTO) tablet 20 mg     Code Status: Full Code Family Communication: no family at bedside. Pt is decisional. Disposition Plan: return home Reason for continuing need for hospitalization: monitoring erythema right inner medial thigh and right lower leg. Remains on IV lasix.  Objective: Vitals:   07/10/23 1528 07/10/23 2058 07/11/23 0447 07/11/23 0448  BP:  (!) 140/84 (!) 161/101 (!) 154/102  Pulse: 82 (!) 107 88 99  Resp:  18 20   Temp:  98.5 F (36.9 C) 98 F (36.7 C)   TempSrc:  Oral Oral   SpO2: 97% 94% 100%   Weight:      Height:  Intake/Output Summary (Last 24 hours) at 07/11/2023 1016 Last data filed at 07/10/2023 2105 Gross per 24 hour  Intake 237 ml  Output 5000 ml  Net -4763 ml   Filed Weights   07/09/23 1303 07/09/23 1708  Weight: (!) 329.9 kg (!) 317.5 kg    Examination:  Physical Exam Vitals and nursing note reviewed.  Constitutional:      Appearance: He is obese.  HENT:     Head: Normocephalic and atraumatic.     Nose: Nose normal.  Eyes:     General: No  scleral icterus. Cardiovascular:     Rate and Rhythm: Normal rate. Rhythm irregular.  Pulmonary:     Effort: Pulmonary effort is normal.  Abdominal:     General: Abdomen is protuberant. Bowel sounds are normal.     Palpations: Abdomen is soft.  Skin:    Capillary Refill: Capillary refill takes less than 2 seconds.          Comments: Pt with hypertrophic, nodular skin on bilateral lower legs/ankles. Hyperkeratotic lesions over bilateral lower legs. Appears chronic in nature.  Neurological:     Mental Status: He is alert and oriented to person, place, and time.     Data Reviewed: I have personally reviewed following labs and imaging studies  CBC: Recent Labs  Lab 07/09/23 1451 07/10/23 0356  WBC 13.2* 9.7  NEUTROABS 10.5*  --   HGB 14.0 12.1*  HCT 46.8 38.1*  MCV 102.4* 98.4  PLT 298 220   Basic Metabolic Panel: Recent Labs  Lab 07/09/23 1451 07/10/23 0356  NA 139 136  K 3.6 3.9  CL 98 99  CO2 31 29  GLUCOSE 107* 112*  BUN 24* 22*  CREATININE 0.70 0.69  CALCIUM 8.8* 8.2*   GFR: Estimated Creatinine Clearance: 302.3 mL/min (by C-G formula based on SCr of 0.69 mg/dL).  Liver Function Tests: Recent Labs  Lab 07/09/23 1451  AST 33  ALT 63*  ALKPHOS 53  BILITOT 1.0  PROT 7.3  ALBUMIN 3.4*   BNP (last 3 results) Recent Labs    03/04/23 1135 03/25/23 0907 07/09/23 1451  BNP 95.7 73.0 108.1*   Radiology Studies: DG Tibia/Fibula Right Port Result Date: 07/09/2023 CLINICAL DATA:  Right leg pain for several days. EXAM: PORTABLE RIGHT TIBIA AND FIBULA - 2 VIEW COMPARISON:  None Available. FINDINGS: There is no evidence of fracture or other focal bone lesions. Soft tissues are unremarkable. IMPRESSION: Negative. Electronically Signed   By: Lupita Raider M.D.   On: 07/09/2023 16:01   [place] RT LOWER EXTREM LTD SOFT TISSUE NON VASCULAR Result Date: 07/09/2023 CLINICAL DATA:  Concern for abscess in the right lower extremity EXAM: ULTRASOUND RIGHT LOWER EXTREMITY  LIMITED TECHNIQUE: Ultrasound examination of the lower extremity soft tissues was performed in the area of clinical concern. COMPARISON:  None Available. FINDINGS: Ultrasound performed in the right anterior calf in the area concern. Soft tissue/subcutaneous edema noted in the area concern. No focal fluid collection to suggest abscess. IMPRESSION: Edema noted in the area concern.  No focal fluid collection/abscess. Electronically Signed   By: Charlett Nose M.D.   On: 07/09/2023 14:07    Scheduled Meds:  bisacodyl  10 mg Oral Daily   diltiazem  360 mg Oral Daily   furosemide  80 mg Intravenous BID   linezolid  600 mg Oral Q12H   melatonin  10 mg Oral QHS   metoprolol tartrate  25 mg Oral BID   nystatin   Topical TID  potassium chloride SA  40 mEq Oral BID   rivaroxaban  20 mg Oral Q supper   Continuous Infusions:   LOS: 2 days   Time spent: 40 minutes  Carollee Herter, DO  [facility]  07/11/2023, 10:16 AM

## 2023-07-11 NOTE — Evaluation (Signed)
 Physical Therapy Evaluation Patient Details Name: Willie Steele MRN: [id number] DOB: 1980-12-22 Today's Date: 07/11/2023  History of Present Illness  43 y.o. male  admitted to the hospital with recurrent right lower extremity cellulitis. PMH: chronic diastolic CHF, HTN, chronic atrial flutter, super morbid obesity  Clinical Impression  Pt admitted with above diagnosis.  Pt currently on lasix and voiding frequently therefore politely declines OOB. Pt reports he was up to window seat earlier with nursing staff, RLE very painful with WBing. Contacted portable equipment for proper RW and and bariatric recliner. Will follow in acute setting and continue efforts to mobilize  Pt currently with functional limitations due to the deficits listed below (see PT Problem List). Pt will benefit from acute skilled PT to increase their independence and safety with mobility to allow discharge.           If plan is discharge home, recommend the following:     Can travel by private vehicle        Equipment Recommendations Other (comment) (TBD)  Recommendations for Other Services       Functional Status Assessment Patient has had a recent decline in their functional status and demonstrates the ability to make significant improvements in function in a reasonable and predictable amount of time.     Precautions / Restrictions Precautions Precautions: Fall Restrictions Weight Bearing Restrictions Per Provider Order: No      Mobility  Bed Mobility               General bed mobility comments: politely declines d/t lasix, voiding frequently    Transfers                        Ambulation/Gait                  Stairs            Wheelchair Mobility     Tilt Bed    Modified Rankin (Stroke Patients Only)       Balance                                             Pertinent Vitals/Pain Pain Assessment Pain Assessment: Faces Faces Pain  Scale: Hurts even more Pain Location: right LE Pain Descriptors / Indicators: Discomfort, Sore Pain Intervention(s): Limited activity within patient's tolerance, Monitored during session    Home Living Family/patient expects to be discharged to:: Private residence Living Arrangements: Alone Available Help at Discharge: Family;Available PRN/intermittently Type of Home: Apartment Home Access: Level entry       Home Layout: One level Home Equipment: Cane - single point;Shower seat      Prior Function Prior Level of Function : Independent/Modified Independent             Mobility Comments: uses SPC when he has knee pain, h/o 1 fall in past 6 months ADLs Comments: showers in walk in shower with shower seat     Extremity/Trunk Assessment   Upper Extremity Assessment Upper Extremity Assessment: Overall WFL for tasks assessed    Lower Extremity Assessment Lower Extremity Assessment: RLE deficits/detail;LLE deficits/detail RLE Deficits / Details: limited by pain and body habitus; grossly 2+/5 LLE Deficits / Details: AROM grossly WFL within limits of body  habitus; strength grossly 4/5       Communication   Communication Communication: No apparent difficulties  Cognition Arousal: Alert Behavior During Therapy: WFL for tasks assessed/performed   PT - Cognitive impairments: No apparent impairments                       PT - Cognition Comments: pt is pleasant and willing to work with PT in acute setting Following commands: Intact       Cueing Cueing Techniques: Verbal cues     General Comments General comments (skin integrity, edema, etc.): pt reports he was up to window seat with nursing staff    Exercises     Assessment/Plan    PT Assessment Patient needs continued PT services  PT Problem List Pain;Obesity;Decreased activity tolerance       PT Treatment Interventions DME instruction;Gait training;Functional mobility training;Therapeutic  activities;Patient/family education;Therapeutic exercise    PT Goals (Current goals can be found in the Care Plan section)  Acute Rehab PT Goals Patient Stated Goal: to walk more and have less LE pain PT Goal Formulation: With patient Time For Goal Achievement: 07/25/23 Potential to Achieve Goals: Good    Frequency Min 3X/week     Co-evaluation               AM-PAC PT "6 Clicks" Mobility  Outcome Measure Help needed turning from your back to your side while in a flat bed without using bedrails?: Total Help needed moving from lying on your back to sitting on the side of a flat bed without using bedrails?: A Lot Help needed moving to and from a bed to a chair (including a wheelchair)?: A Little Help needed standing up from a chair using your arms (e.g., wheelchair or bedside chair)?: A Little Help needed to walk in hospital room?: A Little Help needed climbing 3-5 steps with a railing? : A Little 6 Click Score: 15    End of Session   Activity Tolerance: Patient tolerated treatment well Patient left: in bed;with call bell/phone within reach Nurse Communication: Mobility status;Other (comment) (checking with portable for proper equipment - Bariatric RW and recliner) PT Visit Diagnosis: Other abnormalities of gait and mobility (R26.89)    Time: 9528-4132 PT Time Calculation (min) (ACUTE ONLY): 15 min   Charges:   PT Evaluation $PT Eval Low Complexity: 1 Low   PT General Charges $$ ACUTE PT VISIT: 1 Visit         Matthan Sledge, PT  Acute Rehab Dept [facility]) [phone]  07/11/2023   [facility] 07/11/2023, 12:43 PM

## 2023-07-11 NOTE — Progress Notes (Signed)
   07/11/23 1956  BiPAP/CPAP/SIPAP  BiPAP/CPAP/SIPAP Pt Type Adult  BiPAP/CPAP/SIPAP  (self placed pts home machine)  Mask Type Full face mask  Dentures removed? Not applicable  Mask Size Extra large  Flow Rate 3 lpm  Patient Home Machine Yes  Safety Check Completed by RT for Home Unit Yes, no issues noted  Patient Home Mask Yes  Patient Home Tubing Yes  Auto Titrate  (pts home settings)  Device Plugged into RED Power Outlet Yes  BiPAP/CPAP /SiPAP Vitals  Pulse Rate 98  Resp 20  SpO2 98 %  Bilateral Breath Sounds Diminished  MEWS Score/Color  MEWS Score 0  MEWS Score Color Willie Steele

## 2023-07-11 NOTE — Plan of Care (Signed)

## 2023-07-12 DIAGNOSIS — Z7901 Long term (current) use of anticoagulants: Secondary | ICD-10-CM | POA: Diagnosis not present

## 2023-07-12 DIAGNOSIS — I483 Typical atrial flutter: Secondary | ICD-10-CM | POA: Diagnosis not present

## 2023-07-12 DIAGNOSIS — L03115 Cellulitis of right lower limb: Secondary | ICD-10-CM | POA: Diagnosis not present

## 2023-07-12 DIAGNOSIS — G4733 Obstructive sleep apnea (adult) (pediatric): Secondary | ICD-10-CM | POA: Diagnosis not present

## 2023-07-12 LAB — BASIC METABOLIC PANEL WITH GFR
Anion gap: 9 (ref 5–15)
BUN: 17 mg/dL (ref 6–20)
CO2: 34 mmol/L — ABNORMAL HIGH (ref 22–32)
Calcium: 9.3 mg/dL (ref 8.9–10.3)
Chloride: 92 mmol/L — ABNORMAL LOW (ref 98–111)
Creatinine, Ser: 0.7 mg/dL (ref 0.61–1.24)
GFR, Estimated: >60 mL/min (ref >60)
Glucose, Bld: 108 mg/dL — ABNORMAL HIGH (ref 70–99)
Potassium: 4.8 mmol/L (ref 3.5–5.1)
Sodium: 135 mmol/L (ref 135–145)

## 2023-07-12 LAB — MAGNESIUM: Magnesium: 2.2 mg/dL (ref 1.7–2.4)

## 2023-07-12 MED ORDER — POLYETHYLENE GLYCOL 3350 17 G PO PACK
34.0000 g | PACK | Freq: Every day | ORAL | Status: DC
  Filled 2023-07-12: qty 2

## 2023-07-12 MED ORDER — ACETAMINOPHEN 650 MG RE SUPP: 650.0000 mg | Freq: Four times a day (QID) | RECTAL | Status: DC | PRN

## 2023-07-12 MED ORDER — ACETAMINOPHEN 500 MG PO TABS
1000.0000 mg | ORAL_TABLET | Freq: Four times a day (QID) | ORAL | Status: DC | PRN
  Administered 2023-07-12: 1000 mg via ORAL
  Filled 2023-07-12: qty 2

## 2023-07-12 NOTE — Plan of Care (Signed)

## 2023-07-12 NOTE — Progress Notes (Signed)
 Pt is currently on Home BiPAP unit at this time and is tolerating well. No distress noted.  Will continue to monitor patient.

## 2023-07-12 NOTE — Progress Notes (Signed)
 Physical Therapy Treatment Patient Details Name: Willie Steele MRN: [id number] DOB: 04/04/1981 Today's Date: 07/12/2023   History of Present Illness 43 y.o. male  admitted to the hospital with recurrent right lower extremity cellulitis. PMH: chronic diastolic CHF, HTN, chronic atrial flutter, super morbid obesity    PT Comments  Pt agreeable to mobilize with encouragement; pt has multiple c/o about bed/mattress--if stay is prolonged will work towards getting a different bari-bed.  Pt amb 6' with RW and ~ 57' with [facility] reporting decr pain RLE after amb initiated. Supervision for all aspects other than bed mobility for which pt requires +2 to 3 assist d/t lack adequate space/bed features. Encouraged pt to continue to mobilize/be OOB. Pt reports he amb "a few thousand" steps a day and "leads a normal life"; pt verbalizes understanding of the importance of mobility while in acute setting. Continue to follow. No f/u or DME needs   If plan is discharge home, recommend the following:     Can travel by private vehicle        Equipment Recommendations  None recommended by PT    Recommendations for Other Services       Precautions / Restrictions Precautions Precautions: Fall Recall of Precautions/Restrictions: Intact Restrictions Weight Bearing Restrictions Per Provider Order: No     Mobility  Bed Mobility Overal bed mobility: Needs Assistance Bed Mobility: Supine to Sit     Supine to sit: Used rails, Min assist (+3 assist available)     General bed mobility comments: pt able to rotate in supine, assist with RLE,  RN HHA  pt  R hand and use momentum to come to sit; pt reports he sleeps in king size bed, exits R side of bed at home    Transfers Overall transfer level: Needs assistance Equipment used: Rolling walker (2 wheels), Straight cane Transfers: Sit to/from Stand Sit to Stand: Supervision           General transfer comment: STS from bed, recliner and window seat.     Ambulation/Gait Ambulation/Gait assistance: Supervision Gait Distance (Feet): 15 Feet (6') Assistive device: Rolling walker (2 wheels), Straight cane Gait Pattern/deviations: Step-through pattern, Wide base of support       General Gait Details: pt transitioned from RW to cane. supervision for safety; wide BOS d/t body habitus;   Stairs             Wheelchair Mobility     Tilt Bed    Modified Rankin (Stroke Patients Only)       Balance                                            Communication Communication Communication: No apparent difficulties  Cognition Arousal: Alert Behavior During Therapy: WFL for tasks assessed/performed   PT - Cognitive impairments: No apparent impairments                         Following commands: Intact      Cueing Cueing Techniques: Verbal cues  Exercises      General Comments        Pertinent Vitals/Pain Pain Assessment Pain Assessment: Faces Faces Pain Scale: Hurts even more Pain Location: right LE Pain Descriptors / Indicators: Discomfort, Sore, Throbbing Pain Intervention(s): Limited activity within patient's tolerance, Monitored during session, Repositioned    Home Living  Prior Function            PT Goals (current goals can now be found in the care plan section) Acute Rehab PT Goals Patient Stated Goal: to walk more and have less LE pain PT Goal Formulation: With patient Time For Goal Achievement: 07/25/23 Potential to Achieve Goals: Good Progress towards PT goals: Progressing toward goals    Frequency    Min 3X/week      PT Plan      Co-evaluation              AM-PAC PT "6 Clicks" Mobility   Outcome Measure  Help needed turning from your back to your side while in a flat bed without using bedrails?: Total Help needed moving from lying on your back to sitting on the side of a flat bed without using bedrails?: A Lot Help  needed moving to and from a bed to a chair (including a wheelchair)?: None Help needed standing up from a chair using your arms (e.g., wheelchair or bedside chair)?: None Help needed to walk in hospital room?: None Help needed climbing 3-5 steps with a railing? : None 6 Click Score: 19    End of Session   Activity Tolerance: Patient tolerated treatment well Patient left: Other (comment) (window seat) Nurse Communication: Mobility status PT Visit Diagnosis: Other abnormalities of gait and mobility (R26.89)     Time: 1610-9604 PT Time Calculation (min) (ACUTE ONLY): 22 min  Charges:    $Gait Training: 8-22 mins PT General Charges $$ ACUTE PT VISIT: 1 Visit                     Joyice Magda, PT  Acute Rehab Dept [facility]) [phone]  07/12/2023    [facility] 07/12/2023, 11:40 AM

## 2023-07-12 NOTE — Progress Notes (Signed)
   07/12/23 1149  TOC Brief Assessment  Insurance and Status Reviewed  Patient has primary care physician Yes  Home environment has been reviewed return home  Prior level of function: independent  Prior/Current Home Services No current home services  Social Drivers of Health Review SDOH reviewed no interventions necessary  Readmission risk has been reviewed Yes  Transition of care needs no transition of care needs at this time

## 2023-07-12 NOTE — Progress Notes (Signed)
 PROGRESS NOTE    Willie Steele  UEA:[id number] DOB: 1981/04/07 DOA: 07/09/2023 PCP: Tracey Harries, MD  Subjective: Pt seen and examined. Remains on PO Zyvox. Only using po oxycodone. Has not yet had BM. Less pain and erythema right lower leg and right medial thigh. Remains afebrile. Plan for DC in AM. Pt wants home health. He promises to work with PT today.  BMP showing signs of contraction alkalosis with CO2 up to 34.  On admission, serum CO2 was 31. Pt is compliant with wearing his Bipap.   Hospital Course: HPI: Willie Steele is a 43 y.o. male with medical history significant for chronic diastolic congestive heart failure, hypertension, chronic atrial flutter, super morbid obesity being admitted to the hospital with recurrent right lower extremity cellulitis.  He gets cellulitis about once per year, he was admitted to this facility in November 2024, treated with Zyvox successfully.  Since that time he was admitted with atrial flutter.  He has been doing well, about 5 days ago he noticed redness and pain in his right lower extremity.  No open wound or injury as far as he can recall.  Started p.o. doxycycline ordered by urgent care.  Initially seem to help, but today he had increased swelling, redness and pain.  Denies any fevers, chills, cough, shortness of breath or other complication.   Significant Events: Admitted 07/09/2023 for right leg cellulitis   Significant Labs: WBC 13.2, HgB 14, Plt 298 Na 139, K 3.6, CO2 31, BUN 24, Scr 0.7, glu 107 BNP 108  Significant Imaging Studies:   Antibiotic Therapy: Anti-infectives (From admission, onward)    Start     Dose/Rate Route Frequency Ordered Stop   07/09/23 1500  linezolid (ZYVOX) IVPB 600 mg        600 mg 300 mL/hr over 60 Minutes Intravenous Every 12 hours 07/09/23 1448         Procedures:   Consultants:     Assessment and Plan: * Cellulitis of right leg 07-10-2023 continue with IV Zyvox. He had admission in 02-2023  where he responded to zyvox and was discharged home. Check ASO titer. Increase IV morphine prn to 4 mg q4h prn. Continue IV lasix for diuresis  07-11-2023 continue with po zyvox. Continue with prn IV morphine/oxycodone. Add nystatin powder to scrotal area. Less erythema on right inner thigh. No tenderness in perineal area/perinuem/scrotum. Doubt Fournier pathology.   07-12-2023 remains on PO zyvox. Change to po lasix. Stop IV lasix. BMP showing signs of contraction alkalosis with CO2 up to 34. On admission, serum CO2 was 31. Pt is compliant with wearing his Bipap.  Plan for DC in AM.  Home health PT and Nurse aide ordered. DME wide walker ordered. Pt declines need for BSC.  Chronic anticoagulation - on Xarelto for atrial flutter 07-10-2023 Xarelto started by Afib clinic despite his massive obesity. Continue Xarelto.  07-11-2023 continue xarelto.  03-29-225 stable. continue Xarelto.  Atrial flutter (HCC) 07-10-2023 move to telemetry floor due to his hx of atrial flutter. Continue cardizem CD and xarelto.  07-11-2023 stable. Chronic. continue cardizem CD and Xarelto.  07-12-2023 stable. Chronic. continue cardizem CD and Xarelto.  OSA treated with BiPAP 07-10-2023 continue bipap  07-11-2023 stable on bipap  07-12-2023 stable on bipap.  Super obesity Body mass index is 87.49 kg/m.   Chronic diastolic heart failure (HCC) 07-10-2023 not exacerbated. IV lasix for his edema in his cellulitic leg, not for CHF exacerbation.  07-11-2023 awaiting AM labs.  07-12-2023 Change to po lasix.  Stop IV lasix. BMP showing signs of contraction alkalosis with CO2 up to 34. On admission, serum CO2 was 31. Pt did not have any pulmonary edema. IV lasix was for his edematous legs from his cellulitis. He did not have any acute CHF exacerbation.  DVT prophylaxis:  rivaroxaban (XARELTO) tablet 20 mg     Code Status: Full Code Family Communication: no family at bedside. Pt is decisional. Disposition  Plan: return home Reason for continuing need for hospitalization: monitoring leg cellulitis. Stopping IV lasix. Plan for DC in AM.  Objective: Vitals:   07/11/23 1220 07/11/23 1956 07/11/23 2022 07/12/23 0549  BP: (!) 143/96  (!) 145/68 108/76  Pulse: 99 98 75 100  Resp: 20 20 18 20   Temp: 97.8 F (36.6 C)  98.2 F (36.8 C) 98.3 F (36.8 C)  TempSrc: Oral  Oral   SpO2:  98% 98% 100%  Weight:      Height:        Intake/Output Summary (Last 24 hours) at 07/12/2023 0959 Last data filed at 07/12/2023 8295 Gross per 24 hour  Intake 120 ml  Output 9000 ml  Net -8880 ml   Filed Weights   07/09/23 1303 07/09/23 1708  Weight: (!) 329.9 kg (!) 317.5 kg    Examination:  Physical Exam Vitals and nursing note reviewed.  Constitutional:      Appearance: He is obese.  HENT:     Head: Normocephalic and atraumatic.  Pulmonary:     Effort: Pulmonary effort is normal. No respiratory distress.  Abdominal:     General: Abdomen is protuberant. There is no distension.  Skin:    Capillary Refill: Capillary refill takes less than 2 seconds.     Comments: Fading erythema right medial inner thigh. Minimal erythema towards his scrotal area.  Outside Right lower leg shows purple-ish hue but fading angry red erythema  Neurological:     Mental Status: He is alert and oriented to person, place, and time.    Data Reviewed: I have personally reviewed following labs and imaging studies  CBC: Recent Labs  Lab 07/09/23 1451 07/10/23 0356 07/11/23 1214  WBC 13.2* 9.7 10.7*  NEUTROABS 10.5*  --  8.3*  HGB 14.0 12.1* 14.5  HCT 46.8 38.1* 46.8  MCV 102.4* 98.4 100.2*  PLT 298 220 244   Basic Metabolic Panel: Recent Labs  Lab 07/09/23 1451 07/10/23 0356 07/11/23 1214 07/12/23 0839  NA 139 136 137 135  K 3.6 3.9 4.0 4.8  CL 98 99 93* 92*  CO2 31 29 31  34*  GLUCOSE 107* 112* 104* 108*  BUN 24* 22* 15 17  CREATININE 0.70 0.69 0.70 0.70  CALCIUM 8.8* 8.2* 8.9 9.3  MG  --   --  1.9 2.2    GFR: Estimated Creatinine Clearance: 302.3 mL/min (by C-G formula based on SCr of 0.7 mg/dL). Liver Function Tests: Recent Labs  Lab 07/09/23 1451 07/11/23 1214  AST 33 22  ALT 63* 38  ALKPHOS 53 53  BILITOT 1.0 1.3*  PROT 7.3 7.0  ALBUMIN 3.4* 2.8*   BNP (last 3 results) Recent Labs    03/04/23 1135 03/25/23 0907 07/09/23 1451  BNP 95.7 73.0 108.1*   Scheduled Meds:  bisacodyl  10 mg Oral Daily   diltiazem  360 mg Oral Daily   furosemide  80 mg Intravenous BID   linezolid  600 mg Oral Q12H   melatonin  10 mg Oral QHS   metoprolol tartrate  25 mg Oral BID  nystatin   Topical TID   polyethylene glycol  34 g Oral Daily   potassium chloride SA  40 mEq Oral BID   rivaroxaban  20 mg Oral Q supper   Continuous Infusions:   LOS: 3 days   Time spent: 40 minutes  Carollee Herter, DO  [facility]  07/12/2023, 9:59 AM

## 2023-07-13 ENCOUNTER — Other Ambulatory Visit (HOSPITAL_COMMUNITY): Payer: Self-pay

## 2023-07-13 ENCOUNTER — Ambulatory Visit: Admitting: Orthopaedic Surgery

## 2023-07-13 DIAGNOSIS — L03115 Cellulitis of right lower limb: Secondary | ICD-10-CM | POA: Diagnosis not present

## 2023-07-13 DIAGNOSIS — G4733 Obstructive sleep apnea (adult) (pediatric): Secondary | ICD-10-CM | POA: Diagnosis not present

## 2023-07-13 DIAGNOSIS — I483 Typical atrial flutter: Secondary | ICD-10-CM | POA: Diagnosis not present

## 2023-07-13 DIAGNOSIS — Z7901 Long term (current) use of anticoagulants: Secondary | ICD-10-CM | POA: Diagnosis not present

## 2023-07-13 LAB — BASIC METABOLIC PANEL WITH GFR
Anion gap: 11 (ref 5–15)
BUN: 18 mg/dL (ref 6–20)
CO2: 30 mmol/L (ref 22–32)
Calcium: 9 mg/dL (ref 8.9–10.3)
Chloride: 94 mmol/L — ABNORMAL LOW (ref 98–111)
Creatinine, Ser: 0.79 mg/dL (ref 0.61–1.24)
GFR, Estimated: >60 mL/min (ref >60)
Glucose, Bld: 110 mg/dL — ABNORMAL HIGH (ref 70–99)
Potassium: 4.3 mmol/L (ref 3.5–5.1)
Sodium: 135 mmol/L (ref 135–145)

## 2023-07-13 LAB — CBC WITH DIFFERENTIAL/PLATELET
Abs Immature Granulocytes: 0.1 10*3/uL — ABNORMAL HIGH (ref 0.00–0.07)
Basophils Absolute: 0 10*3/uL (ref 0.0–0.1)
Basophils Relative: 0 %
Eosinophils Absolute: 0.1 10*3/uL (ref 0.0–0.5)
Eosinophils Relative: 1 %
HCT: 46.8 % (ref 39.0–52.0)
Hemoglobin: 14.3 g/dL (ref 13.0–17.0)
Immature Granulocytes: 1 %
Lymphocytes Relative: 23 %
Lymphs Abs: 2 10*3/uL (ref 0.7–4.0)
MCH: 30.6 pg (ref 26.0–34.0)
MCHC: 30.6 g/dL (ref 30.0–36.0)
MCV: 100.2 fL — ABNORMAL HIGH (ref 80.0–100.0)
Monocytes Absolute: 0.7 10*3/uL (ref 0.1–1.0)
Monocytes Relative: 8 %
Neutro Abs: 5.8 10*3/uL (ref 1.7–7.7)
Neutrophils Relative %: 67 %
Platelets: 230 10*3/uL (ref 150–400)
RBC: 4.67 MIL/uL (ref 4.22–5.81)
RDW: 15.7 % — ABNORMAL HIGH (ref 11.5–15.5)
WBC: 8.8 10*3/uL (ref 4.0–10.5)
nRBC: 0 % (ref 0.0–0.2)

## 2023-07-13 LAB — MAGNESIUM: Magnesium: 2.2 mg/dL (ref 1.7–2.4)

## 2023-07-13 MED ORDER — OXYCODONE HCL 10 MG PO TABS: 10.0000 mg | ORAL_TABLET | Freq: Four times a day (QID) | ORAL | 0 refills | Status: DC | PRN

## 2023-07-13 MED ORDER — LINEZOLID 600 MG PO TABS: 600.0000 mg | ORAL_TABLET | Freq: Two times a day (BID) | ORAL | 0 refills | Status: AC

## 2023-07-13 MED ORDER — TORSEMIDE 20 MG PO TABS
40.0000 mg | ORAL_TABLET | Freq: Every day | ORAL | Status: DC
  Filled 2023-07-13: qty 2

## 2023-07-13 MED ORDER — CHLORHEXIDINE GLUCONATE 2 % EX LIQD: CUTANEOUS | 0 refills | Status: DC

## 2023-07-13 NOTE — TOC Transition Note (Signed)
 Transition of Care [facility]) - Discharge Note   Patient Details  Name: Willie Steele MRN: [id number] Date of Birth: [birth year]  [facility] CM/SW Contact:  Otelia Santee, LCSW Phone Number: 07/13/2023, 11:20 AM   Clinical Narrative:    Pt to return home with home health services at DC. Pt denies having HHA preference. HHPT/RN/Aide has been arranged with [facility]. HH orders in place. Pt declined having RW ordered at this time. Pt's mother to provide transportation for pt at discharge. No further TOC needs identified.    Final next level of care: Home w Home Health Services Barriers to Discharge: No Barriers Identified   Patient Goals and CMS Choice Patient states their goals for this hospitalization and ongoing recovery are:: To return home CMS Medicare.gov Compare Post Acute Care list provided to:: Patient Choice offered to / list presented to : Patient [place] ownership interest in [facility]provided to::  (NA)    Discharge Placement                       Discharge Plan and Services Additional resources added to the After Visit Summary for                  DME Arranged: N/A DME Agency: NA       HH Arranged: PT, RN, Nurse's Aide HH Agency: [facility] Date [facility] Agency Contacted: 07/13/23 Time HH Agency Contacted: 1120 Representative spoke with at [facility] Agency: Amy  Social Drivers of Health (SDOH) Interventions SDOH Screenings   Food Insecurity: No Food Insecurity (07/09/2023)  Housing: Unknown (07/09/2023)  Transportation Needs: No Transportation Needs (07/09/2023)  Utilities: Not At Risk (07/09/2023)  Financial Resource Strain: Low Risk  (07/06/2023)   Received from [facility]  Physical Activity: Unknown (07/06/2023)   Received from [facility]  Social Connections: Somewhat Isolated (07/06/2023)   [facility]  Stress: Stress Concern Present (07/06/2023)   Received from [facility]  Tobacco Use: Medium Risk  (07/09/2023)     Readmission Risk Interventions    07/13/2023   11:19 AM 07/12/2023   11:48 AM  Readmission Risk Prevention Plan  Post Dischage Appt Complete Complete  Medication Screening Complete Complete  Transportation Screening Complete Complete

## 2023-07-13 NOTE — Progress Notes (Signed)

## 2023-07-13 NOTE — Progress Notes (Signed)
 PROGRESS NOTE    Willie Steele  ZOX:[id number] DOB: 03/15/1981 DOA: 07/09/2023 PCP: Tracey Harries, MD  Subjective: Pt seen and examined. Remains on PO Zyvox.  Finally had BM yesterday No fevers.  Long discussion with pt. No abscess to drain. No further benefit in staying in the hospital longer since he is already on po abx.  Outpatient referral to ID for suggestions on how to prevent further occurrences of cellulitis.  Pt with thick and hyperkeratotic skin on both legs. Lymphedema of both legs. Pt is trying to lose weight but states his health insurance in prohibitive in getting weight loss drugs.   Hospital Course: HPI: Willie Steele is a 43 y.o. male with medical history significant for chronic diastolic congestive heart failure, hypertension, chronic atrial flutter, super morbid obesity being admitted to the hospital with recurrent right lower extremity cellulitis.  He gets cellulitis about once per year, he was admitted to this facility in November 2024, treated with Zyvox successfully.  Since that time he was admitted with atrial flutter.  He has been doing well, about 5 days ago he noticed redness and pain in his right lower extremity.  No open wound or injury as far as he can recall.  Started p.o. doxycycline ordered by urgent care.  Initially seem to help, but today he had increased swelling, redness and pain.  Denies any fevers, chills, cough, shortness of breath or other complication.   Significant Events: Admitted 07/09/2023 for right leg cellulitis   Significant Labs: WBC 13.2, HgB 14, Plt 298 Na 139, K 3.6, CO2 31, BUN 24, Scr 0.7, glu 107 BNP 108  Significant Imaging Studies:   Antibiotic Therapy: Anti-infectives (From admission, onward)    Start     Dose/Rate Route Frequency Ordered Stop   07/09/23 1500  linezolid (ZYVOX) IVPB 600 mg        600 mg 300 mL/hr over 60 Minutes Intravenous Every 12 hours 07/09/23 1448         Procedures:   Consultants:      Assessment and Plan: * Cellulitis of right leg 07-10-2023 continue with IV Zyvox. He had admission in 02-2023 where he responded to zyvox and was discharged home. Check ASO titer. Increase IV morphine prn to 4 mg q4h prn. Continue IV lasix for diuresis  07-11-2023 continue with po zyvox. Continue with prn IV morphine/oxycodone. Add nystatin powder to scrotal area. Less erythema on right inner thigh. No tenderness in perineal area/perinuem/scrotum. Doubt Fournier pathology.   07-12-2023 remains on PO zyvox. Change to po lasix. Stop IV lasix. BMP showing signs of contraction alkalosis with CO2 up to 34. On admission, serum CO2 was 31. Pt is compliant with wearing his Bipap.  Plan for DC in AM.  Home health PT and Nurse aide ordered. DME wide walker ordered. Pt declines need for [facility].  07-13-2023 remains afebrile. Tolerating po abx now for 3-4 days now. No indicating for further inpatient hospitalization. Pt with lymphedema of both legs and hyperkeratotic skin. Will DC to home to complete 10 days of po zyvox. Prn oxycodone. Will refer to outpatient ID to see if they have any other suggestions for him to prevent further occurrences of cellulitis. Pt has been diuresed about 12 liters since admission. Weight 727 lbs --->700 lbs at discharge.  At discharge, this is what his right leg/thigh looked like.          Chronic anticoagulation - on Xarelto for atrial flutter 07-10-2023 Xarelto started by Afib clinic despite his massive obesity.  Continue Xarelto.  07-11-2023 continue xarelto.  07-12-2023 stable. continue Xarelto.  07-13-2023 stable  Atrial flutter (HCC) 07-10-2023 move to telemetry floor due to his hx of atrial flutter. Continue cardizem CD and xarelto.  07-11-2023 stable. Chronic. continue cardizem CD and Xarelto.  07-12-2023 stable. Chronic. continue cardizem CD and Xarelto.  07-13-2023 stable  OSA treated with BiPAP 07-10-2023 continue bipap  07-11-2023 stable on  bipap  07-12-2023 stable on bipap.  07-13-2023 stable  Super obesity Body mass index is 87.49 kg/m.   Chronic diastolic heart failure (HCC) 07-10-2023 not exacerbated. IV lasix for his edema in his cellulitic leg, not for CHF exacerbation.  07-11-2023 awaiting AM labs.  07-12-2023 Change to po lasix. Stop IV lasix. BMP showing signs of contraction alkalosis with CO2 up to 34. On admission, serum CO2 was 31. Pt did not have any pulmonary edema. IV lasix was for his edematous legs from his cellulitis. He did not have any acute CHF exacerbation.  07-13-2023 stable. DC to home on his demadex 40 mg qday. Admit weight 727. Discharge weight 700. Diuresed off about 12 liters. All the fluid was in his legs.  DVT prophylaxis:  rivaroxaban (XARELTO) tablet 20 mg     Code Status: Full Code Family Communication: no family at bedside. Pt is decisional. Disposition Plan: return home Reason for continuing need for hospitalization: stable for DC  Objective: Vitals:   07/12/23 1241 07/12/23 1956 07/12/23 2008 07/13/23 0441  BP: (!) 155/76 (!) 142/85  (!) 159/84  Pulse: 93 88 87 99  Resp: 18 19 20 18   Temp: 98.4 F (36.9 C) 97.7 F (36.5 C)  98.2 F (36.8 C)  TempSrc: Oral Oral    SpO2: 94% 98% 97% 94%  Weight:      Height:        Intake/Output Summary (Last 24 hours) at 07/13/2023 0949 Last data filed at 07/12/2023 1800 Gross per 24 hour  Intake 600 ml  Output 1200 ml  Net -600 ml   Filed Weights   07/09/23 1303 07/09/23 1708  Weight: (!) 329.9 kg (!) 317.5 kg    Examination:  Physical Exam Vitals and nursing note reviewed.  Constitutional:      General: He is not in acute distress.    Appearance: He is obese. He is not toxic-appearing or diaphoretic.  HENT:     Head: Normocephalic and atraumatic.  Abdominal:     General: Abdomen is protuberant.  Musculoskeletal:     Comments: Erythema of right lower leg is a dark purple-ish color now. No longer having bright red acute  appearing color. Less edema since admission.  Skin:    General: Skin is warm and dry.     Capillary Refill: Capillary refill takes less than 2 seconds.  Neurological:     Mental Status: He is alert and oriented to person, place, and time.     Data Reviewed: I have personally reviewed following labs and imaging studies  CBC: Recent Labs  Lab 07/09/23 1451 07/10/23 0356 07/11/23 1214 07/13/23 0542  WBC 13.2* 9.7 10.7* 8.8  NEUTROABS 10.5*  --  8.3* 5.8  HGB 14.0 12.1* 14.5 14.3  HCT 46.8 38.1* 46.8 46.8  MCV 102.4* 98.4 100.2* 100.2*  PLT 298 220 244 230   Basic Metabolic Panel: Recent Labs  Lab 07/09/23 1451 07/10/23 0356 07/11/23 1214 07/12/23 0839 07/13/23 0542  NA 139 136 137 135 135  K 3.6 3.9 4.0 4.8 4.3  CL 98 99 93* 92* 94*  CO2  31 29 31  34* 30  GLUCOSE 107* 112* 104* 108* 110*  BUN 24* 22* 15 17 18   CREATININE 0.70 0.69 0.70 0.70 0.79  CALCIUM 8.8* 8.2* 8.9 9.3 9.0  MG  --   --  1.9 2.2 2.2   GFR: Estimated Creatinine Clearance: 302.3 mL/min (by C-G formula based on SCr of 0.79 mg/dL). Liver Function Tests: Recent Labs  Lab 07/09/23 1451 07/11/23 1214  AST 33 22  ALT 63* 38  ALKPHOS 53 53  BILITOT 1.0 1.3*  PROT 7.3 7.0  ALBUMIN 3.4* 2.8*   BNP (last 3 results) Recent Labs    03/04/23 1135 03/25/23 0907 07/09/23 1451  BNP 95.7 73.0 108.1*   Scheduled Meds:  bisacodyl  10 mg Oral Daily   diltiazem  360 mg Oral Daily   linezolid  600 mg Oral Q12H   melatonin  10 mg Oral QHS   metoprolol tartrate  25 mg Oral BID   nystatin   Topical TID   polyethylene glycol  34 g Oral Daily   potassium chloride SA  40 mEq Oral BID   rivaroxaban  20 mg Oral Q supper   torsemide  40 mg Oral Daily   Continuous Infusions:   LOS: 4 days   Time spent: 40 minutes  Carollee Herter, DO  [facility]  07/13/2023, 9:49 AM

## 2023-07-13 NOTE — Discharge Summary (Signed)
 [facility] Phys[facility] Discharge Summary   Patient name: Willie Steele  Admit date:     07/09/2023  Discharge date: 07/13/2023  Attending Physician: Maryln Gottron [[id number]]  Discharge Physician: Carollee Herter   PCP: Tracey Harries, MD  Admitted From: Home Disposition:  Home  Recommendations for Outpatient Follow-up:  Follow up with PCP in 1-2 weeks Ambulatory referral to infectious disease on how to prevent recurrences of cellulitis  Home Health:Yes. PT/RN/nurse aide Equipment/Devices: None    Discharge Condition:Stable CODE STATUS:FULL Diet recommendation: Heart Healthy Fluid Restriction: None  Hospital Summary: HPI: Madyx Delfin is a 43 y.o. male with medical history significant for chronic diastolic congestive heart failure, hypertension, chronic atrial flutter, super morbid obesity being admitted to the hospital with recurrent right lower extremity cellulitis.  He gets cellulitis about once per year, he was admitted to this facility in November 2024, treated with Zyvox successfully.  Since that time he was admitted with atrial flutter.  He has been doing well, about 5 days ago he noticed redness and pain in his right lower extremity.  No open wound or injury as far as he can recall.  Started p.o. doxycycline ordered by urgent care.  Initially seem to help, but today he had increased swelling, redness and pain.  Denies any fevers, chills, cough, shortness of breath or other complication.   Significant Events: Admitted 07/09/2023 for right leg cellulitis   Significant Labs: WBC 13.2, HgB 14, Plt 298 Na 139, K 3.6, CO2 31, BUN 24, Scr 0.7, glu 107 BNP 108  Significant Imaging Studies:   Antibiotic Therapy: Anti-infectives (From admission, onward)    Start     Dose/Rate Route Frequency Ordered Stop   07/09/23 1500  linezolid (ZYVOX) IVPB 600 mg        600 mg 300 mL/hr over 60 Minutes Intravenous Every 12 hours 07/09/23 1448          Procedures:   Consultants:    Hospital Course by Problem: * Cellulitis of right leg 07-10-2023 continue with IV Zyvox. He had admission in 02-2023 where he responded to zyvox and was discharged home. Check ASO titer. Increase IV morphine prn to 4 mg q4h prn. Continue IV lasix for diuresis  07-11-2023 continue with po zyvox. Continue with prn IV morphine/oxycodone. Add nystatin powder to scrotal area. Less erythema on right inner thigh. No tenderness in perineal area/perinuem/scrotum. Doubt Fournier pathology.   07-12-2023 remains on PO zyvox. Change to po lasix. Stop IV lasix. BMP showing signs of contraction alkalosis with CO2 up to 34. On admission, serum CO2 was 31. Pt is compliant with wearing his Bipap.  Plan for DC in AM.  Home health PT and Nurse aide ordered. DME wide walker ordered. Pt declines need for [facility].  07-13-2023 remains afebrile. Tolerating po abx now for 3-4 days now. No indicating for further inpatient hospitalization. Pt with lymphedema of both legs and hyperkeratotic skin. Will DC to home to complete 10 days of po zyvox. Prn oxycodone. Will refer to outpatient ID to see if they have any other suggestions for him to prevent further occurrences of cellulitis. Pt has been diuresed about 12 liters since admission. Weight 727 lbs --->700 lbs at discharge.  At discharge, this is what his right leg/thigh looked like.          On admission, this is what his right leg looked like.          Chronic anticoagulation - on Xarelto for atrial flutter 07-10-2023 Xarelto started by  Afib clinic despite his massive obesity. Continue Xarelto.  07-11-2023 continue xarelto.  07-12-2023 stable. continue Xarelto.  07-13-2023 stable  Atrial flutter (HCC) 07-10-2023 move to telemetry floor due to his hx of atrial flutter. Continue cardizem CD and xarelto.  07-11-2023 stable. Chronic. continue cardizem CD and Xarelto.  07-12-2023 stable. Chronic. continue cardizem  CD and Xarelto.  07-13-2023 stable  OSA treated with BiPAP 07-10-2023 continue bipap  07-11-2023 stable on bipap  07-12-2023 stable on bipap.  07-13-2023 stable  Super obesity Body mass index is 87.49 kg/m.   Chronic diastolic heart failure (HCC) 07-10-2023 not exacerbated. IV lasix for his edema in his cellulitic leg, not for CHF exacerbation.  07-11-2023 awaiting AM labs.  07-12-2023 Change to po lasix. Stop IV lasix. BMP showing signs of contraction alkalosis with CO2 up to 34. On admission, serum CO2 was 31. Pt did not have any pulmonary edema. IV lasix was for his edematous legs from his cellulitis. He did not have any acute CHF exacerbation.  07-13-2023 stable. DC to home on his demadex 40 mg qday. Admit weight 727. Discharge weight 700. Diuresed off about 12 liters. All the fluid was in his legs.    Discharge Diagnoses:  Principal Problem:   Cellulitis of right leg Active Problems:   Super obesity   OSA treated with BiPAP   Atrial flutter (HCC)   Chronic anticoagulation - on Xarelto for atrial flutter   Chronic diastolic heart failure [facility])   Discharge Instructions  Discharge Instructions     Ambulatory referral to Infectious Disease   Complete by: As directed    Recurrent right leg cellulitis.   Call MD for:  difficulty breathing, headache or visual disturbances   Complete by: As directed    Call MD for:  extreme fatigue   Complete by: As directed    Call MD for:  hives   Complete by: As directed    Call MD for:  persistant dizziness or light-headedness   Complete by: As directed    Call MD for:  persistant nausea and vomiting   Complete by: As directed    Call MD for:  severe uncontrolled pain   Complete by: As directed    Call MD for:  temperature >100.4   Complete by: As directed    Diet - low sodium heart healthy   Complete by: As directed    Discharge instructions   Complete by: As directed    1. Follow up with your primary care provider  in 1-2 weeks following discharge from hospital. 2. Ambulatory referral made to Infectious Disease for your to evaluate for recurrent cellulitis. Office will contact you with appointment.   Increase activity slowly   Complete by: As directed       Allergies as of 07/13/2023       Reactions   Sulfa Antibiotics Hives, Rash        Medication List     TAKE these medications    acetaminophen 500 MG tablet Commonly known as: TYLENOL Take 1,500 mg by mouth every 8 (eight) hours as needed for moderate pain (pain score 4-6).   Chlorhexidine Gluconate 2 % Liqd Wash with chlorhexidine soap once a day   diltiazem 360 MG 24 hr capsule Commonly known as: CARDIZEM CD Take 1 capsule (360 mg total) by mouth daily.   linezolid 600 MG tablet Commonly known as: ZYVOX Take 1 tablet (600 mg total) by mouth every 12 (twelve) hours for 6 days.   Melatonin 10 MG  Tabs Take 10 mg by mouth at bedtime.   methylcellulose oral powder Take 1 packet by mouth daily. 30 ml in coffee or water   metoprolol tartrate 25 MG tablet Commonly known as: LOPRESSOR Take 1 tablet (25 mg total) by mouth 2 (two) times daily.   Oxycodone HCl 10 MG Tabs Take 1 tablet (10 mg total) by mouth every 6 (six) hours as needed for moderate pain (pain score 4-6) or severe pain (pain score 7-10).   potassium chloride SA 20 MEQ tablet Commonly known as: KLOR-CON M Take 1 tablet (20 mEq total) by mouth 2 (two) times daily.   rivaroxaban 20 MG Tabs tablet Commonly known as: XARELTO Take 1 tablet (20 mg total) by mouth daily with supper. Appointment Required For Further Refills [phone]   torsemide 20 MG tablet Commonly known as: DEMADEX Take 2 tablets (40 mg total) by mouth daily.               Durable Medical Equipment  (From admission, onward)           Start     Ordered   07/12/23 0755  For home use only DME Walker wide  Once       Question:  Patient needs a walker to treat with the following  condition  Answer:  Debility   07/12/23 0754            Allergies  Allergen Reactions   Sulfa Antibiotics Hives and Rash    Discharge Exam: Vitals:   07/12/23 2008 07/13/23 0441  BP:  (!) 159/84  Pulse: 87 99  Resp: 20 18  Temp:  98.2 F (36.8 C)  SpO2: 97% 94%    Physical Exam Vitals and nursing note reviewed.  Constitutional:      General: He is not in acute distress.    Appearance: He is obese. He is not toxic-appearing or diaphoretic.  HENT:     Head: Normocephalic and atraumatic.  Abdominal:     General: Abdomen is protuberant.  Musculoskeletal:     Comments: Erythema of right lower leg is a dark purple-ish color now. No longer having bright red acute appearing color. Less edema since admission.  Skin:    General: Skin is warm and dry.     Capillary Refill: Capillary refill takes less than 2 seconds.  Neurological:     Mental Status: He is alert and oriented to person, place, and time.            The results of significant diagnostics from this hospitalization (including imaging, microbiology, ancillary and laboratory) are listed below for reference.    Labs: BNP (last 3 results) Recent Labs    03/04/23 1135 03/25/23 0907 07/09/23 1451  BNP 95.7 73.0 108.1*   Basic Metabolic Panel: Recent Labs  Lab 07/09/23 1451 07/10/23 0356 07/11/23 1214 07/12/23 0839 07/13/23 0542  NA 139 136 137 135 135  K 3.6 3.9 4.0 4.8 4.3  CL 98 99 93* 92* 94*  CO2 31 29 31  34* 30  GLUCOSE 107* 112* 104* 108* 110*  BUN 24* 22* 15 17 18   CREATININE 0.70 0.69 0.70 0.70 0.79  CALCIUM 8.8* 8.2* 8.9 9.3 9.0  MG  --   --  1.9 2.2 2.2   Liver Function Tests: Recent Labs  Lab 07/09/23 1451 07/11/23 1214  AST 33 22  ALT 63* 38  ALKPHOS 53 53  BILITOT 1.0 1.3*  PROT 7.3 7.0  ALBUMIN 3.4* 2.8*   CBC: Recent Labs  Lab 07/09/23 1451 07/10/23 0356 07/11/23 1214 07/13/23 0542  WBC 13.2* 9.7 10.7* 8.8  NEUTROABS 10.5*  --  8.3* 5.8  HGB 14.0 12.1* 14.5  14.3  HCT 46.8 38.1* 46.8 46.8  MCV 102.4* 98.4 100.2* 100.2*  PLT 298 220 244 230   BNP: Recent Labs  Lab 07/09/23 1451  BNP 108.1*   Urinalysis    Component Value Date/Time   BILIRUBINUR negative 12/21/2012 1650   KETONESUR negative 12/21/2012 1650   PROTEINUR negative 12/21/2012 1650   UROBILINOGEN 0.2 12/21/2012 1650   NITRITE Negative 12/21/2012 1650   LEUKOCYTESUR Negative 12/21/2012 1650   Sepsis Labs Recent Labs  Lab 07/09/23 1451 07/10/23 0356 07/11/23 1214 07/13/23 0542  WBC 13.2* 9.7 10.7* 8.8   ASO Titer Lab Results  Component Value Date/Time   ASO 29.0 07/10/2023 11:06 AM   Procedures/Studies: DG Tibia/Fibula Right Port Result Date: 07/09/2023 CLINICAL DATA:  Right leg pain for several days. EXAM: PORTABLE RIGHT TIBIA AND FIBULA - 2 VIEW COMPARISON:  None Available. FINDINGS: There is no evidence of fracture or other focal bone lesions. Soft tissues are unremarkable. IMPRESSION: Negative. Electronically Signed   By: Lupita Raider M.D.   On: 07/09/2023 16:01   [place] RT LOWER EXTREM LTD SOFT TISSUE NON VASCULAR Result Date: 07/09/2023 CLINICAL DATA:  Concern for abscess in the right lower extremity EXAM: ULTRASOUND RIGHT LOWER EXTREMITY LIMITED TECHNIQUE: Ultrasound examination of the lower extremity soft tissues was performed in the area of clinical concern. COMPARISON:  None Available. FINDINGS: Ultrasound performed in the right anterior calf in the area concern. Soft tissue/subcutaneous edema noted in the area concern. No focal fluid collection to suggest abscess. IMPRESSION: Edema noted in the area concern.  No focal fluid collection/abscess. Electronically Signed   By: Charlett Nose M.D.   On: 07/09/2023 14:07    Time coordinating discharge: 50 mins  SIGNED:  Carollee Herter, DO [facility] 07/13/23, 9:53 AM

## 2023-07-14 NOTE — Progress Notes (Deleted)
 Cardiology Office Note:    Date:  07/14/2023   ID:  [name], DOB 1981-04-02, MRN [id number]  PCP:  Tracey Harries, MD  Cardiologist:  Little Ishikawa, MD  Electrophysiologist:  None   Referring MD: Tracey Harries, MD   No chief complaint on file. ***  History of Present Illness:    Willie Steele is a 43 y.o. male with a hx of morbid obesity, OSA, diastolic heart failure, atrial fibrillation who presents for follow-up.  He was admitted 05/2021 with acute diastolic heart failure.  Echocardiogram nondiagnostic due to limited windows due to body habitus but appeared grossly normal LV systolic function.  He was diuresed almost 70 pounds of fluid.  He was admitted in 02/2023 with acute on chronic diastolic heart failure and sepsis due to cellulitis.  Echocardiogram 02/2023 nondiagnostic.  He was diuresed 26 L of fluid that admission.  He was admitted 03/2023 with new onset atrial flutter with RVR.  Echocardiogram at that time again with limited windows but LVEF appeared grossly normal.  He was started on Xarelto and after completing 3 weeks of uninterrupted anticoagulation underwent successful cardioversion on 05/01/2023.  Past Medical History:  Diagnosis Date   Abscess of right thigh    Chronic diastolic (congestive) heart failure (HCC)    Hypertension    Morbid obesity (HCC)     Past Surgical History:  Procedure Laterality Date   CARDIOVERSION N/A 05/01/2023   Procedure: CARDIOVERSION;  Surgeon: Pricilla Riffle, MD;  Location: [facility] INVASIVE CV LAB;  Service: Cardiovascular;  Laterality: N/A;    Current Medications: No outpatient medications have been marked as taking for the 07/17/23 encounter (Appointment) with Little Ishikawa, MD.     Allergies:   Sulfa antibiotics   Social History   Socioeconomic History   Marital status: Single    Spouse name: Not on file   Number of children: Not on file   Years of education: Not on file   Highest education level: Not on file   Occupational History   Not on file  Tobacco Use   Smoking status: Former    Current packs/day: 0.00    Types: Cigarettes    Quit date: 04/13/2011    Years since quitting: 12.2   Smokeless tobacco: Never   Tobacco comments:    Former smoker 04/09/23  Substance and Sexual Activity   Alcohol use: Yes    Comment: every few weeks   Drug use: No   Sexual activity: Not on file  Other Topics Concern   Not on file  Social History Narrative   Not on file   Social Drivers of Health   Financial Resource Strain: Low Risk  (07/06/2023)   Received from [facility]   Overall Financial Resource Strain (CARDIA)    Difficulty of Paying Living Expenses: Not very hard  Food Insecurity: No Food Insecurity (07/09/2023)   Hunger Vital Sign    Worried About Running Out of Food in the Last Year: Never true    Ran Out of Food in the Last Year: Never true  Transportation Needs: No Transportation Needs (07/09/2023)   PRAPARE - Administrator, Civil Service (Medical): No    Lack of Transportation (Non-Medical): No  Physical Activity: Unknown (07/06/2023)   Received from [facility]   Exercise Vital Sign    Days of Exercise per Week: 0 days    Minutes of Exercise per Session: Not on file  Stress: Stress Concern Present (07/06/2023)   Received from  [facility]   [employer] of Occupational Health - Occupational Stress Questionnaire    Feeling of Stress : To some extent  Social Connections: Somewhat Isolated (07/06/2023)   Received from [facility]   Social Network    How would you rate your social network (family, work, friends)?: Restricted participation with some degree of social isolation     Family History: The patient's ***family history includes Bowel Disease in his father.  ROS:   Please see the history of present illness.    *** All other systems reviewed and are negative.  EKGs/Labs/Other Studies Reviewed:    The following studies were reviewed  today: ***  EKG:  EKG is *** ordered today.  The ekg ordered today demonstrates ***  Recent Labs: 03/25/2023: TSH 1.421 07/09/2023: B Natriuretic Peptide 108.1 07/11/2023: ALT 38 07/13/2023: BUN 18; Creatinine, Ser 0.79; Hemoglobin 14.3; Magnesium 2.2; Platelets 230; Potassium 4.3; Sodium 135  Recent Lipid Panel    Component Value Date/Time   CHOL 126 05/30/2021 0326   TRIG 85 05/30/2021 0326   HDL 25 (L) 05/30/2021 0326   CHOLHDL 5.0 05/30/2021 0326   VLDL 17 05/30/2021 0326   LDLCALC 84 05/30/2021 0326    Physical Exam:    VS:  There were no vitals taken for this visit.    Wt Readings from Last 3 Encounters:  07/09/23 (!) 700 lb (317.5 kg)  05/08/23 (!) 700 lb (317.5 kg)  04/09/23 (!) 642 lb (291.2 kg)     GEN: *** Well nourished, well developed in no acute distress HEENT: Normal NECK: No JVD; No carotid bruits LYMPHATICS: No lymphadenopathy CARDIAC: ***RRR, no murmurs, rubs, gallops RESPIRATORY:  Clear to auscultation without rales, wheezing or rhonchi  ABDOMEN: Soft, non-tender, non-distended MUSCULOSKELETAL:  No edema; No deformity  SKIN: Warm and dry NEUROLOGIC:  Alert and oriented x 3 PSYCHIATRIC:  Normal affect   ASSESSMENT:    No diagnosis found. PLAN:    Atrial flutter: He was admitted 03/2023 with new onset atrial flutter with RVR.  Echocardiogram at that time again with limited windows but LVEF appeared grossly normal.  He was started on Xarelto and after completing 3 weeks of uninterrupted anticoagulation underwent successful cardioversion on 05/01/2023. -Continue Xarelto 20 mg daily.  CHA2DS2-VASc 2 (CHF, hypertension) -Continue diltiazem 360 mg daily and metoprolol 25 mg twice daily  Chronic diastolic heart failure: Echo with limited windows but grossly normal LV systolic function on 03/2023.  Admitted with acute diastolic heart failure 05/2021, was diuresed almost 70 pounds of fluid.  Admitted 02/2023 with acute diastolic heart failure, diuresed 26 L of  fluid -Continue torsemide 20 mg daily  Hypertension: On diltiazem and metoprolol  Morbid obesity: There is no height or weight on file to calculate BMI.  Referred to healthy weight and wellness***  OSA: On CPAP  RTC in***   Medication Adjustments/Labs and Tests Ordered: Current medicines are reviewed at length with the patient today.  Concerns regarding medicines are outlined above.  No orders of the defined types were placed in this encounter.  No orders of the defined types were placed in this encounter.   There are no Patient Instructions on file for this visit.   Signed, Little Ishikawa, MD  07/14/2023 9:29 PM    [facility]

## 2023-07-17 ENCOUNTER — Ambulatory Visit: Payer: 59 | Admitting: Cardiology

## 2023-07-20 ENCOUNTER — Ambulatory Visit: Admitting: Orthopaedic Surgery

## 2023-07-28 ENCOUNTER — Other Ambulatory Visit (HOSPITAL_COMMUNITY): Payer: Self-pay | Admitting: Internal Medicine

## 2023-08-03 ENCOUNTER — Other Ambulatory Visit (INDEPENDENT_AMBULATORY_CARE_PROVIDER_SITE_OTHER)

## 2023-08-03 ENCOUNTER — Ambulatory Visit (INDEPENDENT_AMBULATORY_CARE_PROVIDER_SITE_OTHER): Admitting: Physician Assistant

## 2023-08-03 ENCOUNTER — Telehealth: Payer: Self-pay | Admitting: Radiology

## 2023-08-03 DIAGNOSIS — M1712 Unilateral primary osteoarthritis, left knee: Secondary | ICD-10-CM

## 2023-08-03 DIAGNOSIS — M1711 Unilateral primary osteoarthritis, right knee: Secondary | ICD-10-CM

## 2023-08-03 NOTE — Progress Notes (Signed)
 Office Visit Note   Patient: Willie Steele           Date of Birth: [birth year]           MRN: [id number] Visit Date: 08/03/2023              Requested by: Alfredia Ina, MD [address] [place],  [place] [address] PCP: Alfredia Ina, MD   Assessment & Plan: Visit Diagnoses:  1. Primary osteoarthritis of left knee   2. Arthritis of knee, right     Plan: Patient is unable to take NSAIDs due to his chronic anticoagulation.  Would not recommend cortisone injection given his cellulitis of both legs and recent past requiring admission.  Due to his weight he is not a surgical candidate.  Therefore would recommend viscosupplementation both knees.  Will try to gain approval and then have him back once this is available.  Follow-Up Instructions: Return for Supplemental injection.   Orders:  Orders Placed This Encounter  Procedures   XR Knee 1-2 Views Left   No orders of the defined types were placed in this encounter.     Procedures: No procedures performed   Clinical Data: No additional findings.   Subjective: Chief Complaint  Patient presents with   Left Knee - Pain    HPI Willie Steele is well-known to Dr. Alvester Johnson service comes in today with left knee pain.  Has had left knee pain for the past month and a half.  Pain is mostly medial aspect of the knee.  No new injuries.  He uses a cane to ambulate. Patient admitted in November 2024 due to sepsis secondary to lower leg cellulitis.  Then admitted for right leg cellulitis in March and was discharged March 31.  Review of Systems  Constitutional:  Negative for chills and fever.     Objective: Vital Signs: There were no vitals taken for this visit.  Physical Exam Constitutional:      Appearance: He is not ill-appearing.  Pulmonary:     Effort: Pulmonary effort is normal.  Neurological:     Mental Status: He is alert.  Psychiatric:        Mood and Affect: Mood normal.     Ortho Exam Bilateral  lower extremities: Chronic brawny changes lower extremities no impending ulcers.  No significant erythema consistent with cellulitis. Bilateral knees: Varus malalignment bilaterally.  Pat patellofemoral crepitus as passive range of motion.  No gross instability valgus varus stressing. Specialty Comments:  No specialty comments available.  Imaging: XR Knee 1-2 Views Left Result Date: 08/03/2023 Left knee 2 views: Knee is well located.  Tricompartmental arthritis with moderate to moderately severe medial joint line narrowing.  Lateral joint line mild to moderate changes with osteophytes off lateral margin.  Patellofemoral joint with moderate arthritic changes.  No acute fractures.    PMFS History: Patient Active Problem List   Diagnosis Date Noted   Chronic anticoagulation - on Xarelto  for atrial flutter 07/10/2023   Cellulitis of right leg 07/09/2023   Atrial flutter (HCC) 03/25/2023   Chronic diastolic heart failure (HCC) 07/08/2021   OSA treated with BiPAP 06/02/2021   Prediabetes 06/02/2021   Cardiomegaly 05/29/2021   Hypertriglyceridemia 08/30/2020   Metabolic syndrome 08/30/2020   Hypertension, benign 08/03/2012   Super obesity 08/03/2012   Vitamin D deficiency 04/19/2012   Past Medical History:  Diagnosis Date   Abscess of right thigh    Chronic diastolic (congestive) heart failure (HCC)    Hypertension  Morbid obesity (HCC)     Family History  Problem Relation Age of Onset   Bowel Disease Father        colon blockage    Past Surgical History:  Procedure Laterality Date   CARDIOVERSION N/A 05/01/2023   Procedure: CARDIOVERSION;  Surgeon: Elmyra Haggard, MD;  Location: [facility] INVASIVE CV LAB;  Service: Cardiovascular;  Laterality: N/A;   Social History   Occupational History   Not on file  Tobacco Use   Smoking status: Former    Current packs/day: 0.00    Types: Cigarettes    Quit date: 04/13/2011    Years since quitting: 12.3   Smokeless tobacco: Never    Tobacco comments:    Former smoker 04/09/23  Substance and Sexual Activity   Alcohol use: Yes    Comment: every few weeks   Drug use: No   Sexual activity: Not on file

## 2023-08-03 NOTE — Telephone Encounter (Signed)
 Left knee gel injection  Previously submitted for right knee gel, please schedule both at same time if possible.  Thanks

## 2023-08-04 NOTE — Telephone Encounter (Signed)
 VOB submitted for Orthovisc, left knee Previously approved for Orthovisc, right knee

## 2023-08-06 ENCOUNTER — Ambulatory Visit: Admitting: Internal Medicine

## 2023-08-10 ENCOUNTER — Ambulatory Visit: Admitting: Physician Assistant

## 2023-08-19 ENCOUNTER — Telehealth: Payer: Self-pay

## 2023-08-19 DIAGNOSIS — M1712 Unilateral primary osteoarthritis, left knee: Secondary | ICD-10-CM

## 2023-08-19 NOTE — Telephone Encounter (Signed)
 Called and left a VM for patient to CB to schedule for gel injection with Dr. Lucienne Ryder or Dwyane Glad.  Appt. Will be for Orthovisc, bilateral knee.

## 2023-08-22 ENCOUNTER — Other Ambulatory Visit (HOSPITAL_COMMUNITY): Payer: Self-pay | Admitting: Internal Medicine

## 2023-08-24 ENCOUNTER — Other Ambulatory Visit (HOSPITAL_COMMUNITY): Payer: Self-pay | Admitting: Internal Medicine

## 2023-08-24 NOTE — Telephone Encounter (Signed)
 Dr. Lavern Potash pt. This RX was prescribed in the a-fib clinic. Does Dr. Alda Amas want to refill? Please advise.

## 2023-08-24 NOTE — Telephone Encounter (Signed)
 Prescription refill request for Xarelto  received.  Indication:aflutter Last office visit:1/25 Weight:317.5  kg Age:43 Scr:0.79  3/25 CrCl:547.03  ml/min  Prescription refilled

## 2023-08-27 ENCOUNTER — Ambulatory Visit: Admitting: Infectious Diseases

## 2023-08-28 ENCOUNTER — Other Ambulatory Visit: Payer: Self-pay | Admitting: Physician Assistant

## 2023-08-28 MED ORDER — TRAMADOL HCL 50 MG PO TABS: 50.0000 mg | ORAL_TABLET | Freq: Four times a day (QID) | ORAL | 0 refills | Status: DC | PRN

## 2023-08-31 ENCOUNTER — Ambulatory Visit: Attending: Nurse Practitioner | Admitting: Nurse Practitioner

## 2023-08-31 NOTE — Progress Notes (Deleted)
 Office Visit    Patient Name: Willie Steele Date of Encounter: 08/31/2023  Primary Care Provider:  Alfredia Ina, MD Primary Cardiologist:  Wendie Hamburg, MD  Chief Complaint    43 year old male with a history of chronic diastolic heart failure, atrial flutter, hypertension, prediabetes, OSA, and super obesity who presents for follow-up related to heart failure.  Past Medical History    Past Medical History:  Diagnosis Date   Abscess of right thigh    Chronic diastolic (congestive) heart failure (HCC)    Hypertension    Morbid obesity (HCC)    Past Surgical History:  Procedure Laterality Date   CARDIOVERSION N/A 05/01/2023   Procedure: CARDIOVERSION;  Surgeon: Elmyra Haggard, MD;  Location: [facility] INVASIVE CV LAB;  Service: Cardiovascular;  Laterality: N/A;    Allergies  Allergies  Allergen Reactions   Sulfa Antibiotics Hives and Rash     Labs/Other Studies Reviewed    The following studies were reviewed today:  Cardiac Studies & Procedures   ______________________________________________________________________________________________     ECHOCARDIOGRAM  ECHOCARDIOGRAM LIMITED 03/26/2023  Narrative ECHOCARDIOGRAM LIMITED REPORT    Patient Name:   Willie Steele Date of Exam: 03/26/2023 Medical Rec #:  [id number]     Height:       74.0 in Accession #:    [id number]    Weight:       650.0 lb Date of Birth:  August 02, 1980     BSA:          3.587 m Patient Age:    42 years      BP:           174/128 mmHg Patient Gender: M             HR:           90 bpm. Exam Location:  Inpatient  Procedure: 2D Echo and Intracardiac Opacification Agent  Indications:    Aflutter/ Function  History:        Patient has prior history of Echocardiogram examinations, most recent 03/05/2023.  Sonographer:    Hersey Lorenzo RDCS Referring Phys: [id number] CHRISTOPHER L SCHUMANN  IMPRESSIONS   1. Limited echo for function with echo contrast. Even with contrast, acoustic  windows are very limited. Apical windows show normal LVEF without apparent wall motion abnormalities. Left ventricular ejection fraction, by estimation, is 60 to 65%. The left ventricle has normal function. The left ventricle has no regional wall motion abnormalities.  FINDINGS Left Ventricle: Limited echo for function with echo contrast. Even with contrast, acoustic windows are very limited. Apical windows show normal LVEF without apparent wall motion abnormalities. Left ventricular ejection fraction, by estimation, is 60 to 65%. The left ventricle has normal function. The left ventricle has no regional wall motion abnormalities. Definity  contrast agent was given IV to delineate the left ventricular endocardial borders.  Sheryle Donning MD Electronically signed by Sheryle Donning MD Signature Date/Time: 03/26/2023/7:15:08 PM    Final          ______________________________________________________________________________________________     Recent Labs: 03/25/2023: TSH 1.421 07/09/2023: B Natriuretic Peptide 108.1 07/11/2023: ALT 38 07/13/2023: BUN 18; Creatinine, Ser 0.79; Hemoglobin 14.3; Magnesium  2.2; Platelets 230; Potassium 4.3; Sodium 135  Recent Lipid Panel    Component Value Date/Time   CHOL 126 05/30/2021 0326   TRIG 85 05/30/2021 0326   HDL 25 (L) 05/30/2021 0326   CHOLHDL 5.0 05/30/2021 0326   VLDL 17 05/30/2021 0326   LDLCALC 84 05/30/2021 0326    History  of Present Illness    43 year old male with the above past medical history including chronic diastolic heart failure, atrial flutter, hypertension, prediabetes, OSA, and super obesity.  He was hospitalized in 05/2021 in the setting of acute diastolic heart failure.  Echocardiogram was nondiagnostic due to very poor echo windows and visualization of cardiac structures, however, based on limited views, EF appeared to be within normal range, EF 55 to 65%.  He was transition from furosemide  to torsemide .   He was hospitalized in 02/2023 in the setting of sepsis due to cellulitis, acute hypoxic respiratory failure, acute on chronic diastolic heart failure.  Repeat echocardiogram unable to assess LVEF or wall motion due to inability to visualize endocardial segments from poor acoustical windows, unable to optimally evaluate regional wall motion.  He was hospitalized in December 2024 in the setting of new onset atrial flutter with RVR.  Cardiology was consulted.  Repeat limited echo showed acoustic windows limited due to body habitus, LVEF 60 to 65%, no RWMA.  He was not determined to be a good candidate for TEE/DCCV due to body habitus.  He was referred to the A-fib clinic and was transitioned from Eliquis  to Xarelto .  He underwent successful DCCV in 04/2023 with early recurrence of atrial.  He was last seen in the office on 05/08/2023 and was in rapid atrial flutter.  He was started on metoprolol .  He was hospitalized in March 2025 in the setting of right lower leg cellulitis, acute on chronic diastolic heart failure.   He was diuresed 12 L. He was referred to ID.    He presents today for follow-up.  Since his last visit and since his recent hospitalization  Atrial flutter: Chronic diastolic heart failure: Hypertension: Prediabetes: OSA: Superobesity: Disposition:   Current Outpatient Medications  Medication Sig Dispense Refill   acetaminophen  (TYLENOL ) 500 MG tablet Take 1,500 mg by mouth every 8 (eight) hours as needed for moderate pain (pain score 4-6).     diltiazem  (CARDIZEM  CD) 360 MG 24 hr capsule Take 1 capsule (360 mg total) by mouth daily. Appt needed for refill [id number] 30 capsule 0   Melatonin 10 MG TABS Take 10 mg by mouth at bedtime.     methylcellulose oral powder Take 1 packet by mouth daily. 30 ml in coffee or water     metoprolol  tartrate (LOPRESSOR ) 25 MG tablet Take 1 tablet (25 mg total) by mouth 2 (two) times daily. 180 tablet 3   potassium chloride  SA (KLOR-CON  M) 20 MEQ  tablet Take 1 tablet (20 mEq total) by mouth 2 (two) times daily. 30 tablet 0   torsemide  (DEMADEX ) 20 MG tablet Take 2 tablets (40 mg total) by mouth daily. 60 tablet 0   traMADol  (ULTRAM ) 50 MG tablet Take 1 tablet (50 mg total) by mouth every 6 (six) hours as needed. 30 tablet 0   XARELTO  20 MG TABS tablet TAKE 1 TABLET BY MOUTH DAILY WITH SUPPER. 30 tablet 4   No current facility-administered medications for this visit.     Review of Systems    ***.  All other systems reviewed and are otherwise negative except as noted above.    Physical Exam    VS:  There were no vitals taken for this visit. , BMI There is no height or weight on file to calculate BMI.     GEN: Well nourished, well developed, in no acute distress. HEENT: normal. Neck: Supple, no JVD, carotid bruits, or masses. Cardiac: RRR, no murmurs, rubs, or  gallops. No clubbing, cyanosis, edema.  Radials/DP/PT 2+ and equal bilaterally.  Respiratory:  Respirations regular and unlabored, clear to auscultation bilaterally. GI: Soft, nontender, nondistended, BS + x 4. MS: no deformity or atrophy. Skin: warm and dry, no rash. Neuro:  Strength and sensation are intact. Psych: Normal affect.  Accessory Clinical Findings    ECG personally reviewed by me today -    - no acute changes.   Lab Results  Component Value Date   WBC 8.8 07/13/2023   HGB 14.3 07/13/2023   HCT 46.8 07/13/2023   MCV 100.2 (H) 07/13/2023   PLT 230 07/13/2023   Lab Results  Component Value Date   CREATININE 0.79 07/13/2023   BUN 18 07/13/2023   NA 135 07/13/2023   K 4.3 07/13/2023   CL 94 (L) 07/13/2023   CO2 30 07/13/2023   Lab Results  Component Value Date   ALT 38 07/11/2023   AST 22 07/11/2023   ALKPHOS 53 07/11/2023   BILITOT 1.3 (H) 07/11/2023   Lab Results  Component Value Date   CHOL 126 05/30/2021   HDL 25 (L) 05/30/2021   LDLCALC 84 05/30/2021   TRIG 85 05/30/2021   CHOLHDL 5.0 05/30/2021    Lab Results  Component Value  Date   HGBA1C 6.0 (H) 03/25/2023    Assessment & Plan    1.  ***  No BP recorded.  {Refresh Note OR Click here to enter BP  :1}***   Jude Norton, NP 08/31/2023, 6:53 AM

## 2023-09-03 ENCOUNTER — Ambulatory Visit: Payer: Self-pay | Admitting: Orthopaedic Surgery

## 2023-09-08 ENCOUNTER — Ambulatory Visit: Attending: Pharmacist | Admitting: Pharmacist

## 2023-09-08 NOTE — Progress Notes (Deleted)
 Patient ID: Willie Steele                 DOB: 06-Nov-1980                    MRN: [id number]     HPI: Willie Steele is a 43 y.o. male patient referred to pharmacy clinic by Palmer Bobo to initiate GLP1-RA therapy. PMH is significant for HTN. CHF, A Flutter, and obesity. Most recent BMI 87.49.  Baseline weight and BMI: *** Current weight and BMI: *** Current meds that affect weight: ****  *** If diabetic and on insulin /sulfonylurea, can consider reducing dose to reduce risk of hypoglycemia  *** Follow-up visit  Assess % weight loss Assess adverse effects Missed doses  Diet:   Exercise:   Family History:   Social History:   Labs: Lab Results  Component Value Date   HGBA1C 6.0 (H) 03/25/2023    Wt Readings from Last 1 Encounters:  07/09/23 (!) 700 lb (317.5 kg)    BP Readings from Last 1 Encounters:  07/13/23 (!) 159/84   Pulse Readings from Last 1 Encounters:  07/13/23 99       Component Value Date/Time   CHOL 126 05/30/2021 0326   TRIG 85 05/30/2021 0326   HDL 25 (L) 05/30/2021 0326   CHOLHDL 5.0 05/30/2021 0326   VLDL 17 05/30/2021 0326   LDLCALC 84 05/30/2021 0326    Past Medical History:  Diagnosis Date   Abscess of right thigh    Chronic diastolic (congestive) heart failure (HCC)    Hypertension    Morbid obesity (HCC)     Current Outpatient Medications on File Prior to Visit  Medication Sig Dispense Refill   acetaminophen  (TYLENOL ) 500 MG tablet Take 1,500 mg by mouth every 8 (eight) hours as needed for moderate pain (pain score 4-6).     diltiazem  (CARDIZEM  CD) 360 MG 24 hr capsule Take 1 capsule (360 mg total) by mouth daily. Appt needed for refill [id number] 30 capsule 0   Melatonin 10 MG TABS Take 10 mg by mouth at bedtime.     methylcellulose oral powder Take 1 packet by mouth daily. 30 ml in coffee or water     metoprolol  tartrate (LOPRESSOR ) 25 MG tablet Take 1 tablet (25 mg total) by mouth 2 (two) times daily. 180 tablet 3    potassium chloride  SA (KLOR-CON  M) 20 MEQ tablet Take 1 tablet (20 mEq total) by mouth 2 (two) times daily. 30 tablet 0   torsemide  (DEMADEX ) 20 MG tablet Take 2 tablets (40 mg total) by mouth daily. 60 tablet 0   traMADol  (ULTRAM ) 50 MG tablet Take 1 tablet (50 mg total) by mouth every 6 (six) hours as needed. 30 tablet 0   XARELTO  20 MG TABS tablet TAKE 1 TABLET BY MOUTH DAILY WITH SUPPER. 30 tablet 4   No current facility-administered medications on file prior to visit.    Allergies  Allergen Reactions   Sulfa Antibiotics Hives and Rash     Assessment/Plan:  1. Weight loss - Patient has not met goal of at least 5% of body weight loss with comprehensive lifestyle modifications alone in the past 3-6 months. Pharmacotherapy is appropriate to pursue as augmentation. Will start ***. Confirmed patient not ***pregnant and no personal or family history of medullary thyroid carcinoma (MTC) or Multiple Endocrine Neoplasia syndrome type 2 (MEN 2). Injection technique reviewed at today's visit.  Advised patient on common side effects including nausea, diarrhea, dyspepsia, decreased  appetite, and fatigue. Counseled patient on reducing meal size and how to titrate medication to minimize side effects. Counseled patient to call if intolerable side effects or if experiencing dehydration, abdominal pain, or dizziness. Patient will adhere to dietary modifications and will target at least 150 minutes of moderate intensity exercise weekly.   Follow up in 1 month via telephone for tolerability update and dose titration.

## 2023-09-11 ENCOUNTER — Other Ambulatory Visit: Payer: Self-pay | Admitting: Physician Assistant

## 2023-09-14 ENCOUNTER — Encounter: Payer: Self-pay | Admitting: Physician Assistant

## 2023-09-14 ENCOUNTER — Ambulatory Visit (INDEPENDENT_AMBULATORY_CARE_PROVIDER_SITE_OTHER): Admitting: Physician Assistant

## 2023-09-14 DIAGNOSIS — M1712 Unilateral primary osteoarthritis, left knee: Secondary | ICD-10-CM

## 2023-09-14 DIAGNOSIS — M1711 Unilateral primary osteoarthritis, right knee: Secondary | ICD-10-CM

## 2023-09-14 DIAGNOSIS — M170 Bilateral primary osteoarthritis of knee: Secondary | ICD-10-CM

## 2023-09-14 MED ORDER — HYALURONAN 30 MG/2ML IX SOSY
30.0000 mg | PREFILLED_SYRINGE | INTRA_ARTICULAR | Status: AC | PRN
  Administered 2023-09-14: 30 mg via INTRA_ARTICULAR

## 2023-09-14 NOTE — Progress Notes (Signed)
   Procedure Note  Patient: Willie Steele             Date of Birth: June 21, 1980           MRN: [id number]             Visit Date: 09/14/2023 8 PI: Gonsalo returns today for viscosupplementation injections both knees.  He has known arthritis of both knees.  He is unable to do cortisone injections given his lower leg cellulitis.  He has no scheduled surgery on either knee in the next 6 months. He reports no new injury to either knee.  He uses a cane to ambulate.  He states that his left knee locked up at Friday he feels that he must of "slept weird".  Physical exam: Bilateral knees good range of motion.  No abnormal warmth erythema.  Procedures: Visit Diagnoses:  1. Primary osteoarthritis of left knee   2. Unilateral primary osteoarthritis, right knee     Large Joint Inj: bilateral knee on 09/14/2023 11:16 AM Indications: pain Details: 22 G 1.5 in needle, anterolateral approach  Arthrogram: No  Medications (Right): 30 mg Hyaluronan 30 MG/2ML Medications (Left): 30 mg Hyaluronan 30 MG/2ML Outcome: tolerated well, no immediate complications Procedure, treatment alternatives, risks and benefits explained, specific risks discussed. Consent was given by the patient. Immediately prior to procedure a time out was called to verify the correct patient, procedure, equipment, support staff and site/side marked as required. Patient was prepped and draped in the usual sterile fashion.      Plan: Will see him back in 1 week for his second of 3 bilateral Orthovisc injections.  Questions were encouraged and answered

## 2023-09-21 ENCOUNTER — Ambulatory Visit: Admitting: Orthopaedic Surgery

## 2023-09-26 ENCOUNTER — Other Ambulatory Visit (HOSPITAL_COMMUNITY): Payer: Self-pay | Admitting: Internal Medicine

## 2023-09-28 ENCOUNTER — Ambulatory Visit: Attending: Nurse Practitioner | Admitting: Nurse Practitioner

## 2023-09-28 ENCOUNTER — Ambulatory Visit: Admitting: Physician Assistant

## 2023-09-28 NOTE — Progress Notes (Deleted)
 Office Visit    Patient Name: Willie Steele Date of Encounter: 09/28/2023  Primary Care Provider:  Alfredia Ina, MD Primary Cardiologist:  Wendie Hamburg, MD  Chief Complaint    43 year old male with a history of chronic diastolic heart failure, atrial flutter, hypertension, prediabetes, OSA, and super obesity who presents for follow-up related to heart failure.  Past Medical History    Past Medical History:  Diagnosis Date   Abscess of right thigh    Chronic diastolic (congestive) heart failure (HCC)    Hypertension    Morbid obesity (HCC)    Past Surgical History:  Procedure Laterality Date   CARDIOVERSION N/A 05/01/2023   Procedure: CARDIOVERSION;  Surgeon: Elmyra Haggard, MD;  Location: [facility] INVASIVE CV LAB;  Service: Cardiovascular;  Laterality: N/A;    Allergies  Allergies  Allergen Reactions   Sulfa Antibiotics Hives and Rash     Labs/Other Studies Reviewed    The following studies were reviewed today:  Cardiac Studies & Procedures   ______________________________________________________________________________________________     ECHOCARDIOGRAM  ECHOCARDIOGRAM LIMITED 03/26/2023  Narrative ECHOCARDIOGRAM LIMITED REPORT    Patient Name:   Willie Steele Date of Exam: 03/26/2023 Medical Rec #:  [id number]     Height:       74.0 in Accession #:    [id number]    Weight:       650.0 lb Date of Birth:  11-27-1980     BSA:          3.587 m Patient Age:    42 years      BP:           174/128 mmHg Patient Gender: M             HR:           90 bpm. Exam Location:  Inpatient  Procedure: 2D Echo and Intracardiac Opacification Agent  Indications:    Aflutter/ Function  History:        Patient has prior history of Echocardiogram examinations, most recent 03/05/2023.  Sonographer:    Hersey Lorenzo RDCS Referring Phys: [id number] CHRISTOPHER L SCHUMANN  IMPRESSIONS   1. Limited echo for function with echo contrast. Even with contrast, acoustic  windows are very limited. Apical windows show normal LVEF without apparent wall motion abnormalities. Left ventricular ejection fraction, by estimation, is 60 to 65%. The left ventricle has normal function. The left ventricle has no regional wall motion abnormalities.  FINDINGS Left Ventricle: Limited echo for function with echo contrast. Even with contrast, acoustic windows are very limited. Apical windows show normal LVEF without apparent wall motion abnormalities. Left ventricular ejection fraction, by estimation, is 60 to 65%. The left ventricle has normal function. The left ventricle has no regional wall motion abnormalities. Definity  contrast agent was given IV to delineate the left ventricular endocardial borders.  Sheryle Donning MD Electronically signed by Sheryle Donning MD Signature Date/Time: 03/26/2023/7:15:08 PM    Final          ______________________________________________________________________________________________     Recent Labs: 03/25/2023: TSH 1.421 07/09/2023: B Natriuretic Peptide 108.1 07/11/2023: ALT 38 07/13/2023: BUN 18; Creatinine, Ser 0.79; Hemoglobin 14.3; Magnesium  2.2; Platelets 230; Potassium 4.3; Sodium 135  Recent Lipid Panel    Component Value Date/Time   CHOL 126 05/30/2021 0326   TRIG 85 05/30/2021 0326   HDL 25 (L) 05/30/2021 0326   CHOLHDL 5.0 05/30/2021 0326   VLDL 17 05/30/2021 0326   LDLCALC 84 05/30/2021 0326    History  of Present Illness    43 year old male with the above past medical history including chronic diastolic heart failure, atrial flutter, hypertension, prediabetes, OSA, and super obesity.  He was hospitalized in 05/2021 in the setting of acute diastolic heart failure.  Echocardiogram was nondiagnostic due to very poor echo windows and visualization of cardiac structures, however, based on limited views, EF appeared to be within normal range, EF 55 to 65%.  He was transition from furosemide  to torsemide .   He was hospitalized in 02/2023 in the setting of sepsis due to cellulitis, acute hypoxic respiratory failure, acute on chronic diastolic heart failure.  Repeat echocardiogram unable to assess LVEF or wall motion due to inability to visualize endocardial segments from poor acoustical windows, unable to optimally evaluate regional wall motion.  He was hospitalized in December 2024 in the setting of new onset atrial flutter with RVR.  Cardiology was consulted.  Repeat limited echo showed acoustic windows limited due to body habitus, LVEF 60 to 65%, no RWMA.  He was not determined to be a good candidate for TEE/DCCV due to body habitus.  He was referred to the A-fib clinic and was transitioned from Eliquis  to Xarelto .  He underwent successful DCCV in 04/2023 with early recurrence of atrial.  He was last seen in the office on 05/08/2023 and was in rapid atrial flutter.  He was started on metoprolol .  He was hospitalized in March 2025 in the setting of right lower leg cellulitis, acute on chronic diastolic heart failure.   He was diuresed 12 L. He was referred to ID.   He presents today for follow-up.  Since his last visit and since his recent hospitalization  Atrial flutter: Chronic diastolic heart failure: Hypertension: Prediabetes: OSA: Superobesity: Disposition:   Home Medications    Current Outpatient Medications  Medication Sig Dispense Refill   acetaminophen  (TYLENOL ) 500 MG tablet Take 1,500 mg by mouth every 8 (eight) hours as needed for moderate pain (pain score 4-6).     diltiazem  (CARDIZEM  CD) 360 MG 24 hr capsule Take 1 capsule (360 mg total) by mouth daily. Appt needed for refill [id number] 30 capsule 0   Melatonin 10 MG TABS Take 10 mg by mouth at bedtime.     methylcellulose oral powder Take 1 packet by mouth daily. 30 ml in coffee or water     metoprolol  tartrate (LOPRESSOR ) 25 MG tablet Take 1 tablet (25 mg total) by mouth 2 (two) times daily. 180 tablet 3   potassium chloride  SA  (KLOR-CON  M) 20 MEQ tablet Take 1 tablet (20 mEq total) by mouth 2 (two) times daily. 30 tablet 0   torsemide  (DEMADEX ) 20 MG tablet Take 2 tablets (40 mg total) by mouth daily. 60 tablet 0   traMADol  (ULTRAM ) 50 MG tablet TAKE 1 TABLET BY MOUTH EVERY 6 HOURS AS NEEDED. 28 tablet 1   XARELTO  20 MG TABS tablet TAKE 1 TABLET BY MOUTH DAILY WITH SUPPER. 30 tablet 4   No current facility-administered medications for this visit.     Review of Systems    ***.  All other systems reviewed and are otherwise negative except as noted above.    Physical Exam    VS:  There were no vitals taken for this visit. , BMI There is no height or weight on file to calculate BMI.     GEN: Well nourished, well developed, in no acute distress. HEENT: normal. Neck: Supple, no JVD, carotid bruits, or masses. Cardiac: RRR, no murmurs, rubs, or  gallops. No clubbing, cyanosis, edema.  Radials/DP/PT 2+ and equal bilaterally.  Respiratory:  Respirations regular and unlabored, clear to auscultation bilaterally. GI: Soft, nontender, nondistended, BS + x 4. MS: no deformity or atrophy. Skin: warm and dry, no rash. Neuro:  Strength and sensation are intact. Psych: Normal affect.  Accessory Clinical Findings    ECG personally reviewed by me today -    - no acute changes.   Lab Results  Component Value Date   WBC 8.8 07/13/2023   HGB 14.3 07/13/2023   HCT 46.8 07/13/2023   MCV 100.2 (H) 07/13/2023   PLT 230 07/13/2023   Lab Results  Component Value Date   CREATININE 0.79 07/13/2023   BUN 18 07/13/2023   NA 135 07/13/2023   K 4.3 07/13/2023   CL 94 (L) 07/13/2023   CO2 30 07/13/2023   Lab Results  Component Value Date   ALT 38 07/11/2023   AST 22 07/11/2023   ALKPHOS 53 07/11/2023   BILITOT 1.3 (H) 07/11/2023   Lab Results  Component Value Date   CHOL 126 05/30/2021   HDL 25 (L) 05/30/2021   LDLCALC 84 05/30/2021   TRIG 85 05/30/2021   CHOLHDL 5.0 05/30/2021    Lab Results  Component Value  Date   HGBA1C 6.0 (H) 03/25/2023    Assessment & Plan    1.  ***  No BP recorded.  {Refresh Note OR Click here to enter BP  :1}***   Jude Norton, NP 09/28/2023, 5:31 AM

## 2023-10-05 ENCOUNTER — Telehealth: Payer: Self-pay | Admitting: Cardiology

## 2023-10-05 ENCOUNTER — Ambulatory Visit: Admitting: Physician Assistant

## 2023-10-05 MED ORDER — DILTIAZEM HCL ER COATED BEADS 360 MG PO CP24: 360.0000 mg | ORAL_CAPSULE | Freq: Every day | ORAL | 2 refills | Status: AC

## 2023-10-05 NOTE — Telephone Encounter (Signed)
*  STAT* If patient is at the pharmacy, call can be transferred to refill team.   1. Which medications need to be refilled? (please list name of each medication and dose if known)   diltiazem  (CARDIZEM  CD) 360 MG 24 hr capsule    2. Which pharmacy/location (including street and city if local pharmacy) is medication to be sent to?  [id number] - [place], [place] - [address] AT [address]      3. Do they need a 30 day or 90 day supply? 90 day    Pt is out of medication and has appt scheduled for July

## 2023-10-05 NOTE — Telephone Encounter (Signed)
 RX sent to requested Pharmacy

## 2023-10-09 ENCOUNTER — Other Ambulatory Visit: Payer: Self-pay | Admitting: Physician Assistant

## 2023-10-14 ENCOUNTER — Telehealth: Payer: Self-pay | Admitting: Radiology

## 2023-10-14 NOTE — Telephone Encounter (Signed)
 Spoke with pharmacy staff. Stated needed prior auth and diagnosis codes.  Patient has paid cash for Rx the last several times for medication and at this point prior auth will be needed if this is long term. [facility] will no longer take [facility] for any narcotics, prior auths with diagnosis codes will be needed in future.   Also documented on [facility] staff side, 2 notifications for our office had been sent and received. I did advise that unfortunately Dr. Russell team have not actually received these forms and I was trying to find out what was needed. She did allow me to give diagnosis codes over phone this time.

## 2023-10-15 ENCOUNTER — Other Ambulatory Visit: Payer: Self-pay | Admitting: Orthopaedic Surgery

## 2023-10-15 MED ORDER — TRAMADOL HCL 50 MG PO TABS: 50.0000 mg | ORAL_TABLET | Freq: Four times a day (QID) | ORAL | 1 refills | Status: DC | PRN

## 2023-10-16 NOTE — Progress Notes (Deleted)
  Cardiology Office Note   Date:  10/16/2023  ID:  Willie Steele, DOB 1981-01-06, MRN [id number] PCP: Willie Lenis, MD  [facility] Providers Cardiologist:  Willie LITTIE Nanas, MD { Click to update primary MD,subspecialty MD or APP then REFRESH:1}    History of Present Illness Willie Steele is a 43 y.o. male with a past medical history of atrial flutter status post cardioversion here for follow-up appointment.  He was seen back in January and was without major cardiac complaint. EKG shows 2:1 conduction atrial flutter with RVR heart rate 124.  He is unaware of his arrhythmia and has no complaints of irregular heart rates, tachycardia, or palpitations.  Chronic SOB/DOE which he attributes to his obesity and denies any worsening or exacerbation.  Unsure of the exact moment that he converted back in atrial flutter.  Noted his heart rates over the past week at home are generally in 70s to 80s.  He attributed his initial heart rate elevation to deconditioning and weight.  Has been attempting to begin a GLP-1 therapy however having issues with his insurance.  At that time denied chest pain, fatigue, palpitation, diaphoresis, lower extremity edema, weakness, syncope, presyncope, orthopnea, and PND.  Today, she***   ROS: ***  Studies Reviewed      *** Risk Assessment/Calculations {Does this patient have ATRIAL FIBRILLATION?:[phone]} No BP recorded.  {Refresh Note OR Click here to enter BP  :1}***       Physical Exam VS:  There were no vitals taken for this visit.       Wt Readings from Last 3 Encounters:  07/09/23 (!) 700 lb (317.5 kg)  05/08/23 (!) 700 lb (317.5 kg)  04/09/23 (!) 642 lb (291.2 kg)    GEN: Well nourished, well developed in no acute distress NECK: No JVD; No carotid bruits CARDIAC: ***RRR, no murmurs, rubs, gallops RESPIRATORY:  Clear to auscultation without rales, wheezing or rhonchi  ABDOMEN: Soft, non-tender, non-distended EXTREMITIES:  No edema; No  deformity   ASSESSMENT AND PLAN Atrial flutter Hypertension Acute on chronic HFpEF OSA Obesity    {Are you ordering a CV Procedure (e.g. stress test, cath, DCCV, TEE, etc)?   Press F2        :[id number]}  Dispo: ***  Signed, Willie LOISE Fabry, PA-C

## 2023-10-21 ENCOUNTER — Ambulatory Visit: Admitting: Physician Assistant

## 2023-10-26 ENCOUNTER — Ambulatory Visit: Admitting: Physician Assistant

## 2023-10-26 DIAGNOSIS — I5031 Acute diastolic (congestive) heart failure: Secondary | ICD-10-CM

## 2023-10-26 DIAGNOSIS — G4733 Obstructive sleep apnea (adult) (pediatric): Secondary | ICD-10-CM

## 2023-10-26 DIAGNOSIS — I4892 Unspecified atrial flutter: Secondary | ICD-10-CM

## 2023-10-26 DIAGNOSIS — I10 Essential (primary) hypertension: Secondary | ICD-10-CM

## 2023-11-02 ENCOUNTER — Ambulatory Visit: Admitting: Physician Assistant

## 2023-11-08 NOTE — Progress Notes (Deleted)
  Cardiology Office Note   Date:  11/08/2023  ID:  Willie Steele, DOB April 04, 1981, MRN [id number] PCP: Pura Lenis, MD  [facility] Providers Cardiologist:  Lonni LITTIE Nanas, MD { Click to update primary MD,subspecialty MD or APP then REFRESH:1}    History of Present Illness Willie Steele is a 43 y.o. male with a past medical history of atrial flutter status post cardioversion here for follow-up appointment.  He was seen back in January and was without major cardiac complaint. EKG shows 2:1 conduction atrial flutter with RVR heart rate 124.  He is unaware of his arrhythmia and has no complaints of irregular heart rates, tachycardia, or palpitations.  Chronic SOB/DOE which he attributes to his obesity and denies any worsening or exacerbation.  Unsure of the exact moment that he converted back in atrial flutter.  Noted his heart rates over the past week at home are generally in 70s to 80s.  He attributed his initial heart rate elevation to deconditioning and weight.  Has been attempting to begin a GLP-1 therapy however having issues with his insurance.  At that time denied chest pain, fatigue, palpitation, diaphoresis, lower extremity edema, weakness, syncope, presyncope, orthopnea, and PND.  Today, she***   ROS: ***  Studies Reviewed      *** Risk Assessment/Calculations {Does this patient have ATRIAL FIBRILLATION?[phone]} No BP recorded.  {Refresh Note OR Click here to enter BP  :1}***       Physical Exam VS:  There were no vitals taken for this visit.       Wt Readings from Last 3 Encounters:  07/09/23 (!) 700 lb (317.5 kg)  05/08/23 (!) 700 lb (317.5 kg)  04/09/23 (!) 642 lb (291.2 kg)    GEN: Well nourished, well developed in no acute distress NECK: No JVD; No carotid bruits CARDIAC: ***RRR, no murmurs, rubs, gallops RESPIRATORY:  Clear to auscultation without rales, wheezing or rhonchi  ABDOMEN: Soft, non-tender, non-distended EXTREMITIES:  No edema; No  deformity   ASSESSMENT AND PLAN Atrial flutter Hypertension Acute on chronic HFpEF OSA Obesity    {Are you ordering a CV Procedure (e.g. stress test, cath, DCCV, TEE, etc)?   Press F2        :[id number]}  Dispo: ***  Signed, Orren LOISE Fabry, PA-C

## 2023-11-11 ENCOUNTER — Ambulatory Visit: Attending: Cardiology | Admitting: Physician Assistant

## 2023-11-11 DIAGNOSIS — G4733 Obstructive sleep apnea (adult) (pediatric): Secondary | ICD-10-CM

## 2023-11-11 DIAGNOSIS — I5031 Acute diastolic (congestive) heart failure: Secondary | ICD-10-CM

## 2023-11-11 DIAGNOSIS — I10 Essential (primary) hypertension: Secondary | ICD-10-CM

## 2023-11-11 DIAGNOSIS — I4892 Unspecified atrial flutter: Secondary | ICD-10-CM

## 2023-11-26 ENCOUNTER — Ambulatory Visit: Admitting: Physician Assistant

## 2023-11-29 ENCOUNTER — Other Ambulatory Visit: Payer: Self-pay | Admitting: Orthopaedic Surgery

## 2023-12-21 ENCOUNTER — Ambulatory Visit: Admitting: Physician Assistant

## 2023-12-28 ENCOUNTER — Ambulatory Visit: Admitting: Physician Assistant

## 2023-12-29 ENCOUNTER — Ambulatory Visit: Admitting: Physician Assistant

## 2023-12-29 NOTE — Progress Notes (Deleted)
 Cardiology Office Note   Date:  12/29/2023  ID:  Willie Steele, DOB 12/11/1980, MRN [id number] PCP: Pura Lenis, MD  [facility] Providers Cardiologist:  Willie LITTIE Nanas, MD   History of Present Illness Willie Steele is a 43 y.o. male with a past medical history of atrial flutter status post DCCV and morbid obesity here for follow-up appointment.  Was last seen May 08, 2023 and at that time did not have any major cardiovascular concerns or complaints.  EKG showed 2-1 conduction atrial flutter with RVR with rate 124 bpm.  He was unaware of his arrhythmia no complaints of irregular heartbeats, tachycardia, and palpitations.  Chronic SOB/DOE which he attributes to his obesity and he denied any worsening of his shortness of breath or exacerbation.  Unsure of the exact moment where he converted into atrial flutter.  He did note that his heart rates at home over the past week have generally been in the 70s 80s.  He attributed his initial heart rate elevation to his deconditioning and weight.  He had been attempting to begin GLP-1 therapy however is having issues with his insurance.  At that visit he denied chest pain, lower extremity edema, fatigue, palpitations, diaphoresis, weakness, syncope/presyncope, orthopnea, and PND.  Today, he***  ROS: Pertinent ROS in HPI  Studies Reviewed      Echocardiogram 03/26/23  1. Limited echo for function with echo contrast. Even with contrast, acoustic windows are very limited. Apical windows show normal LVEF without apparent wall motion abnormalities. Left ventricular ejection fraction, by estimation, is 60 to 65%. The left  ventricle has normal function. The left ventricle has no regional wall  motion abnormalities.   Echocardiogram 03/05/2023 1. Cannot assess LVF or wall motion due to inability to visualize  endocardial segments from poor acoustical windows. Left ventricular  endocardial border not optimally defined to evaluate  regional wall motion.  Left ventricular diastolic function could  not be evaluated.   2. Right ventricular systolic function was not well visualized. The right  ventricular size is not well visualized. There is normal pulmonary artery  systolic pressure. The estimated right ventricular systolic pressure is  26.2 mmHg.   3. The mitral valve is normal in structure. No evidence of mitral valve  regurgitation. No evidence of mitral stenosis.   4. The aortic valve was not well visualized. Aortic valve regurgitation  is not visualized.   5. The inferior vena cava is normal in size with greater than 50%  respiratory variability, suggesting right atrial pressure of 3 mmHg.    Risk Assessment/Calculations {Does this patient have ATRIAL FIBRILLATION?:[phone]} No BP recorded.  {Refresh Note OR Click here to enter BP  :1}***       Physical Exam VS:  There were no vitals taken for this visit.       Wt Readings from Last 3 Encounters:  07/09/23 (!) 700 lb (317.5 kg)  05/08/23 (!) 700 lb (317.5 kg)  04/09/23 (!) 642 lb (291.2 kg)    GEN: Well nourished, well developed in no acute distress NECK: No JVD; No carotid bruits CARDIAC: ***RRR, no murmurs, rubs, gallops RESPIRATORY:  Clear to auscultation without rales, wheezing or rhonchi  ABDOMEN: Soft, non-tender, non-distended EXTREMITIES:  No edema; No deformity   ASSESSMENT AND PLAN Atrial flutter status post successful cardioversion 05/01/2023 -He was seen a few days later and was back in 2-1 atrial flutter with RVR therefore metoprolol  tartrate 25 mg twice daily was added.  Encouraged to continue Xarelto  20 mg  daily and diltiazem  360 mg daily.  Plan was to follow-up in the A-fib clinic in a week and consider another DCCV versus initiation of antiarrhythmic therapy.  He was determined to not be a good candidate for amiodarone therapy given age and BMI.  Hypertension Acute on chronic HFpEF OSA Obesity    {Are you ordering a CV Procedure  (e.g. stress test, cath, DCCV, TEE, etc)?   Press F2        :[id number]}  Dispo: ***  Signed, Orren LOISE Fabry, PA-C

## 2023-12-31 ENCOUNTER — Ambulatory Visit: Admitting: Physician Assistant

## 2024-01-04 ENCOUNTER — Ambulatory Visit: Admitting: Physician Assistant

## 2024-01-17 ENCOUNTER — Other Ambulatory Visit: Payer: Self-pay | Admitting: Surgical

## 2024-01-18 NOTE — Telephone Encounter (Signed)
Gil patient 

## 2024-01-18 NOTE — Progress Notes (Deleted)
 Cardiology Office Note   Date:  01/18/2024  ID:  Willie Steele, DOB 03/13/1981, MRN [id number] PCP: Pura Lenis, MD  [facility] Providers Cardiologist:  Lonni LITTIE Nanas, MD   History of Present Illness Willie Steele is a 43 y.o. male with a past medical history of atrial flutter status post DCCV and morbid obesity here for follow-up appointment.  Was last seen May 08, 2023 and at that time did not have any major cardiovascular concerns or complaints.  EKG showed 2-1 conduction atrial flutter with RVR with rate 124 bpm.  He was unaware of his arrhythmia no complaints of irregular heartbeats, tachycardia, and palpitations.  Chronic SOB/DOE which he attributes to his obesity and he denied any worsening of his shortness of breath or exacerbation.  Unsure of the exact moment where he converted into atrial flutter.  He did note that his heart rates at home over the past week have generally been in the 70s 80s.  He attributed his initial heart rate elevation to his deconditioning and weight.  He had been attempting to begin GLP-1 therapy however is having issues with his insurance.  At that visit he denied chest pain, lower extremity edema, fatigue, palpitations, diaphoresis, weakness, syncope/presyncope, orthopnea, and PND.  Today, he***  ROS: Pertinent ROS in HPI  Studies Reviewed      Echocardiogram 03/26/23  1. Limited echo for function with echo contrast. Even with contrast, acoustic windows are very limited. Apical windows show normal LVEF without apparent wall motion abnormalities. Left ventricular ejection fraction, by estimation, is 60 to 65%. The left  ventricle has normal function. The left ventricle has no regional wall  motion abnormalities.   Echocardiogram 03/05/2023 1. Cannot assess LVF or wall motion due to inability to visualize  endocardial segments from poor acoustical windows. Left ventricular  endocardial border not optimally defined to evaluate  regional wall motion.  Left ventricular diastolic function could  not be evaluated.   2. Right ventricular systolic function was not well visualized. The right  ventricular size is not well visualized. There is normal pulmonary artery  systolic pressure. The estimated right ventricular systolic pressure is  26.2 mmHg.   3. The mitral valve is normal in structure. No evidence of mitral valve  regurgitation. No evidence of mitral stenosis.   4. The aortic valve was not well visualized. Aortic valve regurgitation  is not visualized.   5. The inferior vena cava is normal in size with greater than 50%  respiratory variability, suggesting right atrial pressure of 3 mmHg.    Risk Assessment/Calculations {Does this patient have ATRIAL FIBRILLATION?:[phone]} No BP recorded.  {Refresh Note OR Click here to enter BP  :1}***       Physical Exam VS:  There were no vitals taken for this visit.       Wt Readings from Last 3 Encounters:  07/09/23 (!) 700 lb (317.5 kg)  05/08/23 (!) 700 lb (317.5 kg)  04/09/23 (!) 642 lb (291.2 kg)    GEN: Well nourished, well developed in no acute distress NECK: No JVD; No carotid bruits CARDIAC: ***RRR, no murmurs, rubs, gallops RESPIRATORY:  Clear to auscultation without rales, wheezing or rhonchi  ABDOMEN: Soft, non-tender, non-distended EXTREMITIES:  No edema; No deformity   ASSESSMENT AND PLAN Atrial flutter status post successful cardioversion 05/01/2023 -He was seen a few days later and was back in 2-1 atrial flutter with RVR therefore metoprolol  tartrate 25 mg twice daily was added.  Encouraged to continue Xarelto  20 mg  daily and diltiazem  360 mg daily.  Plan was to follow-up in the A-fib clinic in a week and consider another DCCV versus initiation of antiarrhythmic therapy.  He was determined to not be a good candidate for amiodarone therapy given age and BMI.  Hypertension Acute on chronic HFpEF OSA Obesity    {Are you ordering a CV Procedure  (e.g. stress test, cath, DCCV, TEE, etc)?   Press F2        :[id number]}  Dispo: ***  Signed, Olivia Pavy, PA-C

## 2024-01-19 ENCOUNTER — Other Ambulatory Visit: Payer: Self-pay | Admitting: Physician Assistant

## 2024-01-19 MED ORDER — TRAMADOL HCL 50 MG PO TABS: 50.0000 mg | ORAL_TABLET | Freq: Four times a day (QID) | ORAL | 0 refills | Status: AC | PRN

## 2024-01-25 ENCOUNTER — Ambulatory Visit: Attending: Physician Assistant | Admitting: Physician Assistant

## 2024-01-26 ENCOUNTER — Other Ambulatory Visit (HOSPITAL_COMMUNITY): Payer: Self-pay | Admitting: Internal Medicine

## 2024-01-27 NOTE — Telephone Encounter (Signed)
 Prescription refill request for Xarelto  received.  Indication:afib Last office visit:1/25 Weight:317.5  kg Age:43 Scr:0.79  3/25 CrCl:547.03  ml/min  Prescription refilled

## 2024-02-10 ENCOUNTER — Ambulatory Visit: Admitting: Physician Assistant

## 2024-02-15 ENCOUNTER — Encounter: Payer: Self-pay | Admitting: Radiology

## 2024-05-08 ENCOUNTER — Other Ambulatory Visit: Payer: Self-pay | Admitting: Emergency Medicine
